# Patient Record
Sex: Female | Born: 2000
Health system: Southern US, Community
[De-identification: ages and names within clinical notes are randomized; demographics above are authoritative.]

## PROBLEM LIST (undated history)

## (undated) DIAGNOSIS — T7840XA Allergy, unspecified, initial encounter: Secondary | ICD-10-CM

## (undated) DIAGNOSIS — F329 Major depressive disorder, single episode, unspecified: Secondary | ICD-10-CM

## (undated) DIAGNOSIS — E282 Polycystic ovarian syndrome: Secondary | ICD-10-CM

## (undated) DIAGNOSIS — F419 Anxiety disorder, unspecified: Secondary | ICD-10-CM

## (undated) DIAGNOSIS — F32A Depression, unspecified: Secondary | ICD-10-CM

## (undated) HISTORY — DX: Allergy, unspecified, initial encounter: T78.40XA

## (undated) HISTORY — DX: Anxiety disorder, unspecified: F41.9

## (undated) HISTORY — DX: Polycystic ovarian syndrome: E28.2

## (undated) HISTORY — PX: WISDOM TOOTH EXTRACTION: SHX21

## (undated) HISTORY — DX: Depression, unspecified: F32.A

## (undated) HISTORY — DX: Major depressive disorder, single episode, unspecified: F32.9

---
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
## 2018-01-02 ENCOUNTER — Ambulatory Visit (INDEPENDENT_AMBULATORY_CARE_PROVIDER_SITE_OTHER): Payer: 59 | Admitting: Psychiatry

## 2018-01-02 ENCOUNTER — Encounter (HOSPITAL_COMMUNITY): Payer: Self-pay | Admitting: Psychiatry

## 2018-01-02 VITALS — BP 118/70 | HR 80 | Ht 66.0 in | Wt 181.0 lb

## 2018-01-02 DIAGNOSIS — F411 Generalized anxiety disorder: Secondary | ICD-10-CM

## 2018-01-02 DIAGNOSIS — Z818 Family history of other mental and behavioral disorders: Secondary | ICD-10-CM | POA: Diagnosis not present

## 2018-01-02 DIAGNOSIS — F900 Attention-deficit hyperactivity disorder, predominantly inattentive type: Secondary | ICD-10-CM

## 2018-01-02 DIAGNOSIS — F332 Major depressive disorder, recurrent severe without psychotic features: Secondary | ICD-10-CM | POA: Diagnosis not present

## 2018-01-02 DIAGNOSIS — Z87891 Personal history of nicotine dependence: Secondary | ICD-10-CM

## 2018-01-02 DIAGNOSIS — Z813 Family history of other psychoactive substance abuse and dependence: Secondary | ICD-10-CM

## 2018-01-02 MED ORDER — LISDEXAMFETAMINE DIMESYLATE 30 MG PO CAPS: ORAL_CAPSULE | ORAL | 0 refills | Status: DC

## 2018-01-02 MED FILL — VYVANSE 30 MG CAPSULE: 30 | 30 days supply | Qty: 30 | Fill #0

## 2018-01-02 NOTE — Progress Notes (Signed)
[facility] MD/PA/NP OP Progress Note  01/02/2018 11:23 AM Cheryl [name]  MRN:  [id number]  Chief Complaint: doing pretty good  HPI: Cheryl [name] reports that she has been more consistent with her Prozac and Vyvanse and her mood does seem to be better.  She is focusing the summer on working on enjoying her relationships, self-care, exercise, journaling.  She has noticed that the withdrawal anxiety at the end of the day is much less when she takes the Vyvanse more consistently on a day-to-day basis.  No acute safety issues, no cutting, no purging, no self injury.  We agreed to follow-up in 8 weeks and continue the current medication regimen as prescribed.  Visit Diagnosis:    ICD-10-CM   1. Severe episode of recurrent major depressive disorder, without psychotic features ([place]) F33.2   2. GAD (generalized anxiety disorder) F41.1   3. Attention deficit hyperactivity disorder (ADHD), predominantly inattentive type F90.0     Past Psychiatric History: See intake H&P for full details. Reviewed, with no updates at this time.   Past Medical History:  Past Medical History:  Diagnosis Date  . Allergy   . Anxiety   . Depression   . PCOS (polycystic ovarian syndrome) 11/2016    Past Surgical History:  Procedure Laterality Date  . WISDOM TOOTH EXTRACTION    . WISDOM TOOTH EXTRACTION      Family Psychiatric History: See intake H&P for full details. Reviewed, with no updates at this time.   Family History:  Family History  Problem Relation Age of Onset  . Depression Mother   . Anxiety disorder Mother   . Hyperlipidemia Mother   . Hypertension Mother   . Hypertension Father   . Depression Maternal Uncle   . Hypertension Maternal Grandmother   . Hypertension Maternal Grandfather   . Drug abuse Maternal Grandfather     Social History:  Social History   Socioeconomic History  . Marital status: Single    Spouse name: n/a  . Number of children: 0  . Years of education: Not on file  .  Highest education level: High school graduate  Occupational History  . 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 Financial resource strain: Not on file  . Food insecurity:    Worry: Never true    Inability: Never true  . Transportation needs:    Medical: No    Non-medical: No  Tobacco Use  . Smoking status: Former Smoker    Last attempt to quit: 10/21/2017    Years since quitting: 0.2  . Smokeless tobacco: Never Used  Substance and Sexual Activity  . Alcohol use: No    Alcohol/week: 0.0 oz  . Drug use: No  . Sexual activity: Never  Lifestyle  . Physical activity:    Days per week: 1 day    Minutes per session: 30 min  . Stress: Rather much  Relationships  . Social connections:    Talks on phone: More than three times a week    Gets together: More than three times a week    Attends religious service: More than 4 times per year    Active member of club or organization: Yes    Attends meetings of clubs or organizations: More than 4 times per year    Relationship status: Never married  Other Topics Concern  . Not on file  Social History Narrative  Lives her mom mostly. Parents are divorced.    Allergies: No Known Allergies  Metabolic Disorder Labs: No results found for: HGBA1C, MPG Lab Results  Component Value Date   PROLACTIN 7.6 11/15/2016   No results found for: CHOL, TRIG, HDL, CHOLHDL, VLDL, LDLCALC Lab Results  Component Value Date   TSH 2.58 11/15/2016    Therapeutic Level Labs: No results found for: LITHIUM No results found for: VALPROATE No components found for:  CBMZ  Current Medications: Current Outpatient Medications  Medication Sig Dispense Refill  . EPINEPHrine 0.3 mg/0.3 mL IJ SOAJ injection   1  . FLUoxetine (PROZAC) 40 MG capsule Take 1 capsule (40 mg total) by mouth daily. 90 capsule 0  . lisdexamfetamine (VYVANSE) 30 MG capsule Take one each morning 90 capsule 0  .  norgestimate-ethinyl estradiol (ORTHO-CYCLEN,SPRINTEC,PREVIFEM) 0.25-35 MG-MCG tablet Take 25 tablets by mouth daily.  12  . traZODone (DESYREL) 50 MG tablet Take 1 or 2 each evening 90 tablet 1  . metFORMIN (GLUCOPHAGE-XR) 500 MG 24 hr tablet Take 1 tablet (500 mg total) by mouth 2 (two) times daily. (Patient not taking: Reported on 10/21/2017) 60 tablet 5  . mupirocin cream (BACTROBAN) 2 % Apply 1 application topically 2 (two) times daily. (Patient not taking: Reported on 10/21/2017) 15 g 0   No current facility-administered medications for this visit.     Musculoskeletal: Strength & Muscle Tone: within normal limits Gait & Station: normal Patient leans: N/A  Psychiatric Specialty Exam: ROS  Blood pressure 118/70, pulse 80, height 5\' 6"  (1.676 m), weight 181 lb (82.1 kg).Body mass index is 29.21 kg/m.  General Appearance: Casual and Well Groomed  Eye Contact:  Good  Speech:  Clear and Coherent and Normal Rate  Volume:  Normal  Mood:  Euthymic  Affect:  Appropriate and Congruent  Thought Process:  Goal Directed and Descriptions of Associations: Intact  Orientation:  Full (Time, Place, and Person)  Thought Content: Logical   Suicidal Thoughts:  No  Homicidal Thoughts:  No  Memory:  Immediate;   Fair  Judgement:  Fair  Insight:  Shallow  Psychomotor Activity:  Normal  Concentration:  Concentration: Good  Recall:  Good  Fund of Knowledge: Good  Language: Good  Akathisia:  Negative  Handed:  Right  AIMS (if indicated): not done  Assets:  Communication Skills Desire for Improvement Financial Resources/Insurance Housing Physical Health Social Support Transportation Vocational/Educational  ADL's:  Intact  Cognition: WNL  Sleep:  Good   Screenings: PHQ2-9     Office Visit from 11/15/2016 in [facility] Office Visit from 09/08/2015 in [facility] Visit from 05/19/2015 in [facility] Counselor from 03/22/2015 in [place] Office Visit from 03/17/2015 in [facility]  PHQ-2 Total Score  0  0  0  4  5  PHQ-9 Total Score  0  -  1  16  14        Assessment and Plan:  Cheryl [name] presents with improved mood and ADHD symptoms, anxiety is well controlled when she is taking the medications more consistently.  No acute safety issues and we will follow-up in 8 weeks or sooner if needed.  1. Severe episode of recurrent major depressive disorder, without psychotic features ([place])   2. GAD (generalized anxiety disorder)   3. Attention deficit hyperactivity disorder (ADHD), predominantly inattentive type     Status of current problems: unchanged  Labs Ordered: No orders of the defined types were placed in this encounter.   Labs Reviewed: n/a  Collateral Obtained/Records Reviewed: Viewed prior psychiatric notes  Plan:  Continue Vyvanse 30 mg daily Continue Prozac 40 mg daily Trazodone as needed for sleep  Aundra Dubin, MD 01/02/2018, 11:23 AM

## 2018-01-10 MED FILL — FEMYNOR 0.25-35 MG-MCG TABS: 0.25-35 | 28 days supply | Qty: 28 | Fill #1

## 2018-01-22 ENCOUNTER — Ambulatory Visit (HOSPITAL_COMMUNITY): Payer: Self-pay | Admitting: Licensed Clinical Social Worker

## 2018-01-22 ENCOUNTER — Encounter

## 2018-02-10 ENCOUNTER — Encounter (HOSPITAL_COMMUNITY): Payer: Self-pay | Admitting: Licensed Clinical Social Worker

## 2018-02-10 ENCOUNTER — Ambulatory Visit (INDEPENDENT_AMBULATORY_CARE_PROVIDER_SITE_OTHER): Payer: 59 | Admitting: Licensed Clinical Social Worker

## 2018-02-10 ENCOUNTER — Encounter

## 2018-02-10 DIAGNOSIS — F411 Generalized anxiety disorder: Secondary | ICD-10-CM | POA: Diagnosis not present

## 2018-02-10 DIAGNOSIS — F332 Major depressive disorder, recurrent severe without psychotic features: Secondary | ICD-10-CM | POA: Diagnosis not present

## 2018-02-10 DIAGNOSIS — F900 Attention-deficit hyperactivity disorder, predominantly inattentive type: Secondary | ICD-10-CM | POA: Diagnosis not present

## 2018-02-10 NOTE — Progress Notes (Signed)
   THERAPIST PROGRESS NOTE  Session Time: 9-10  Participation Level: Active  Behavioral Response: Casual and Well GroomedAlertAnxious and Euthymic  Type of Therapy: Individual Therapy  Treatment Goals addressed: Anxiety  Interventions: CBT  Summary: AIRIKA ALKHATIB is a 17 y.o. female who presents with GAD and hx of depression. Subjective: "I want to work on my consistency... I can be extremely up and then down w/ my energy. I'm a type 6 on Enneagram."  Pt made strong eye contact, w/ blunted affect, and euthymic mood throughout session. She was sarcastic, joking, and self deprecating in session. She was unaware of what she wanted to work on but she stated she "did not want to cry", which she admitted was easy for her to do. Counselor spent time asking about pt's school choices, friend choices, and Enneagram personality type 6, which she stated was very important to her. Pt discussed a recent exchange w/ a friend who did not check on her when pt did not attend a social gathering on Saturday to go go-karting. Pt admitted this was somewhat hurtful to her and she felt "inadequate". Counselor asked pt to decide on 1 goal for therapy. Pt and counselor made goal of increasing boundaries for taking care of self.  Suicidal/Homicidal: Nowithout intent/plan  Therapist Response: Counselor used CBT to help pt identify her thoughts that trigger anxiety. Counselor validated pt's long standing hx of depressive and anxious thinking. Pt appears to have strong insight into her dysfunctional thinking though she lacks the skills to implement serene thinking and tolerate distress of social anxiety.  Plan: Return again in 2 weeks.  Diagnosis:    ICD-10-CM   1. Severe episode of recurrent major depressive disorder, without psychotic features ([place]) F33.2   2. GAD (generalized anxiety disorder) F41.1   3. Attention deficit hyperactivity disorder (ADHD), predominantly inattentive type F90.0       Archie Balboa, LCAS-A 02/10/2018

## 2018-02-24 MED FILL — FLUoxetine HCL 40 MG CAPS: 40 | 30 days supply | Qty: 30 | Fill #0

## 2018-02-28 ENCOUNTER — Ambulatory Visit: Payer: 59 | Admitting: Physician Assistant

## 2018-02-28 ENCOUNTER — Other Ambulatory Visit: Payer: Self-pay

## 2018-02-28 ENCOUNTER — Encounter: Payer: Self-pay | Admitting: Physician Assistant

## 2018-02-28 VITALS — BP 118/80 | HR 74 | Temp 97.6°F | Resp 15 | Ht 65.75 in | Wt 181.8 lb

## 2018-02-28 DIAGNOSIS — H6983 Other specified disorders of Eustachian tube, bilateral: Secondary | ICD-10-CM

## 2018-02-28 MED ORDER — FEXOFENADINE-PSEUDOEPHED ER 180-240 MG PO TB24: 1.0000 | ORAL_TABLET | Freq: Every day | ORAL | 0 refills | Status: DC

## 2018-02-28 MED ORDER — FLUTICASONE PROPIONATE 50 MCG/ACT NA SUSP: 2.0000 | Freq: Every day | NASAL | 6 refills | Status: DC

## 2018-02-28 MED FILL — VYVANSE 30 MG CAPSULE: 30 | 30 days supply | Qty: 30 | Fill #0

## 2018-02-28 MED FILL — FLUTICASONE PROP 50 MCG SPR: 50 | 30 days supply | Qty: 16 | Fill #0

## 2018-02-28 NOTE — Progress Notes (Signed)
Patient presents to clinic today c/o 4 days of progressively worsening muffled hearing, ear pressure and ear pain. Denies sinus pain but notes some mild nasal congestion. Denies chest congestion or cough. Denies fever, chills. Has significant history of ear infections. Denies recent travel or sick contact. R is worse than L. Has not taken anything for symptoms.    Past Medical History:  Diagnosis Date  . Allergy   . Anxiety   . Depression   . PCOS (polycystic ovarian syndrome) 11/2016    Current Outpatient Medications on File Prior to Visit  Medication Sig Dispense Refill  . EPINEPHrine 0.3 mg/0.3 mL IJ SOAJ injection   1  . FLUoxetine (PROZAC) 40 MG capsule Take 1 capsule (40 mg total) by mouth daily. 90 capsule 0  . lisdexamfetamine (VYVANSE) 30 MG capsule Take one each morning 90 capsule 0  . mupirocin cream (BACTROBAN) 2 % Apply 1 application topically 2 (two) times daily. 15 g 0  . norgestimate-ethinyl estradiol (ORTHO-CYCLEN,SPRINTEC,PREVIFEM) 0.25-35 MG-MCG tablet Take 25 tablets by mouth daily.  12  . metFORMIN (GLUCOPHAGE-XR) 500 MG 24 hr tablet Take 1 tablet (500 mg total) by mouth 2 (two) times daily. (Patient not taking: Reported on 10/21/2017) 60 tablet 5  . traZODone (DESYREL) 50 MG tablet Take 1 or 2 each evening (Patient not taking: Reported on 02/28/2018) 90 tablet 1   No current facility-administered medications on file prior to visit.     No Known Allergies  Family History  Problem Relation Age of Onset  . Depression Mother   . Anxiety disorder Mother   . Hyperlipidemia Mother   . Hypertension Mother   . Hypertension Father   . Depression Maternal Uncle   . Hypertension Maternal Grandmother   . Hypertension Maternal Grandfather   . Drug abuse Maternal Grandfather     Social History   Socioeconomic History  . Marital status: Single    Spouse name: n/a  . Number of children: 0  . Years of education: Not on file  . Highest education level: High school  graduate  Occupational History  . 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 Financial resource strain: Not on file  . Food insecurity:    Worry: Never true    Inability: Never true  . Transportation needs:    Medical: No    Non-medical: No  Tobacco Use  . Smoking status: Former Smoker    Last attempt to quit: 10/21/2017    Years since quitting: 0.3  . Smokeless tobacco: Never Used  Substance and Sexual Activity  . Alcohol use: No    Alcohol/week: 0.0 standard drinks  . Drug use: No  . Sexual activity: Never  Lifestyle  . Physical activity:    Days per week: 1 day    Minutes per session: 30 min  . Stress: Rather much  Relationships  . Social connections:    Talks on phone: More than three times a week    Gets together: More than three times a week    Attends religious service: More than 4 times per year    Active member of club or organization: Yes    Attends meetings of clubs or organizations: More than 4 times per year    Relationship status: Never married  Other Topics Concern  . Not on file  Social History Narrative   Lives her mom mostly. Parents are divorced.  Review of Systems - See HPI.  All other ROS are negative.  BP 118/80   Pulse 74   Temp 97.6 F (36.4 C) (Oral)   Resp 15   Ht 5' 5.75" (1.67 m)   Wt 181 lb 12.8 oz (82.5 kg)   SpO2 98%   BMI 29.57 kg/m   Physical Exam  Constitutional: She appears well-developed and well-nourished.  HENT:  Head: Normocephalic and atraumatic.  Right Ear: External ear normal. Tympanic membrane is retracted.  Left Ear: External ear normal. Tympanic membrane is retracted.  Nose: Nose normal.  Mouth/Throat: Oropharynx is clear and moist.  Eyes: Conjunctivae are normal.  Neck: Neck supple.  Cardiovascular: Normal rate, regular rhythm, normal heart sounds and intact distal pulses.  Pulmonary/Chest: Effort normal and breath sounds normal. No  respiratory distress.  Psychiatric: She has a normal mood and affect.  Vitals reviewed.  Assessment/Plan: 1. Eustachian tube dysfunction, bilateral Moderate retraction bilaterally. No noted effusion. Patient afebrile. Will start antihistamine with decongestant, as well as nasal steroid. Supportive measures reviewed. Follow-up if not resolving.   - fluticasone (FLONASE) 50 MCG/ACT nasal spray; Place 2 sprays into both nostrils daily.  Dispense: 16 g; Refill: 6 - fexofenadine-pseudoephedrine (ALLEGRA-D ALLERGY & CONGESTION) 180-240 MG 24 hr tablet; Take 1 tablet by mouth daily.  Dispense: 20 tablet; Refill: 0   Leeanne Rio, PA-C

## 2018-02-28 NOTE — Patient Instructions (Signed)
No sign of infection today but you ear drums are really retracted. Start the Allegra-D and Flonase as directed. Symptoms should slowly resolve. If not improving by Monday or anything worsens, please call or come see [place].

## 2018-03-20 ENCOUNTER — Ambulatory Visit (HOSPITAL_COMMUNITY): Payer: Self-pay | Admitting: Licensed Clinical Social Worker

## 2018-04-02 DIAGNOSIS — Z6829 Body mass index (BMI) 29.0-29.9, adult: Secondary | ICD-10-CM | POA: Diagnosis not present

## 2018-04-02 DIAGNOSIS — Z01419 Encounter for gynecological examination (general) (routine) without abnormal findings: Secondary | ICD-10-CM | POA: Diagnosis not present

## 2018-04-03 ENCOUNTER — Ambulatory Visit (INDEPENDENT_AMBULATORY_CARE_PROVIDER_SITE_OTHER): Payer: 59 | Admitting: Licensed Clinical Social Worker

## 2018-04-03 ENCOUNTER — Encounter (HOSPITAL_COMMUNITY): Payer: Self-pay | Admitting: Licensed Clinical Social Worker

## 2018-04-03 DIAGNOSIS — F332 Major depressive disorder, recurrent severe without psychotic features: Secondary | ICD-10-CM

## 2018-04-03 DIAGNOSIS — F411 Generalized anxiety disorder: Secondary | ICD-10-CM

## 2018-04-03 NOTE — Progress Notes (Signed)
   THERAPIST PROGRESS NOTE  Session Time: 10-11  Participation Level: Active  Behavioral Response: Casual and Well GroomedAlertEuthymic  Type of Therapy: Individual Therapy  Treatment Goals addressed: Anxiety and Coping  Interventions: CBT  Summary: Cheryl [name] is a 17 y.o. female who presents with MDD and GAD.  "I don't understand my emotions, like, how to process them. I'm good w/ the extremes of sad or happy but not the in between ones."  Pt was active, engaged in session. She laid down on couch horizontally and stated she wanted to "do therapy like the classic way". She reported on her distress that she has yet to establish a major, though she is leaning towards Risk analyst. She has decided she will no longer pursue social work or Physicist, medical. Pt and counselor discuss pt's emotional states and how she can learn to allow herself to process more varied emotions than just depressed or happy. Counselor presents pt w/ handout on emotional states and cognitive therapy. Counselor challenges pt to consider that "she cannot make someone else feel a certain way." Pt has recently found a new friend group and she feels "happier than she ever has" w/ these friends, though the guilt of leaving others out keeps her from fully enjoying it.   Suicidal/Homicidal: Nowithout intent/plan  Therapist Response: Counselor used open questions, active listening, and supportive reframing. Counselor used brief psychoeducation to inform pt of her inability to "cause" others to feel certain things. Counselor utilized handouts and cognitive model of therapy to educate and build insight into pt's behavioral and emotional link to her underlying beliefs about a situation.   Plan: Return again in 4 weeks.  Diagnosis:    ICD-10-CM   1. Severe episode of recurrent major depressive disorder, without psychotic features ([place]) F33.2   2. GAD (generalized anxiety disorder) F41.1        Archie Balboa,  LCAS-A 04/03/2018

## 2018-04-17 ENCOUNTER — Encounter (HOSPITAL_COMMUNITY): Payer: Self-pay | Admitting: Licensed Clinical Social Worker

## 2018-04-17 ENCOUNTER — Ambulatory Visit (INDEPENDENT_AMBULATORY_CARE_PROVIDER_SITE_OTHER): Payer: 59 | Admitting: Licensed Clinical Social Worker

## 2018-04-17 DIAGNOSIS — F411 Generalized anxiety disorder: Secondary | ICD-10-CM | POA: Diagnosis not present

## 2018-04-17 DIAGNOSIS — F332 Major depressive disorder, recurrent severe without psychotic features: Secondary | ICD-10-CM

## 2018-04-17 NOTE — Progress Notes (Signed)
   THERAPIST PROGRESS NOTE  Session Time: 10-11  Participation Level: Active  Behavioral Response: CasualAlertAnxious  Type of Therapy: Individual Therapy  Treatment Goals addressed: Anxiety  Interventions: CBT and Supportive  Summary: Cheryl [name] is a 17 y.o. female who presents with GAD and MDD.   "I'm going to [place] this weekend w/ some friends and I am so worried about my emotions getting in the way of me having fun."  Pt is active, engaged, and talkative in session. She allows for significant moments of silence and appears to gain deep insight into her problematic self judgment, inner critic, and insecurity. She reports on her problems w/ feeling that her friends will accept her. Counselor provides pt with a "feelings wheel" to help pt grow her emotional vocabulary.   Suicidal/Homicidal: Nowithout intent/plan  Therapist Response: Counselor used open questions, active listening, emotional support, and emotional validation. Counselor helped pt increase her emotional vocabulary and stay w/ her feelings of insecurity w/o distracting herself w/ intellectualizing.   Plan: Return again in 2 weeks.  Diagnosis:    ICD-10-CM   1. Severe episode of recurrent major depressive disorder, without psychotic features ([place]) F33.2   2. GAD (generalized anxiety disorder) F41.1        Archie Balboa, LCAS-A 04/17/2018

## 2018-05-14 ENCOUNTER — Encounter (HOSPITAL_COMMUNITY): Payer: Self-pay | Admitting: Licensed Clinical Social Worker

## 2018-05-14 ENCOUNTER — Ambulatory Visit (INDEPENDENT_AMBULATORY_CARE_PROVIDER_SITE_OTHER): Payer: 59 | Admitting: Licensed Clinical Social Worker

## 2018-05-14 DIAGNOSIS — F332 Major depressive disorder, recurrent severe without psychotic features: Secondary | ICD-10-CM

## 2018-05-14 DIAGNOSIS — F411 Generalized anxiety disorder: Secondary | ICD-10-CM | POA: Diagnosis not present

## 2018-05-14 DIAGNOSIS — F900 Attention-deficit hyperactivity disorder, predominantly inattentive type: Secondary | ICD-10-CM | POA: Diagnosis not present

## 2018-05-14 NOTE — Progress Notes (Signed)
   THERAPIST PROGRESS NOTE  Session Time: 3-4pm  Participation Level: Active  Behavioral Response: Casual and NeatAlertAnxious  Type of Therapy: Individual Therapy  Treatment Goals addressed: Anxiety  Interventions: CBT  Summary: Cheryl [name] is a 17 y.o. female who presents with GAD and hx of MDD and ADHD.  Pt is engaged and alert in session. She is lucid, joking, and somewhat nervous as she lays down on the couch. Pt reports she "feels like she is going to cry in today's session." Counselor and pt discuss pt's somatic experience of anxiety about possibly crying. Pt discusses her ongoing conflict w/ her roommates. Pt feels she is "the outsider" roommate who does not spend enough time w/ the other roommates. Counselor and pt discuss ways to challenge automatic negative thoughts.    Suicidal/Homicidal: Nowithout intent/plan  Therapist Response: Counselor helped pt identify and challenge automatic negative core beliefs. Counselor helped pt articulate ways to challenge irrational "shoulds" in her life. Counselor encouraged pt for her positive trip to [facility] and skills she used to manage anxiety of [place].  Plan: Return again in 2 weeks.  Diagnosis:    ICD-10-CM   1. Severe episode of recurrent major depressive disorder, without psychotic features ([place]) F33.2   2. GAD (generalized anxiety disorder) F41.1   3. Attention deficit hyperactivity disorder (ADHD), predominantly inattentive type F90.0       Archie Balboa, LCAS-A 05/14/2018

## 2018-05-26 ENCOUNTER — Ambulatory Visit (INDEPENDENT_AMBULATORY_CARE_PROVIDER_SITE_OTHER): Payer: 59 | Admitting: Licensed Clinical Social Worker

## 2018-05-26 DIAGNOSIS — F332 Major depressive disorder, recurrent severe without psychotic features: Secondary | ICD-10-CM | POA: Diagnosis not present

## 2018-05-26 DIAGNOSIS — F411 Generalized anxiety disorder: Secondary | ICD-10-CM | POA: Diagnosis not present

## 2018-05-30 ENCOUNTER — Encounter (HOSPITAL_COMMUNITY): Payer: Self-pay | Admitting: Licensed Clinical Social Worker

## 2018-05-30 NOTE — Progress Notes (Signed)
   THERAPIST PROGRESS NOTE  Session Time: 10-11  Participation Level: Active  Behavioral Response: Well GroomedAlertEuthymic  Type of Therapy: Individual Therapy  Treatment Goals addressed: Anxiety  Interventions: CBT and Meditation: Brainspotting  Summary: Cheryl [name] is a 17 y.o. female who presents with MDD and GAD.  She is engaged, calm, alert in session. Mood is labile w/ some tearfulness. She reports her relationships w/ roommates and friends are improving. She states she is practicing mindful breathing and noticing her thoughts and feelings w/o acting on them. Pt and counselor scan pt's field of vision using Brainspotting-style processing technique. Pt states she feels agitated and insecure when looking down. Pt discussed feeling different since kindergarden and comparing herself to her older sisters who had better relationships w/ their teachers.   Suicidal/Homicidal: Nowithout intent/plan  Therapist Response: Counselor used open questions, active listening, and supportive encouragement. Counselor congratulated pt on accomplishments of utilizing mindfulness skills that she practiced in previous sessions. Counselor used an adapted form of brainspotting to deepen pt's somatic awareness. Pt responded positively to this but initially appeared agitated.   Plan: Return again in 2 weeks.  Diagnosis:    ICD-10-CM   1. Severe episode of recurrent major depressive disorder, without psychotic features ([place]) F33.2   2. GAD (generalized anxiety disorder) F41.1       Archie Balboa, LCAS-A 05/30/2018

## 2018-06-16 ENCOUNTER — Ambulatory Visit (INDEPENDENT_AMBULATORY_CARE_PROVIDER_SITE_OTHER): Payer: 59 | Admitting: Licensed Clinical Social Worker

## 2018-06-16 DIAGNOSIS — F411 Generalized anxiety disorder: Secondary | ICD-10-CM | POA: Diagnosis not present

## 2018-06-16 DIAGNOSIS — F332 Major depressive disorder, recurrent severe without psychotic features: Secondary | ICD-10-CM

## 2018-06-17 ENCOUNTER — Encounter (HOSPITAL_COMMUNITY): Payer: Self-pay | Admitting: Licensed Clinical Social Worker

## 2018-06-17 NOTE — Progress Notes (Signed)
   THERAPIST PROGRESS NOTE  Session Time: 10-11  Participation Level: Active  Behavioral Response: Well GroomedAlertEuthymic    Type of Therapy: Individual Therapy  Treatment Goals addressed: Anxiety  Interventions: CBT  Summary: Cheryl [name] is a 17 y.o. female who presents with MDD and GAD.  She presents as calm, alert, she makes good eye contact, and appears comfortable. She spends majority of sessions discussing her interpersonal problems w/ a new boyfriend and guilt she feels since he has been with someone else the last few months. Pt feels that she may have had an "emotional affair". Counselor reframes pt's meaning-making of events. Pt leaves feeling relieved. Pt continues to do well interpersonally and academically.   Suicidal/Homicidal: Nowithout intent/plan  Therapist Response: Counselor used open questions, active listening, and CBT reframing. Counselor helped pt challenge automatic negative thoughts. Pt continues to struggle w/ excessive guilt from "others judging her and making people feel bad". She is working on individualizing her spiritual journey and admits she is learning to listen to God, more than her friends opinions.   Plan: Return again in 2 weeks.  Diagnosis:    ICD-10-CM   1. Severe episode of recurrent major depressive disorder, without psychotic features ([place]) F33.2   2. GAD (generalized anxiety disorder) F41.1       Archie Balboa, LCAS-A 06/17/2018

## 2018-07-02 MED FILL — VYVANSE 30 MG CAPSULE: 30 | 30 days supply | Qty: 30 | Fill #0

## 2018-07-02 MED FILL — FLUoxetine HCL 40 MG CAPS: 40 | 30 days supply | Qty: 30 | Fill #0

## 2018-07-02 MED FILL — NUVARING VAGINAL RING: 0.12-0.015 | 28 days supply | Qty: 1 | Fill #0

## 2018-07-16 DIAGNOSIS — Z23 Encounter for immunization: Secondary | ICD-10-CM | POA: Diagnosis not present

## 2018-08-04 MED FILL — FLUoxetine HCL 40 MG CAPS: 40 | 30 days supply | Qty: 30 | Fill #1

## 2018-08-04 MED FILL — NUVARING VAGINAL RING: 0.12-0.015 | 28 days supply | Qty: 1 | Fill #1

## 2018-08-05 MED FILL — VYVANSE 30 MG CAPSULE: 30 | 30 days supply | Qty: 30 | Fill #0

## 2018-08-22 MED FILL — CONCERTA 18 MG TABLET ER: 18 | 30 days supply | Qty: 30 | Fill #0

## 2018-09-02 MED FILL — FLUoxetine HCL 40 MG CAPS: 40 | 30 days supply | Qty: 30 | Fill #2

## 2018-09-02 MED FILL — NUVARING VAGINAL RING: 0.12-0.015 | 28 days supply | Qty: 1 | Fill #2

## 2018-09-11 DIAGNOSIS — M25562 Pain in left knee: Secondary | ICD-10-CM | POA: Diagnosis not present

## 2018-09-11 DIAGNOSIS — M25362 Other instability, left knee: Secondary | ICD-10-CM | POA: Diagnosis not present

## 2018-09-22 MED FILL — FLUoxetine HCL 40 MG CAPS: 40 | 30 days supply | Qty: 30 | Fill #1

## 2018-09-22 MED FILL — CONCERTA 18 MG TABLET ER: 18 | 30 days supply | Qty: 30 | Fill #0

## 2018-10-23 DIAGNOSIS — M25562 Pain in left knee: Secondary | ICD-10-CM | POA: Diagnosis not present

## 2018-10-27 DIAGNOSIS — M25562 Pain in left knee: Secondary | ICD-10-CM | POA: Diagnosis not present

## 2018-11-06 DIAGNOSIS — M25562 Pain in left knee: Secondary | ICD-10-CM | POA: Diagnosis not present

## 2018-11-07 MED FILL — CONCERTA ER 27 MG TABLET: 27 | 30 days supply | Qty: 30 | Fill #0

## 2018-11-07 MED FILL — NUVARING VAGINAL RING: 0.12-0.015 | 28 days supply | Qty: 1 | Fill #3

## 2018-11-11 DIAGNOSIS — M25562 Pain in left knee: Secondary | ICD-10-CM | POA: Diagnosis not present

## 2018-12-05 MED FILL — CONCERTA ER 27 MG TABLET: 27 | 30 days supply | Qty: 30 | Fill #0

## 2018-12-05 MED FILL — NUVARING VAGINAL RING: 0.12-0.015 | 28 days supply | Qty: 1 | Fill #4

## 2019-01-07 ENCOUNTER — Telehealth: Payer: Self-pay | Admitting: Family Medicine

## 2019-01-07 NOTE — Telephone Encounter (Signed)
Called in to follow up about a EKG order from her Psychiatrist? Pt says that she was told by Dr that she would be getting contacted by PCP office to schedule?   Would like to know if it is time for her to have a visit with PCP?   CB: [phone]

## 2019-01-08 NOTE — Telephone Encounter (Signed)
Called pt to verify what EKG is needed to be completed for? Also to advise pt would need an in office appt with PCP as she has not been seen in 2 years. VM was full.

## 2019-01-08 NOTE — Telephone Encounter (Signed)
I have not seen any orders and pt would need to schedule a visit w/ me as I have not seen her since May 2018

## 2019-01-08 NOTE — Telephone Encounter (Signed)
Have you received any paperwork? Pt has not seen you since 2018

## 2019-01-09 NOTE — Telephone Encounter (Signed)
Called pt again this morning and VM was still full.

## 2019-01-09 NOTE — Telephone Encounter (Signed)
I spoke with patient regarding her request for an appt for an EKG. Patient stated that Dr. Sharyon Medicus with [facility] is wanting an EKG performed due to her resting heart rate being too high. I informed patient that there were no orders in her chart for an EKG. I went ahead and scheduled patient for a routine f/u with Dr. Birdie Riddle since patient has not been seen in over two years. Informed patient to reach out to Dr. Sharyon Medicus office to have orders placed for EKG.

## 2019-01-16 ENCOUNTER — Ambulatory Visit: Payer: 59 | Admitting: Family Medicine

## 2019-01-16 ENCOUNTER — Other Ambulatory Visit: Payer: Self-pay

## 2019-01-16 ENCOUNTER — Encounter: Payer: Self-pay | Admitting: Family Medicine

## 2019-01-16 VITALS — BP 112/78 | HR 104 | Temp 98.1°F | Resp 16 | Ht 66.0 in | Wt 168.4 lb

## 2019-01-16 DIAGNOSIS — F331 Major depressive disorder, recurrent, moderate: Secondary | ICD-10-CM

## 2019-01-16 DIAGNOSIS — R000 Tachycardia, unspecified: Secondary | ICD-10-CM

## 2019-01-16 NOTE — Patient Instructions (Signed)
We'll call you with your Cardiology appt We'll notify you of your lab results and make any changes if needed Please ask Psych to decrease your Concerta to your previous dose to see if that makes a difference Try and limit your sugar and caffeine If you again develop chest tightness, dizziness, or shortness of breath- please go to the ER Call with any questions or concerns Hang in there!!

## 2019-01-16 NOTE — Progress Notes (Signed)
   Subjective:    Patient ID: Cheryl [name], female    DOB: 19-Jan-2001, 18 y.o.   MRN: [id number]  HPI Tachycardia- pt reports she will 'get random high heart rate notifications'.  Over the last month has 'noticed it more'.  Concerta was recently increased.  HR in PT was up to 186- she got dizzy, some shortness of breath.  At times will develop chest tightness.  Pt is on Concerta but has never had tachycardia w/ stimulants in the past.  Pt reports HR will elevate even on days she doesn't take Concerta.  Not currently on decongestant.  Denies recent stressors.  In Spring of 2019 she was not able to finish an exercise class due to chest pain but has not had trouble until again recently.  Pt denies redness, flushing, sweating.   Review of Systems For ROS see HPI     Objective:   Physical Exam Vitals signs reviewed.  Constitutional:      General: She is not in acute distress.    Appearance: She is well-developed.  HENT:     Head: Normocephalic and atraumatic.  Eyes:     Conjunctiva/sclera: Conjunctivae normal.     Pupils: Pupils are equal, round, and reactive to light.  Neck:     Musculoskeletal: Normal range of motion and neck supple.     Thyroid: No thyromegaly.  Cardiovascular:     Rate and Rhythm: Regular rhythm. Tachycardia present.     Heart sounds: Normal heart sounds. No murmur.  Pulmonary:     Effort: Pulmonary effort is normal. No respiratory distress.     Breath sounds: Normal breath sounds.  Lymphadenopathy:     Cervical: No cervical adenopathy.  Skin:    General: Skin is warm and dry.  Neurological:     Mental Status: She is alert and oriented to person, place, and time.  Psychiatric:        Behavior: Behavior normal.           Assessment & Plan:  Tachycardia- pt reports she had 1 episode last spring (that was not evaluated) but was relatively asymptomatic until ~1 month ago.  She was started on Concerta this spring and dose was recently increased.  EKG WNL  w/ exception of HR 102.  It is unclear if her sxs are stimulant related but the fact that she is having HR of 140 while at rest, watching TV is unusual.  Will check labs to r/o metabolic cause and refer to Cards for complete evaluation.  Pt expressed understanding and is in agreement w/ plan.

## 2019-01-17 LAB — CBC WITH DIFFERENTIAL/PLATELET
Absolute Monocytes: 537 cells/uL (ref 200–950)
Basophils Absolute: 47 cells/uL (ref 0–200)
Basophils Relative: 0.6 %
Eosinophils Absolute: 63 cells/uL (ref 15–500)
Eosinophils Relative: 0.8 %
HCT: 41 % (ref 35.0–45.0)
Hemoglobin: 13.3 g/dL (ref 11.7–15.5)
Lymphs Abs: 1991 cells/uL (ref 850–3900)
MCH: 27 pg (ref 27.0–33.0)
MCHC: 32.4 g/dL (ref 32.0–36.0)
MCV: 83.2 fL (ref 80.0–100.0)
MPV: 12 fL (ref 7.5–12.5)
Monocytes Relative: 6.8 %
Neutro Abs: 5261 cells/uL (ref 1500–7800)
Neutrophils Relative %: 66.6 %
Platelets: 257 10*3/uL (ref 140–400)
RBC: 4.93 10*6/uL (ref 3.80–5.10)
RDW: 13.2 % (ref 11.0–15.0)
Total Lymphocyte: 25.2 %
WBC: 7.9 10*3/uL (ref 3.8–10.8)

## 2019-01-17 LAB — BASIC METABOLIC PANEL
BUN: 12 mg/dL (ref 7–20)
CO2: 19 mmol/L — ABNORMAL LOW (ref 20–32)
Calcium: 9.3 mg/dL (ref 8.9–10.4)
Chloride: 105 mmol/L (ref 98–110)
Creat: 0.85 mg/dL (ref 0.50–1.00)
Glucose, Bld: 96 mg/dL (ref 65–99)
Potassium: 3.9 mmol/L (ref 3.8–5.1)
Sodium: 139 mmol/L (ref 135–146)

## 2019-01-17 LAB — TSH: TSH: 2.74 mIU/L

## 2019-01-30 ENCOUNTER — Encounter: Payer: Self-pay | Admitting: *Deleted

## 2019-04-07 MED FILL — FLUoxetine HCL 40 MG CAPS: 40 | 30 days supply | Qty: 30 | Fill #0

## 2019-04-07 MED FILL — NUVARING VAGINAL RING: 0.12-0.015 | 28 days supply | Qty: 1 | Fill #0

## 2019-04-13 ENCOUNTER — Encounter: Payer: Self-pay | Admitting: Cardiology

## 2019-04-13 ENCOUNTER — Other Ambulatory Visit: Payer: Self-pay

## 2019-04-13 ENCOUNTER — Ambulatory Visit (INDEPENDENT_AMBULATORY_CARE_PROVIDER_SITE_OTHER): Payer: 59 | Admitting: Cardiology

## 2019-04-13 ENCOUNTER — Other Ambulatory Visit (INDEPENDENT_AMBULATORY_CARE_PROVIDER_SITE_OTHER): Payer: 59

## 2019-04-13 VITALS — BP 124/88 | HR 87 | Ht 66.0 in | Wt 177.0 lb

## 2019-04-13 DIAGNOSIS — R002 Palpitations: Secondary | ICD-10-CM

## 2019-04-13 NOTE — Progress Notes (Signed)
Cardiology Office Note:    Date:  04/13/2019   ID:  Cheryl [name], DOB July 06, 2000, MRN [id number]  PCP:  Midge Minium, MD  Cardiologist:  Jenean Lindau, MD   Referring MD: Midge Minium, MD    ASSESSMENT:    1. Palpitations    PLAN:    In order of problems listed above:  1. Palpitations: I reassured the patient about my findings.  It is possible that the medication contributed to her palpitations but she is already off the medication and her heart rate is much better.  Her recent TSH was unremarkable and to evaluate this we will do a 3-day ZIO monitor. 2. Echocardiogram will be done to assess murmur heard on auscultation.  I reassured the patient about my findings.  She knows to go to the nearest emergency room for any concerning symptoms. 3. Patient will be seen in follow-up appointment in 6 months or earlier if the patient has any concerns    Medication Adjustments/Labs and Tests Ordered: Current medicines are reviewed at length with the patient today.  Concerns regarding medicines are outlined above.  Orders Placed This Encounter  Procedures  . LONG TERM MONITOR (3-14 DAYS)  . ECHOCARDIOGRAM COMPLETE   No orders of the defined types were placed in this encounter.    History of Present Illness:    Cheryl [name] is a 18 y.o. female who is being seen today for the evaluation of palpitations and elevated heart rate at the request of Birdie Riddle, Aundra Millet, MD.  Patient is a pleasant 18 year old female.  She has no significant past medical history except polycystic ovarian disease and depression.  She was initiated on a medicine called Concerta and she mentions to me that her heart rate was elevated subsequently.  Then this medication was discontinued and she feels much better at this time.  No chest pain orthopnea or PND.  At the time of my evaluation, the patient is alert awake oriented and in no distress.  She is a very active lady.  Past Medical History:   Diagnosis Date  . Allergy   . Anxiety   . Depression   . PCOS (polycystic ovarian syndrome) 11/2016    Past Surgical History:  Procedure Laterality Date  . WISDOM TOOTH EXTRACTION    . WISDOM TOOTH EXTRACTION      Current Medications: Current Meds  Medication Sig  . EPINEPHrine 0.3 mg/0.3 mL IJ SOAJ injection   . fexofenadine-pseudoephedrine (ALLEGRA-D ALLERGY & CONGESTION) 180-240 MG 24 hr tablet Take 1 tablet by mouth daily.  . fluticasone (FLONASE) 50 MCG/ACT nasal spray Place 2 sprays into both nostrils daily.  Marland Kitchen NUVARING 0.12-0.015 MG/24HR vaginal ring      Allergies:   Patient has no known allergies.   Social History   Socioeconomic History  . Marital status: Single    Spouse name: n/a  . Number of children: 0  . Years of education: Not on file  . Highest education level: High school graduate  Occupational History  . Occupation: Lexicographer:  minor    Comment: [place] [employer], mining at [employer]  . Financial resource strain: Not on file  . Food insecurity    Worry: Never true    Inability: Never true  . Transportation needs    Medical: No    Non-medical: No  Tobacco Use  . Smoking status: Former Smoker    Quit date: 10/21/2017    Years  since quitting: 1.4  . Smokeless tobacco: Never Used  Substance and Sexual Activity  . Alcohol use: No    Alcohol/week: 0.0 standard drinks  . Drug use: No  . Sexual activity: Never  Lifestyle  . Physical activity    Days per week: 1 day    Minutes per session: 30 min  . Stress: Rather much  Relationships  . Social connections    Talks on phone: More than three times a week    Gets together: More than three times a week    Attends religious service: More than 4 times per year    Active member of club or organization: Yes    Attends meetings of clubs or organizations: More than 4 times per year    Relationship status: Never married  Other Topics Concern  . Not on file  Social History  Narrative   Lives her mom mostly. Parents are divorced.     Family History: The patient's family history includes Anxiety disorder in her mother; Depression in her maternal uncle and mother; Drug abuse in her maternal grandfather; Hyperlipidemia in her mother; Hypertension in her father, maternal grandfather, maternal grandmother, and mother.  ROS:   Please see the history of present illness.    All other systems reviewed and are negative.  EKGs/Labs/Other Studies Reviewed:    The following studies were reviewed today: EKG reveals sinus rhythm and nonspecific ST-T changes.   Recent Labs: 01/16/2019: BUN 12; Creat 0.85; Hemoglobin 13.3; Platelets 257; Potassium 3.9; Sodium 139; TSH 2.74  Recent Lipid Panel No results found for: CHOL, TRIG, HDL, CHOLHDL, VLDL, LDLCALC, LDLDIRECT  Physical Exam:    VS:  BP 124/88 (BP Location: Left Arm, Patient Position: Sitting, Cuff Size: Normal)   Pulse 87   Ht 5\' 6"  (1.676 m)   Wt 177 lb (80.3 kg)   SpO2 98%   BMI 28.57 kg/m     Wt Readings from Last 3 Encounters:  04/13/19 177 lb (80.3 kg)  01/16/19 168 lb 6 oz (76.4 kg) (91 %, Z= 1.34)*  02/28/18 181 lb 12.8 oz (82.5 kg) (95 %, Z= 1.66)*   * Growth percentiles are based on CDC (Girls, 2-20 Years) data.     GEN: Patient is in no acute distress HEENT: Normal NECK: No JVD; No carotid bruits LYMPHATICS: No lymphadenopathy CARDIAC: S1 S2 regular, 2/6 systolic murmur at the apex. RESPIRATORY:  Clear to auscultation without rales, wheezing or rhonchi  ABDOMEN: Soft, non-tender, non-distended MUSCULOSKELETAL:  No edema; No deformity  SKIN: Warm and dry NEUROLOGIC:  Alert and oriented x 3 PSYCHIATRIC:  Normal affect    Signed, Jenean Lindau, MD  04/13/2019 2:21 PM    [facility]

## 2019-04-13 NOTE — Patient Instructions (Signed)
Medication Instructions:  Your physician recommends that you continue on your current medications as directed. Please refer to the Current Medication list given to you today.  *If you need a refill on your cardiac medications before your next appointment, please call your pharmacy*  Lab Work: NONE If you have labs (blood work) drawn today and your tests are completely normal, you will receive your results only by: Marland Kitchen MyChart Message (if you have MyChart) OR . A paper copy in the mail If you have any lab test that is abnormal or we need to change your treatment, we will call you to review the results.  Testing/Procedures: Your physician has recommended that you wear a ZIO monitor.ZIO monitors are medical devices that record the heart's electrical activity. Doctors most often use these monitors to diagnose arrhythmias. Arrhythmias are problems with the speed or rhythm of the heartbeat. The monitor is a small, portable device. You can wear one while you do your normal daily activities. This is usually used to diagnose what is causing palpitations/syncope (passing out).You will wear this device for 3 days  Follow-Up: At [facility], you and your health needs are our priority.  As part of our continuing mission to provide you with exceptional heart care, we have created designated Provider Care Teams.  These Care Teams include your primary Cardiologist (physician) and Advanced Practice Providers (APPs -  Physician Assistants and Nurse Practitioners) who all work together to provide you with the care you need, when you need it.  Your next appointment:   6 months  The format for your next appointment:   In Person  Provider:   Jyl Heinz, MD  Other Instructions  Echocardiogram An echocardiogram is a procedure that uses painless sound waves (ultrasound) to produce an image of the heart. Images from an echocardiogram can provide important information about:  Signs of coronary artery  disease (CAD).  Aneurysm detection. An aneurysm is a weak or damaged part of an artery wall that bulges out from the normal force of blood pumping through the body.  Heart size and shape. Changes in the size or shape of the heart can be associated with certain conditions, including heart failure, aneurysm, and CAD.  Heart muscle function.  Heart valve function.  Signs of a past heart attack.  Fluid buildup around the heart.  Thickening of the heart muscle.  A tumor or infectious growth around the heart valves. Tell a health care provider about:  Any allergies you have.  All medicines you are taking, including vitamins, herbs, eye drops, creams, and over-the-counter medicines.  Any blood disorders you have.  Any surgeries you have had.  Any medical conditions you have.  Whether you are pregnant or may be pregnant. What are the risks? Generally, this is a safe procedure. However, problems may occur, including:  Allergic reaction to dye (contrast) that may be used during the procedure. What happens before the procedure? No specific preparation is needed. You may eat and drink normally. What happens during the procedure?   An IV tube may be inserted into one of your veins.  You may receive contrast through this tube. A contrast is an injection that improves the quality of the pictures from your heart.  A gel will be applied to your chest.  A wand-like tool (transducer) will be moved over your chest. The gel will help to transmit the sound waves from the transducer.  The sound waves will harmlessly bounce off of your heart to allow the heart  images to be captured in real-time motion. The images will be recorded on a computer. The procedure may vary among health care providers and hospitals. What happens after the procedure?  You may return to your normal, everyday life, including diet, activities, and medicines, unless your health care provider tells you not to do that.  Summary  An echocardiogram is a procedure that uses painless sound waves (ultrasound) to produce an image of the heart.  Images from an echocardiogram can provide important information about the size and shape of your heart, heart muscle function, heart valve function, and fluid buildup around your heart.  You do not need to do anything to prepare before this procedure. You may eat and drink normally.  After the echocardiogram is completed, you may return to your normal, everyday life, unless your health care provider tells you not to do that. This information is not intended to replace advice given to you by your health care provider. Make sure you discuss any questions you have with your health care provider. Document Released: 06/01/2000 Document Revised: 09/25/2018 Document Reviewed: 07/07/2016 Elsevier Patient Education  2020 [employer].

## 2019-04-17 ENCOUNTER — Ambulatory Visit (HOSPITAL_BASED_OUTPATIENT_CLINIC_OR_DEPARTMENT_OTHER)
Admission: RE | Admit: 2019-04-17 | Discharge: 2019-04-17 | Disposition: A | Discharge status: Home / Self Care | Payer: 59 | Source: Ambulatory Visit | Attending: Cardiology | Admitting: Cardiology

## 2019-04-17 ENCOUNTER — Other Ambulatory Visit: Payer: Self-pay

## 2019-04-17 DIAGNOSIS — R002 Palpitations: Secondary | ICD-10-CM | POA: Diagnosis not present

## 2019-04-17 NOTE — Progress Notes (Signed)
  Echocardiogram 2D Echocardiogram has been performed.  Cheryl [name] 04/17/2019, 3:21 PM

## 2019-04-20 ENCOUNTER — Telehealth: Payer: Self-pay

## 2019-04-20 NOTE — Telephone Encounter (Signed)
Left message for patient to call back for results, copy sent to Dr. Birdie Riddle.

## 2019-04-20 NOTE — Telephone Encounter (Signed)
-----   Message from Jenean Lindau, MD sent at 04/20/2019 11:54 AM EST ----- The results of the study is unremarkable. Please inform patient. I will discuss in detail at next appointment. Cc  primary care/referring physician Jenean Lindau, MD 04/20/2019 11:54 AM

## 2019-05-20 ENCOUNTER — Telehealth: Payer: Self-pay

## 2019-05-20 NOTE — Telephone Encounter (Signed)
-----   Message from Jenean Lindau, MD sent at 05/20/2019 10:38 AM EST ----- The results of the study is unremarkable. Please inform patient. I will discuss in detail at next appointment. Cc  primary care/referring physician Jenean Lindau, MD 05/20/2019 10:37 AM

## 2019-05-20 NOTE — Telephone Encounter (Signed)
Results relayed, copy sent to Dr. Tabori 

## 2019-05-22 MED FILL — NUVARING VAGINAL RING: 0.12-0.015 | 28 days supply | Qty: 1 | Fill #0

## 2019-05-22 MED FILL — FLUoxetine HCL 40 MG CAPS: 40 | 30 days supply | Qty: 30 | Fill #0

## 2019-05-30 ENCOUNTER — Ambulatory Visit (HOSPITAL_COMMUNITY)
Admission: EM | Admit: 2019-05-30 | Discharge: 2019-05-30 | Disposition: A | Discharge status: Home / Self Care | Payer: 59 | Attending: Emergency Medicine | Admitting: Emergency Medicine

## 2019-05-30 ENCOUNTER — Other Ambulatory Visit: Payer: Self-pay

## 2019-05-30 ENCOUNTER — Encounter (HOSPITAL_COMMUNITY): Payer: Self-pay

## 2019-05-30 DIAGNOSIS — Z20828 Contact with and (suspected) exposure to other viral communicable diseases: Secondary | ICD-10-CM

## 2019-05-30 DIAGNOSIS — R0602 Shortness of breath: Secondary | ICD-10-CM

## 2019-05-30 DIAGNOSIS — J069 Acute upper respiratory infection, unspecified: Secondary | ICD-10-CM | POA: Insufficient documentation

## 2019-05-30 DIAGNOSIS — R05 Cough: Secondary | ICD-10-CM | POA: Diagnosis not present

## 2019-05-30 DIAGNOSIS — Z20822 Contact with and (suspected) exposure to covid-19: Secondary | ICD-10-CM

## 2019-05-30 MED ORDER — PSEUDOEPH-BROMPHEN-DM 30-2-10 MG/5ML PO SYRP: 5.0000 mL | ORAL_SOLUTION | Freq: Four times a day (QID) | ORAL | 0 refills | Status: DC | PRN

## 2019-05-30 MED ORDER — FLUTICASONE PROPIONATE 50 MCG/ACT NA SUSP: 1.0000 | Freq: Every day | NASAL | 0 refills | Status: DC

## 2019-05-30 MED FILL — BROMPHENIR-PSEUDOEPHED-DM S: 30-2-10 | 6 days supply | Qty: 120 | Fill #0

## 2019-05-30 MED FILL — FLUTICASONE PROP 50 MCG SPR: 50 | 30 days supply | Qty: 16 | Fill #0

## 2019-05-30 NOTE — ED Triage Notes (Signed)
Pt presents for covid testing after an exposure 6 days ago; pt states she has a loss of appetite, fatigue, and shortness of breath.

## 2019-05-30 NOTE — Discharge Instructions (Signed)
COVID swab pending, monitor my chart for results Please rest and drink plenty of fluids May use cough syrup as needed every 6 hours for cough and congestion Flonase nasal spray 1 to 2 spray in each nostril daily If still feeling congested may add in daily cetirizine/Zyrtec Please quarantine and stay at home and limit exposure to others for the next 10 days Please follow-up if symptoms not resolving or worsening

## 2019-05-30 NOTE — ED Provider Notes (Signed)
[place]    CSN: [id number] Arrival date & time: 05/30/19  1134      History   Chief Complaint Chief Complaint  Patient presents with  . Appointment  . (11:30 Covid Exposure)    HPI Cheryl [name] is a 18 y.o. female history of PCOS presenting today for evaluation of possible Covid.  Patient states that she recently had a Covid exposure approximately 6 days ago.  She was around this person for approximately 1 hour without masks.  She states that 3 to 4 days ago she has developed fatigue, fogginess, decreased appetite as well as headaches and mild URI symptoms of congestion cough and mild throat irritation.  She denies any known or measured fevers, but has felt slightly warm.  Denies chest pain.  Has had some mild shortness of breath.  HPI  Past Medical History:  Diagnosis Date  . Allergy   . Anxiety   . Depression   . PCOS (polycystic ovarian syndrome) 11/2016    Patient Active Problem List   Diagnosis Date Noted  . Palpitations 04/13/2019  . PCOS (polycystic ovarian syndrome) 11/16/2016  . Physical exam 11/15/2016  . Amenorrhea 11/15/2016  . Weight gain 11/15/2016  . GAD (generalized anxiety disorder) 03/22/2015  . MDD (major depressive disorder), recurrent episode, moderate ([place]) 03/17/2015    Past Surgical History:  Procedure Laterality Date  . WISDOM TOOTH EXTRACTION    . WISDOM TOOTH EXTRACTION      OB History   No obstetric history on file.      Home Medications    Prior to Admission medications   Medication Sig Start Date End Date Taking? Authorizing Provider  brompheniramine-pseudoephedrine-DM 30-2-10 MG/5ML syrup Take 5 mLs by mouth 4 (four) times daily as needed. 05/30/19   Jessika Rothery C, PA-C  CONCERTA 27 MG CR tablet  10/21/18   [provider]  EPINEPHrine 0.3 mg/0.3 mL IJ SOAJ injection  11/30/15   [provider]  fexofenadine-pseudoephedrine (ALLEGRA-D ALLERGY & CONGESTION) 180-240 MG 24 hr tablet Take 1  tablet by mouth daily. 02/28/18   Brunetta Jeans, PA-C  FLUoxetine (PROZAC) 40 MG capsule Take 1 capsule (40 mg total) by mouth daily. 11/19/17 11/19/18  Aundra Dubin, MD  fluticasone (FLONASE) 50 MCG/ACT nasal spray Place 1-2 sprays into both nostrils daily for 7 days. 05/30/19 06/06/19  Reyah Streeter, Elesa Hacker, PA-C  NUVARING 0.12-0.015 MG/24HR vaginal ring  09/02/18   [provider]    Family History Family History  Problem Relation Age of Onset  . Depression Mother   . Anxiety disorder Mother   . Hyperlipidemia Mother   . Hypertension Mother   . Hypertension Father   . Depression Maternal Uncle   . Hypertension Maternal Grandmother   . Hypertension Maternal Grandfather   . Drug abuse Maternal Grandfather     Social History Social History   Tobacco Use  . Smoking status: Former Smoker    Quit date: 10/21/2017    Years since quitting: 1.6  . Smokeless tobacco: Never Used  Substance Use Topics  . Alcohol use: No    Alcohol/week: 0.0 standard drinks  . Drug use: No     Allergies   Patient has no known allergies.   Review of Systems Review of Systems  Constitutional: Positive for appetite change and fatigue. Negative for activity change, chills and fever.  HENT: Positive for congestion, rhinorrhea and sore throat. Negative for ear pain, sinus pressure and trouble swallowing.   Eyes: Negative  for discharge and redness.  Respiratory: Positive for cough and shortness of breath. Negative for chest tightness.   Cardiovascular: Negative for chest pain.  Gastrointestinal: Negative for abdominal pain, diarrhea, nausea and vomiting.  Musculoskeletal: Negative for myalgias.  Skin: Negative for rash.  Neurological: Positive for headaches. Negative for dizziness and light-headedness.     Physical Exam Triage Vital Signs ED Triage Vitals [05/30/19 1219]  Enc Vitals Group     BP 117/83     Pulse Rate 75     Resp 17     Temp 98.5 F (36.9 C)     Temp Source Oral       SpO2 99 %     Weight      Height      Head Circumference      Peak Flow      Pain Score 0     Pain Loc      Pain Edu?      Excl. in [place]?    No data found.  Updated Vital Signs BP 117/83 (BP Location: Left Arm)   Pulse 75   Temp 98.5 F (36.9 C) (Oral)   Resp 17   LMP 05/04/2019   SpO2 99%   Visual Acuity Right Eye Distance:   Left Eye Distance:   Bilateral Distance:    Right Eye Near:   Left Eye Near:    Bilateral Near:     Physical Exam Vitals and nursing note reviewed.  Constitutional:      General: She is not in acute distress.    Appearance: She is well-developed.  HENT:     Head: Normocephalic and atraumatic.     Ears:     Comments: Bilateral ears without tenderness to palpation of external auricle, tragus and mastoid, EAC's without erythema or swelling, TM's with good bony landmarks and cone of light. Non erythematous.    Mouth/Throat:     Comments: Oral mucosa pink and moist, no tonsillar enlargement or exudate. Posterior pharynx patent and nonerythematous, no uvula deviation or swelling. Normal phonation. Eyes:     Conjunctiva/sclera: Conjunctivae normal.  Cardiovascular:     Rate and Rhythm: Normal rate and regular rhythm.     Heart sounds: No murmur.  Pulmonary:     Effort: Pulmonary effort is normal. No respiratory distress.     Breath sounds: Normal breath sounds.     Comments: Breathing comfortably at rest, CTABL, no wheezing, rales or other adventitious sounds auscultated Abdominal:     Palpations: Abdomen is soft.  Musculoskeletal:     Cervical back: Neck supple.  Skin:    General: Skin is warm and dry.  Neurological:     Mental Status: She is alert.      UC Treatments / Results  Labs (all labs ordered are listed, but only abnormal results are displayed) Labs Reviewed  NOVEL CORONAVIRUS, NAA (HOSP ORDER, SEND-OUT TO REF LAB; TAT 18-24 HRS)    EKG   Radiology No results found.  Procedures Procedures (including critical care  time)  Medications Ordered in UC Medications - No data to display  Initial Impression / Assessment and Plan / UC Course  I have reviewed the triage vital signs and the nursing notes.  Pertinent labs & imaging results that were available during my care of the patient were reviewed by me and considered in my medical decision making (see chart for details).     Vital signs stable, lungs clear to auscultation.  Recent Covid exposure.  Covid  swab pending.  Will treat as such in the meantime and recommend symptomatic and supportive care as likely viral etiology.  Provided cough syrup and Flonase to help with cough and congestion, rest, fluids.  Continue to monitor breathing,Discussed strict return precautions. Patient verbalized understanding and is agreeable with plan.  Final Clinical Impressions(s) / UC Diagnoses   Final diagnoses:  Viral URI with cough  Exposure to COVID-19 virus     Discharge Instructions     COVID swab pending, monitor my chart for results Please rest and drink plenty of fluids May use cough syrup as needed every 6 hours for cough and congestion Flonase nasal spray 1 to 2 spray in each nostril daily If still feeling congested may add in daily cetirizine/Zyrtec Please quarantine and stay at home and limit exposure to others for the next 10 days Please follow-up if symptoms not resolving or worsening   ED Prescriptions    Medication Sig Dispense Auth. Provider   brompheniramine-pseudoephedrine-DM 30-2-10 MG/5ML syrup Take 5 mLs by mouth 4 (four) times daily as needed. 120 mL Jovan Schickling C, PA-C   fluticasone (FLONASE) 50 MCG/ACT nasal spray Place 1-2 sprays into both nostrils daily for 7 days. 1 g Billye Nydam, [place] C, PA-C     PDMP not reviewed this encounter.   Jaliel Deavers, [place] C, PA-C 05/30/19 1301

## 2019-05-31 LAB — NOVEL CORONAVIRUS, NAA (HOSP ORDER, SEND-OUT TO REF LAB; TAT 18-24 HRS): SARS-CoV-2, NAA: NOT DETECTED

## 2019-06-01 MED FILL — CONCERTA 18 MG TABLET ER: 18 | 30 days supply | Qty: 30 | Fill #0

## 2019-07-14 ENCOUNTER — Ambulatory Visit (INDEPENDENT_AMBULATORY_CARE_PROVIDER_SITE_OTHER): Payer: 59 | Admitting: Family Medicine

## 2019-07-14 ENCOUNTER — Other Ambulatory Visit: Payer: Self-pay

## 2019-07-14 ENCOUNTER — Encounter: Payer: Self-pay | Admitting: Family Medicine

## 2019-07-14 DIAGNOSIS — M533 Sacrococcygeal disorders, not elsewhere classified: Secondary | ICD-10-CM | POA: Diagnosis not present

## 2019-07-14 DIAGNOSIS — M999 Biomechanical lesion, unspecified: Secondary | ICD-10-CM

## 2019-07-14 DIAGNOSIS — S86891A Other injury of other muscle(s) and tendon(s) at lower leg level, right leg, initial encounter: Secondary | ICD-10-CM

## 2019-07-14 MED ORDER — VITAMIN D (ERGOCALCIFEROL) 1.25 MG (50000 UNIT) PO CAPS: 50000.0000 [IU] | ORAL_CAPSULE | ORAL | 0 refills | Status: DC

## 2019-07-14 MED FILL — VIT D2 1.25 MG (50,000 UNIT: 1.25 MG | 84 days supply | Qty: 12 | Fill #0

## 2019-07-14 NOTE — Assessment & Plan Note (Signed)
Decision today to treat with OMT was based on Physical Exam  After verbal consent patient was treated with HVLA, ME, FPR techniques in , thoracic, lumbar and sacral areas  Patient tolerated the procedure well with improvement in symptoms  Patient given exercises, stretches and lifestyle modifications  See medications in patient instructions if given  Patient will follow up in 4-8 weeks 

## 2019-07-14 NOTE — Assessment & Plan Note (Signed)
Discuss orthotics, vitamin D, compression, avoiding high impact, follow-up again in 4 to 6 weeks

## 2019-07-14 NOTE — Assessment & Plan Note (Signed)
Sacroiliac dysfunction.  Discussed with patient, core strengthening.  Patient different exercises could be more beneficial.  Patient has been doing some high impact exercises that I think is more difficult.  Patient is to work with range of motion.  Follow-up again in 4 to8 weeks.  Responded well to manipulation.

## 2019-07-14 NOTE — Progress Notes (Signed)
[facility] [address] [place] Phone: [phone] Subjective:   I [name] am serving as a Education administrator for Dr. Hulan Saas.  This visit occurred during the SARS-CoV-2 public health emergency.  Safety protocols were in place, including screening questions prior to the visit, additional usage of staff PPE, and extensive cleaning of exam room while observing appropriate contact time as indicated for disinfecting solutions.   I'm seeing this patient by the request  of:  Midge Minium, MD  CC: Low back pain  [id number]  TYNEISHA FICO is a 19 y.o. female coming in with complaint of back pain. States the pain started when she dislocated her knee in 2013. Lower leg pain as well. Medial above the ankle. History of stress fractures in her tibia but does not remember which leg.   Onset- Chronic  Location - upper and lower back  Duration-  Character- tight and stiff  Aggravating factors- lower leg movement, running and jumping causes pain in the lower leg  Reliving factors-  Therapies tried- heat, ice on the lower leg as well as rest and elevation  Severity-  8/10 at its worse     Past Medical History:  Diagnosis Date  . Allergy   . Anxiety   . Depression   . PCOS (polycystic ovarian syndrome) 11/2016   Past Surgical History:  Procedure Laterality Date  . WISDOM TOOTH EXTRACTION    . WISDOM TOOTH EXTRACTION     Social History   Socioeconomic History  . Marital status: Single    Spouse name: n/a  . Number of children: 0  . Years of education: Not on file  . Highest education level: High school graduate  Occupational History  . 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 Smoking status: Former Smoker    Quit date: 10/21/2017    Years since quitting: 1.7  . Smokeless tobacco: Never Used  Substance and Sexual Activity  . Alcohol use: No   Alcohol/week: 0.0 standard drinks  . Drug use: No  . Sexual activity: Never  Other Topics Concern  . Not on file  Social History Narrative   Lives her mom mostly. Parents are divorced.   Social Determinants of Health   Financial Resource Strain:   . Difficulty of Paying Living Expenses: Not on file  Food Insecurity:   . Worried About Charity fundraiser in the Last Year: Not on file  . Ran Out of Food in the Last Year: Not on file  Transportation Needs:   . Lack of Transportation (Medical): Not on file  . Lack of Transportation (Non-Medical): Not on file  Physical Activity:   . Days of Exercise per Week: Not on file  . Minutes of Exercise per Session: Not on file  Stress:   . Feeling of Stress : Not on file  Social Connections:   . Frequency of Communication with Friends and Family: Not on file  . Frequency of Social Gatherings with Friends and Family: Not on file  . Attends Religious Services: Not on file  . Active Member of Clubs or Organizations: Not on file  . Attends Archivist Meetings: Not on file  . Marital Status: Not on file   No Known Allergies Family History  Problem Relation Age of Onset  . Depression Mother   . Anxiety disorder Mother   .  Hyperlipidemia Mother   . Hypertension Mother   . Hypertension Father   . Depression Maternal Uncle   . Hypertension Maternal Grandmother   . Hypertension Maternal Grandfather   . Drug abuse Maternal Grandfather     Current Outpatient Medications (Endocrine & Metabolic):  Marland Kitchen  NUVARING 0.12-0.015 MG/24HR vaginal ring,   Current Outpatient Medications (Cardiovascular):  Marland Kitchen  EPINEPHrine 0.3 mg/0.3 mL IJ SOAJ injection,   Current Outpatient Medications (Respiratory):  .  brompheniramine-pseudoephedrine-DM 30-2-10 MG/5ML syrup, Take 5 mLs by mouth 4 (four) times daily as needed. .  fexofenadine-pseudoephedrine (ALLEGRA-D ALLERGY & CONGESTION) 180-240 MG 24 hr tablet, Take 1 tablet by mouth daily. .  fluticasone  (FLONASE) 50 MCG/ACT nasal spray, Place 1-2 sprays into both nostrils daily for 7 days.    Current Outpatient Medications (Other):  Marland Kitchen  CONCERTA 27 MG CR tablet,  .  FLUoxetine (PROZAC) 40 MG capsule, Take 1 capsule (40 mg total) by mouth daily.   Reviewed prior external information including notes and imaging from  primary care provider As well as notes that were available from care everywhere and other healthcare systems.  Past medical history, social, surgical and family history all reviewed in electronic medical record.  No pertanent information unless stated regarding to the chief complaint.   Review of Systems:  No headache, visual changes, nausea, vomiting, diarrhea, constipation, dizziness, abdominal pain, skin rash, fevers, chills, night sweats, weight loss, swollen lymph nodes, body aches, joint swelling, chest pain, shortness of breath, mood changes. POSITIVE muscle aches  Objective  There were no vitals taken for this visit.   General: No apparent distress alert and oriented x3 mood and affect normal, dressed appropriately.  HEENT: Pupils equal, extraocular movements intact  Respiratory: Patient's speak in full sentences and does not appear short of breath  Cardiovascular: No lower extremity edema, non tender, no erythema  Skin: Warm dry intact with no signs of infection or rash on extremities or on axial skeleton.  Abdomen: Soft nontender  Neuro: Cranial nerves II through XII are intact, neurovascularly intact in all extremities with 2+ DTRs and 2+ pulses.  Lymph: No lymphadenopathy of posterior or anterior cervical chain or axillae bilaterally.  Gait normal with good balance and coordination.  MSK:  Non tender with full range of motion and good stability and symmetric strength and tone of shoulders, elbows, wrist, hip, knee and ankles bilaterally.  Low back exam does have some mild loss of lordosis.  Patient does have some mild tenderness to palpation of paraspinal  musculature right greater than left.  Patient has a negative straight leg test.  Patient does have mild limitation in extension of 5 degrees.  Neurovascularly intact distally.  Right shin does have a an area of severe tenderness noted on the medial spine of the tibia distally in the proximal distal one third of the right ankle.  Osteopathic findings T4 extended rotated and side bent left L3 flexed rotated and side bent right Sacrum left on left    Impression and Recommendations:     This case required medical decision making of moderate complexity. The above documentation has been reviewed and is accurate and complete [name], DO       Note: This dictation was prepared with Dragon dictation along with smaller phrase technology. Any transcriptional errors that result from this process are unintentional.

## 2019-07-14 NOTE — Patient Instructions (Signed)
Calf compression with working out [employer] orthotics "total support" Monitor at eye level Once weekly vitamin D See me again in 4-6 weeks

## 2019-08-21 ENCOUNTER — Ambulatory Visit: Payer: 59 | Admitting: Family Medicine

## 2019-08-21 MED FILL — FLUoxetine HCL 40 MG CAPS: 40 | 90 days supply | Qty: 90 | Fill #0

## 2019-08-21 MED FILL — ETONOGESTREL-ETHINYL ESTRAD: 0.12-0.015 | 28 days supply | Qty: 1 | Fill #1

## 2019-08-21 NOTE — Progress Notes (Deleted)
[facility] [address] [place] Phone: [phone] Subjective:    I'm seeing this patient by the request  of:  Birdie Riddle Aundra Millet, MD  CC:   [id number]  Cheryl [name] is a 19 y.o. female coming in with complaint of ***  Onset-  Location Duration-  Character- Aggravating factors- Reliving factors-  Therapies tried-  Severity-     Past Medical History:  Diagnosis Date  . Allergy   . Anxiety   . Depression   . PCOS (polycystic ovarian syndrome) 11/2016   Past Surgical History:  Procedure Laterality Date  . WISDOM TOOTH EXTRACTION    . WISDOM TOOTH EXTRACTION     Social History   Socioeconomic History  . Marital status: Single    Spouse name: n/a  . Number of children: 0  . Years of education: Not on file  . Highest education level: High school graduate  Occupational History  . 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 Smoking status: Former Smoker    Quit date: 10/21/2017    Years since quitting: 1.8  . Smokeless tobacco: Never Used  Substance and Sexual Activity  . Alcohol use: No    Alcohol/week: 0.0 standard drinks  . Drug use: No  . Sexual activity: Never  Other Topics Concern  . Not on file  Social History Narrative   Lives her mom mostly. Parents are divorced.   Social Determinants of Health   Financial Resource Strain:   . Difficulty of Paying Living Expenses: Not on file  Food Insecurity:   . Worried About Charity fundraiser in the Last Year: Not on file  . Ran Out of Food in the Last Year: Not on file  Transportation Needs:   . Lack of Transportation (Medical): Not on file  . Lack of Transportation (Non-Medical): Not on file  Physical Activity:   . Days of Exercise per Week: Not on file  . Minutes of Exercise per Session: Not on file  Stress:   . Feeling of Stress : Not on file  Social Connections:   . Frequency  of Communication with Friends and Family: Not on file  . Frequency of Social Gatherings with Friends and Family: Not on file  . Attends Religious Services: Not on file  . Active Member of Clubs or Organizations: Not on file  . Attends Archivist Meetings: Not on file  . Marital Status: Not on file   No Known Allergies Family History  Problem Relation Age of Onset  . Depression Mother   . Anxiety disorder Mother   . Hyperlipidemia Mother   . Hypertension Mother   . Hypertension Father   . Depression Maternal Uncle   . Hypertension Maternal Grandmother   . Hypertension Maternal Grandfather   . Drug abuse Maternal Grandfather     Current Outpatient Medications (Endocrine & Metabolic):  Marland Kitchen  NUVARING 0.12-0.015 MG/24HR vaginal ring,   Current Outpatient Medications (Cardiovascular):  Marland Kitchen  EPINEPHrine 0.3 mg/0.3 mL IJ SOAJ injection,   Current Outpatient Medications (Respiratory):  .  brompheniramine-pseudoephedrine-DM 30-2-10 MG/5ML syrup, Take 5 mLs by mouth 4 (four) times daily as needed. .  fexofenadine-pseudoephedrine (ALLEGRA-D ALLERGY & CONGESTION) 180-240 MG 24 hr tablet, Take 1 tablet by mouth daily. .  fluticasone (FLONASE) 50 MCG/ACT nasal spray, Place 1-2 sprays into both nostrils daily for 7 days.  Current Outpatient Medications (Other):  Marland Kitchen  CONCERTA 27 MG CR tablet,  .  FLUoxetine (PROZAC) 40 MG capsule, Take 1 capsule (40 mg total) by mouth daily. .  Vitamin D, Ergocalciferol, (DRISDOL) 1.25 MG (50000 UNIT) CAPS capsule, Take 1 capsule (50,000 Units total) by mouth every 7 (seven) days.   Reviewed prior external information including notes and imaging from  primary care provider As well as notes that were available from care everywhere and other healthcare systems.  Past medical history, social, surgical and family history all reviewed in electronic medical record.  No pertanent information unless stated regarding to the chief complaint.   Review of  Systems:  No headache, visual changes, nausea, vomiting, diarrhea, constipation, dizziness, abdominal pain, skin rash, fevers, chills, night sweats, weight loss, swollen lymph nodes, body aches, joint swelling, chest pain, shortness of breath, mood changes. POSITIVE muscle aches  Objective  There were no vitals taken for this visit.   General: No apparent distress alert and oriented x3 mood and affect normal, dressed appropriately.  HEENT: Pupils equal, extraocular movements intact  Respiratory: Patient's speak in full sentences and does not appear short of breath  Cardiovascular: No lower extremity edema, non tender, no erythema  Skin: Warm dry intact with no signs of infection or rash on extremities or on axial skeleton.  Abdomen: Soft nontender  Neuro: Cranial nerves II through XII are intact, neurovascularly intact in all extremities with 2+ DTRs and 2+ pulses.  Lymph: No lymphadenopathy of posterior or anterior cervical chain or axillae bilaterally.  Gait normal with good balance and coordination.  MSK:  Non tender with full range of motion and good stability and symmetric strength and tone of shoulders, elbows, wrist, hip, knee and ankles bilaterally.     Impression and Recommendations:     This case required medical decision making of moderate complexity. The above documentation has been reviewed and is accurate and complete [name], DO       Note: This dictation was prepared with Dragon dictation along with smaller phrase technology. Any transcriptional errors that result from this process are unintentional.

## 2019-08-22 MED FILL — CONCERTA 18 MG TABLET ER: 18 | 30 days supply | Qty: 30 | Fill #0

## 2019-08-24 DIAGNOSIS — F902 Attention-deficit hyperactivity disorder, combined type: Secondary | ICD-10-CM | POA: Diagnosis not present

## 2019-08-24 DIAGNOSIS — F332 Major depressive disorder, recurrent severe without psychotic features: Secondary | ICD-10-CM | POA: Diagnosis not present

## 2019-08-24 DIAGNOSIS — F411 Generalized anxiety disorder: Secondary | ICD-10-CM | POA: Diagnosis not present

## 2019-09-07 ENCOUNTER — Encounter: Payer: Self-pay | Admitting: Family Medicine

## 2019-09-07 ENCOUNTER — Ambulatory Visit (INDEPENDENT_AMBULATORY_CARE_PROVIDER_SITE_OTHER): Payer: 59 | Admitting: Family Medicine

## 2019-09-07 ENCOUNTER — Other Ambulatory Visit: Payer: Self-pay

## 2019-09-07 VITALS — BP 130/80 | HR 84 | Ht 66.0 in | Wt 177.0 lb

## 2019-09-07 DIAGNOSIS — M533 Sacrococcygeal disorders, not elsewhere classified: Secondary | ICD-10-CM | POA: Diagnosis not present

## 2019-09-07 DIAGNOSIS — M999 Biomechanical lesion, unspecified: Secondary | ICD-10-CM

## 2019-09-07 NOTE — Assessment & Plan Note (Signed)
Chronic problem :   interventions previously, including medication management: Vitamin D once weekly and will continue.   Interventions this visit: Osteopenia patient We discussed with patient the importance ergonomics, home exercises, icing regimen, and over-the-counter natural products.   Future considerations but will be based on evaluation and next visit: Patient is doing well so likely not mood changes.  If worsening symptoms will consider the possibility of formal physical therapy    Return to clinic: 7 to 8 weeks

## 2019-09-07 NOTE — Progress Notes (Signed)
[facility] [address] [place] Phone: [phone] Subjective:   I [name] am serving as a Education administrator for Dr. Hulan Saas.  This visit occurred during the SARS-CoV-2 public health emergency.  Safety protocols were in place, including screening questions prior to the visit, additional usage of staff PPE, and extensive cleaning of exam room while observing appropriate contact time as indicated for disinfecting solutions.   I'm seeing this patient by the request  of:  Midge Minium, MD  CC: Low back pain follow-up  [id number]   07/14/2019 Sacroiliac dysfunction.  Discussed with patient, core strengthening.  Patient different exercises could be more beneficial.  Patient has been doing some high impact exercises that I think is more difficult.  Patient is to work with range of motion.  Follow-up again in 4 to8 weeks.  Responded well to manipulation.  Update 09/07/2019 Cheryl [name] is a 19 y.o. female coming in with complaint of SI joint pain. Patient states she is doing well.  Patient has been doing the exercises occasionally.  Feels like when she does the exercises does seem to do better.  Patient denies any radiation down either legs.  Has noticed them since the manipulation has been doing better but now slowly returning again.    Past Medical History:  Diagnosis Date  . Allergy   . Anxiety   . Depression   . PCOS (polycystic ovarian syndrome) 11/2016   Past Surgical History:  Procedure Laterality Date  . WISDOM TOOTH EXTRACTION    . WISDOM TOOTH EXTRACTION     Social History   Socioeconomic History  . Marital status: Single    Spouse name: n/a  . Number of children: 0  . Years of education: Not on file  . Highest education level: High school graduate  Occupational History  . 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 Smoking status: Former  Smoker    Quit date: 10/21/2017    Years since quitting: 1.8  . Smokeless tobacco: Never Used  Substance and Sexual Activity  . Alcohol use: No    Alcohol/week: 0.0 standard drinks  . Drug use: No  . Sexual activity: Never  Other Topics Concern  . Not on file  Social History Narrative   Lives her mom mostly. Parents are divorced.   Social Determinants of Health   Financial Resource Strain:   . Difficulty of Paying Living Expenses:   Food Insecurity:   . Worried About Charity fundraiser in the Last Year:   . Arboriculturist in the Last Year:   Transportation Needs:   . Film/video editor (Medical):   Marland Kitchen Lack of Transportation (Non-Medical):   Physical Activity:   . Days of Exercise per Week:   . Minutes of Exercise per Session:   Stress:   . Feeling of Stress :   Social Connections:   . Frequency of Communication with Friends and Family:   . Frequency of Social Gatherings with Friends and Family:   . Attends Religious Services:   . Active Member of Clubs or Organizations:   . Attends Archivist Meetings:   Marland Kitchen Marital Status:    No Known Allergies Family History  Problem Relation Age of Onset  . Depression Mother   . Anxiety disorder Mother   . Hyperlipidemia Mother   . Hypertension Mother   .  Hypertension Father   . Depression Maternal Uncle   . Hypertension Maternal Grandmother   . Hypertension Maternal Grandfather   . Drug abuse Maternal Grandfather     Current Outpatient Medications (Endocrine & Metabolic):  Marland Kitchen  NUVARING 0.12-0.015 MG/24HR vaginal ring,   Current Outpatient Medications (Cardiovascular):  Marland Kitchen  EPINEPHrine 0.3 mg/0.3 mL IJ SOAJ injection,   Current Outpatient Medications (Respiratory):  .  brompheniramine-pseudoephedrine-DM 30-2-10 MG/5ML syrup, Take 5 mLs by mouth 4 (four) times daily as needed. .  fexofenadine-pseudoephedrine (ALLEGRA-D ALLERGY & CONGESTION) 180-240 MG 24 hr tablet, Take 1 tablet by mouth daily. .  fluticasone  (FLONASE) 50 MCG/ACT nasal spray, Place 1-2 sprays into both nostrils daily for 7 days.    Current Outpatient Medications (Other):  Marland Kitchen  CONCERTA 27 MG CR tablet,  .  Vitamin D, Ergocalciferol, (DRISDOL) 1.25 MG (50000 UNIT) CAPS capsule, Take 1 capsule (50,000 Units total) by mouth every 7 (seven) days. Marland Kitchen  FLUoxetine (PROZAC) 40 MG capsule, Take 1 capsule (40 mg total) by mouth daily.   Reviewed prior external information including notes and imaging from  primary care provider As well as notes that were available from care everywhere and other healthcare systems.  Past medical history, social, surgical and family history all reviewed in electronic medical record.  No pertanent information unless stated regarding to the chief complaint.   Review of Systems:  No headache, visual changes, nausea, vomiting, diarrhea, constipation, dizziness, abdominal pain, skin rash, fevers, chills, night sweats, weight loss, swollen lymph nodes, body aches, joint swelling, chest pain, shortness of breath, mood changes. POSITIVE muscle aches  Objective  Blood pressure 130/80, pulse 84, height 5\' 6"  (1.676 m), weight 177 lb (80.3 kg), SpO2 98 %.   General: No apparent distress alert and oriented x3 mood and affect normal, dressed appropriately.  HEENT: Pupils equal, extraocular movements intact  Respiratory: Patient's speak in full sentences and does not appear short of breath  Cardiovascular: No lower extremity edema, non tender, no erythema  Neuro: Cranial nerves II through XII are intact, neurovascularly intact in all extremities with 2+ DTRs and 2+ pulses.  Gait normal with good balance and coordination.  MSK:  Non tender with full range of motion and good stability and symmetric strength and tone of shoulders, elbows, wrist, hip, knee and ankles bilaterally.  Low back exam shows that patient does have some mild tenderness over the sacroiliac joint bilaterally.  Negative straight leg test.  Tightness with  Corky Sox but no radicular symptoms at all.  Minimal discomfort in the piriformis area.  Near full range of motion of the lumbar spine.  Osteopathic findings T7 extended rotated and side bent left L2 flexed rotated and side bent right L4 flexed rotated and side bent left Sacrum right on right    Impression and Recommendations:     This case required medical decision making of moderate complexity. The above documentation has been reviewed and is accurate and complete [name], DO       Note: This dictation was prepared with Dragon dictation along with smaller phrase technology. Any transcriptional errors that result from this process are unintentional.

## 2019-09-07 NOTE — Assessment & Plan Note (Signed)
   Decision today to treat with OMT was based on Physical Exam  After verbal consent patient was treated with HVLA, ME, FPR techniques in , thoracic,  lumbar and sacral areas, all areas are chronic   Patient tolerated the procedure well with improvement in symptoms  Patient given exercises, stretches and lifestyle modifications  See medications in patient instructions if given  Patient will follow up in 4-8 weeks

## 2019-09-07 NOTE — Patient Instructions (Signed)
Keep doing exercises and vitamins See me in 7-8 weeks

## 2019-09-25 ENCOUNTER — Other Ambulatory Visit: Payer: Self-pay

## 2019-09-25 ENCOUNTER — Encounter: Payer: Self-pay | Admitting: Family Medicine

## 2019-09-25 ENCOUNTER — Telehealth (INDEPENDENT_AMBULATORY_CARE_PROVIDER_SITE_OTHER): Payer: 59 | Admitting: Family Medicine

## 2019-09-25 DIAGNOSIS — B373 Candidiasis of vulva and vagina: Secondary | ICD-10-CM

## 2019-09-25 DIAGNOSIS — J329 Chronic sinusitis, unspecified: Secondary | ICD-10-CM | POA: Diagnosis not present

## 2019-09-25 DIAGNOSIS — B3731 Acute candidiasis of vulva and vagina: Secondary | ICD-10-CM

## 2019-09-25 DIAGNOSIS — B9689 Other specified bacterial agents as the cause of diseases classified elsewhere: Secondary | ICD-10-CM | POA: Diagnosis not present

## 2019-09-25 MED ORDER — ONDANSETRON HCL 4 MG PO TABS: 4.0000 mg | ORAL_TABLET | Freq: Three times a day (TID) | ORAL | 1 refills | Status: DC | PRN

## 2019-09-25 MED ORDER — AMOXICILLIN 875 MG PO TABS: 875.0000 mg | ORAL_TABLET | Freq: Two times a day (BID) | ORAL | 0 refills | Status: DC

## 2019-09-25 MED ORDER — FLUCONAZOLE 150 MG PO TABS: 150.0000 mg | ORAL_TABLET | Freq: Once | ORAL | 0 refills | Status: AC

## 2019-09-25 MED FILL — ONDANSETRON HCL 4 MG TABS: 4 | 6 days supply | Qty: 20 | Fill #0

## 2019-09-25 MED FILL — FLUCONAZOLE 150 MG TABS: 150 | 1 days supply | Qty: 1 | Fill #0

## 2019-09-25 MED FILL — AMOXICILLIN 875 MG TABS: 875 | 10 days supply | Qty: 20 | Fill #0

## 2019-09-25 NOTE — Progress Notes (Signed)
I have discussed the procedure for the virtual visit with the patient who has given consent to proceed with assessment and treatment.   Pt unable to obtain vitals.   Jessica L Brodmerkel, CMA     

## 2019-09-25 NOTE — Progress Notes (Signed)
Virtual Visit via Video   I connected with patient on 09/25/19 at  9:45 AM EDT by a video enabled telemedicine application and verified that I am speaking with the correct person using two identifiers.  Location patient: Home Location provider: Acupuncturist, Office Persons participating in the virtual visit: Patient, Provider, [place] (Jess B)  I discussed the limitations of evaluation and management by telemedicine and the availability of in person appointments. The patient expressed understanding and agreed to proceed.  Subjective:   HPI:   URI- 'i'm having really bad allergies' despite taking medication regularly.  Pt reports allergies have been bad x2 weeks but acutely worsened x4 days.  Currently on Allegra D and Flonase.  + facial pain- frontal and maxillary sinus pain, L>R.  Having some bloody nasal drainage and drainage is consistently green or cloudy.  Feels feverish but temp is normal.  Bilateral ear fullness/pain.  No tooth pain, + sore throat.  Cough started 1-2 days ago- worse at night.  No known sick contacts.  No body aches.  + fatigue.  Fully vaccinated as of 2 weeks ago.  Pt has hx of yeast vaginitis after abx  ROS:   See pertinent positives and negatives per HPI.  Patient Active Problem List   Diagnosis Date Noted  . SI (sacroiliac) joint dysfunction 07/14/2019  . Nonallopathic lesion of sacral region 07/14/2019  . Nonallopathic lesion of lumbosacral region 07/14/2019  . Nonallopathic lesion of thoracic region 07/14/2019  . Medial tibial stress syndrome, right, initial encounter 07/14/2019  . Palpitations 04/13/2019  . PCOS (polycystic ovarian syndrome) 11/16/2016  . Physical exam 11/15/2016  . Amenorrhea 11/15/2016  . Weight gain 11/15/2016  . GAD (generalized anxiety disorder) 03/22/2015  . MDD (major depressive disorder), recurrent episode, moderate ([place]) 03/17/2015    Social History   Tobacco Use  . Smoking status: Former Smoker    Quit date:  10/21/2017    Years since quitting: 1.9  . Smokeless tobacco: Never Used  Substance Use Topics  . Alcohol use: No    Alcohol/week: 0.0 standard drinks    Current Outpatient Medications:  .  brompheniramine-pseudoephedrine-DM 30-2-10 MG/5ML syrup, Take 5 mLs by mouth 4 (four) times daily as needed., Disp: 120 mL, Rfl: 0 .  CONCERTA 18 MG CR tablet, , Disp: , Rfl:  .  EPINEPHrine 0.3 mg/0.3 mL IJ SOAJ injection, , Disp: , Rfl: 1 .  fexofenadine-pseudoephedrine (ALLEGRA-D ALLERGY & CONGESTION) 180-240 MG 24 hr tablet, Take 1 tablet by mouth daily., Disp: 20 tablet, Rfl: 0 .  NUVARING 0.12-0.015 MG/24HR vaginal ring, , Disp: , Rfl:  .  Vitamin D, Ergocalciferol, (DRISDOL) 1.25 MG (50000 UNIT) CAPS capsule, Take 1 capsule (50,000 Units total) by mouth every 7 (seven) days., Disp: 12 capsule, Rfl: 0 .  FLUoxetine (PROZAC) 40 MG capsule, Take 1 capsule (40 mg total) by mouth daily., Disp: 90 capsule, Rfl: 0 .  fluticasone (FLONASE) 50 MCG/ACT nasal spray, Place 1-2 sprays into both nostrils daily for 7 days., Disp: 1 g, Rfl: 0  No Known Allergies  Objective:   There were no vitals taken for this visit.  AAOx3, NAD NCAT, EOMI No obvious CN deficits + frontal and maxillary facial pain Coloring WNL + nasally voice Pt is able to speak clearly, coherently without shortness of breath or increased work of breathing.  Thought process is linear.  Mood is appropriate.   Assessment and Plan:   Bacterial sinusitis- new.  Likely started out as seasonal allergies but become secondarily  infected.  Start Amox.  Continue daily allergy medications.  Reviewed supportive care and red flags that should prompt return.  Pt expressed understanding and is in agreement w/ plan.   Yeast vaginitis- tends to occur after abx use.  Diflucan sent for pt to use prn.   Annye Asa, MD 09/25/2019

## 2019-09-28 ENCOUNTER — Encounter: Payer: Self-pay | Admitting: General Practice

## 2019-09-28 ENCOUNTER — Ambulatory Visit: Payer: 59 | Admitting: Physician Assistant

## 2019-09-28 ENCOUNTER — Telehealth: Payer: Self-pay

## 2019-09-28 ENCOUNTER — Telehealth: Payer: Self-pay | Admitting: Family Medicine

## 2019-09-28 ENCOUNTER — Other Ambulatory Visit: Payer: Self-pay

## 2019-09-28 VITALS — BP 135/86 | HR 97 | Temp 98.1°F | Resp 18 | Ht 66.0 in | Wt 180.0 lb

## 2019-09-28 DIAGNOSIS — J301 Allergic rhinitis due to pollen: Secondary | ICD-10-CM | POA: Diagnosis not present

## 2019-09-28 DIAGNOSIS — J302 Other seasonal allergic rhinitis: Secondary | ICD-10-CM

## 2019-09-28 DIAGNOSIS — J069 Acute upper respiratory infection, unspecified: Secondary | ICD-10-CM

## 2019-09-28 MED ORDER — ALBUTEROL SULFATE (2.5 MG/3ML) 0.083% IN NEBU: 2.5000 mg | INHALATION_SOLUTION | Freq: Four times a day (QID) | RESPIRATORY_TRACT | 1 refills | Status: DC | PRN

## 2019-09-28 MED FILL — ALBUTEROL 0.083 MG/ML SOLN: (2.5 MG/3ML | 14 days supply | Qty: 180 | Fill #0

## 2019-09-28 NOTE — Telephone Encounter (Signed)
Is she taking OTC Mucinex DM to help w/ cough and chest congestion?  Is she having shortness of breath?  If having shortness of breath, we can send Albuterol inhaler- 2 puffs q4-6 prn cough/wheezing.  If no improvement at that time, will need COVID test

## 2019-09-28 NOTE — Telephone Encounter (Signed)
Staff contacted pt to inform them where to retrieve new medication.

## 2019-09-28 NOTE — Patient Instructions (Addendum)
Continue to use your Allegra, Nasacort, and amoxicillin as prescribed, continue using Advil as needed.  I sent a nebulizer to your pharmacy, you can use this every 6 hours to help with your shortness of breath and chest tightness.  Vicks VapoRub can also be helpful to help relieve your symptoms.  Make sure to stay well-hydrated, and get plenty of rest.  Thank you for coming to see Cheryl [name] and trusting Cheryl [name] with your care, I hope that you feel better soon!  [name], PA-C Physician Assistant [facility] [email or web address]    Upper Respiratory Infection, Adult An upper respiratory infection (URI) affects the nose, throat, and upper air passages. URIs are caused by germs (viruses). The most common type of URI is often called "the common cold." Medicines cannot cure URIs, but you can do things at home to relieve your symptoms. URIs usually get better within 7-10 days. Follow these instructions at home: Activity  Rest as needed.  If you have a fever, stay home from work or school until your fever is gone, or until your doctor says you may return to work or school. ? You should stay home until you cannot spread the infection anymore (you are not contagious). ? Your doctor may have you wear a face mask so you have less risk of spreading the infection. Relieving symptoms  Gargle with a salt-water mixture 3-4 times a day or as needed. To make a salt-water mixture, completely dissolve -1 tsp of salt in 1 cup of warm water.  Use a cool-mist humidifier to add moisture to the air. This can help you breathe more easily. Eating and drinking   Drink enough fluid to keep your pee (urine) pale yellow.  Eat soups and other clear broths. General instructions   Take over-the-counter and prescription medicines only as told by your doctor. These include cold medicines, fever reducers, and cough suppressants.  Do not use any products that contain  nicotine or tobacco. These include cigarettes and e-cigarettes. If you need help quitting, ask your doctor.  Avoid being where people are smoking (avoid secondhand smoke).  Make sure you get regular shots and get the flu shot every year.  Keep all follow-up visits as told by your doctor. This is important. How to avoid spreading infection to others   Wash your hands often with soap and water. If you do not have soap and water, use hand sanitizer.  Avoid touching your mouth, face, eyes, or nose.  Cough or sneeze into a tissue or your sleeve or elbow. Do not cough or sneeze into your hand or into the air. Contact a doctor if:  You are getting worse, not better.  You have any of these: ? A fever. ? Chills. ? Brown or red mucus in your nose. ? Yellow or brown fluid (discharge)coming from your nose. ? Pain in your face, especially when you bend forward. ? Swollen neck glands. ? Pain with swallowing. ? White areas in the back of your throat. Get help right away if:  You have shortness of breath that gets worse.  You have very bad or constant: ? Headache. ? Ear pain. ? Pain in your forehead, behind your eyes, and over your cheekbones (sinus pain). ? Chest pain.  You have long-lasting (chronic) lung disease along with any of these: ? Wheezing. ? Long-lasting cough. ? Coughing up blood. ? A change in your usual mucus.  You have a stiff neck.  You have changes in your: ? Vision. ?  Hearing. ? Thinking. ? Mood. Summary  An upper respiratory infection (URI) is caused by a germ called a virus. The most common type of URI is often called "the common cold."  URIs usually get better within 7-10 days.  Take over-the-counter and prescription medicines only as told by your doctor. This information is not intended to replace advice given to you by your health care provider. Make sure you discuss any questions you have with your health care provider. Document Revised: 06/12/2018  Document Reviewed: 01/25/2017 Elsevier Patient Education  [place].

## 2019-09-28 NOTE — Telephone Encounter (Signed)
Pt called in stating that she is having a cough, burning in the chest, and chest tightness. It started Friday but has got worse over the weekend. Pt states that she has been taking the medication that was prescribed to her. She wanted to know what Dr. Birdie Riddle recommended she do about this. Please advise. She had a VV on Friday.

## 2019-09-28 NOTE — Progress Notes (Signed)
New Patient Office Visit  Subjective:  Patient ID: Cheryl [name], female    DOB: 2001/04/11  Age: 19 y.o. MRN: [id number]  CC:  Chief Complaint  Patient presents with  . URI    HPI Cheryl [name] reports that she has been feeling poorly for the past few weeks, states that she became worse approximately 8 days ago.  Reports that she thought she was having really bad allergies despite taking her allergy medication.  Reports that she was seen virtually by her primary care provider who diagnosed her with bacterial sinusitis and started her on amoxicillin.  States that she is in the middle of her fourth day of this regimen.  Reports she continues to take Allegra D, Nasacort, and started Mucinex yesterday.  She is also using Advil, last dose was this morning.  Reports that she has been having a dry cough, states that she has been starting to chest tightness, pain when she coughs, shortness of breath, the feeling of burning in her chest.  Denies history of asthma.  Denies measured fever, but does state that she feels hot.  Reports that she has lost her appetite, but is staying hydrated.  Is fully vaccinated from COVID-19 as of 2-1/2 weeks ago.   Past Medical History:  Diagnosis Date  . Allergy   . Anxiety   . Depression   . PCOS (polycystic ovarian syndrome) 11/2016    Past Surgical History:  Procedure Laterality Date  . WISDOM TOOTH EXTRACTION    . WISDOM TOOTH EXTRACTION      Family History  Problem Relation Age of Onset  . Depression Mother   . Anxiety disorder Mother   . Hyperlipidemia Mother   . Hypertension Mother   . Hypertension Father   . Depression Maternal Uncle   . Hypertension Maternal Grandmother   . Hypertension Maternal Grandfather   . Drug abuse Maternal Grandfather     Social History   Socioeconomic History  . Marital status: Single    Spouse name: n/a  . Number of children: 0  . Years of education: Not on file  . Highest education level: High  school graduate  Occupational History  . Occupation: Lexicographer:  minor    Comment: [employer] Surveyor, mining at [employer]  . Smoking status: Former Smoker    Quit date: 10/21/2017    Years since quitting: 1.9  . Smokeless tobacco: Never Used  Substance and Sexual Activity  . Alcohol use: No    Alcohol/week: 0.0 standard drinks  . Drug use: No  . Sexual activity: Never  Other Topics Concern  . Not on file  Social History Narrative   Lives her mom mostly. Parents are divorced.   Social Determinants of Health   Financial Resource Strain:   . Difficulty of Paying Living Expenses:   Food Insecurity:   . Worried About Charity fundraiser in the Last Year:   . Arboriculturist in the Last Year:   Transportation Needs:   . Film/video editor (Medical):   Marland Kitchen Lack of Transportation (Non-Medical):   Physical Activity:   . Days of Exercise per Week:   . Minutes of Exercise per Session:   Stress:   . Feeling of Stress :   Social Connections:   . Frequency of Communication with Friends and Family:   . Frequency of Social Gatherings with Friends and Family:   . Attends Religious Services:   .  Active Member of Clubs or Organizations:   . Attends Archivist Meetings:   Marland Kitchen Marital Status:   Intimate Partner Violence:   . Fear of Current or Ex-Partner:   . Emotionally Abused:   Marland Kitchen Physically Abused:   . Sexually Abused:     ROS Review of Systems  Constitutional: Positive for fatigue. Negative for chills and fever.  HENT: Positive for congestion, postnasal drip and sinus pressure. Negative for ear pain and sore throat.   Eyes: Negative.   Respiratory: Positive for cough, chest tightness and shortness of breath. Negative for wheezing.   Cardiovascular: Negative.   Gastrointestinal: Negative.   Endocrine: Negative.   Genitourinary: Negative.   Musculoskeletal: Negative.   Skin: Negative.   Allergic/Immunologic: Positive for environmental  allergies.  Neurological: Negative.   Hematological: Negative.   Psychiatric/Behavioral: Negative.     Objective:   Today's Vitals: BP 135/86 (BP Location: Left Arm, Patient Position: Sitting, Cuff Size: Normal)   Pulse 97   Temp 98.1 F (36.7 C) (Oral)   Resp 18   Ht 5\' 6"  (1.676 m)   Wt 180 lb (81.6 kg)   LMP 08/31/2019   SpO2 97%   BMI 29.05 kg/m   Physical Exam Vitals and nursing note reviewed.  Constitutional:      Appearance: Normal appearance.  HENT:     Head: Normocephalic and atraumatic.     Right Ear: Tympanic membrane, ear canal and external ear normal.     Left Ear: Tympanic membrane, ear canal and external ear normal.     Nose: Nose normal.     Mouth/Throat:     Mouth: Mucous membranes are moist.     Pharynx: No oropharyngeal exudate or posterior oropharyngeal erythema.  Eyes:     Extraocular Movements: Extraocular movements intact.     Conjunctiva/sclera: Conjunctivae normal.     Pupils: Pupils are equal, round, and reactive to light.  Cardiovascular:     Rate and Rhythm: Normal rate and regular rhythm.     Pulses: Normal pulses.     Heart sounds: Normal heart sounds.  Pulmonary:     Effort: Pulmonary effort is normal. No respiratory distress.     Breath sounds: Normal breath sounds. Stridor present. No wheezing, rhonchi or rales.  Abdominal:     General: Abdomen is flat. Bowel sounds are normal.     Palpations: Abdomen is soft.  Musculoskeletal:        General: Normal range of motion.     Cervical back: Normal range of motion and neck supple.  Lymphadenopathy:     Cervical: No cervical adenopathy.  Skin:    General: Skin is warm and dry.  Neurological:     General: No focal deficit present.     Mental Status: She is alert and oriented to person, place, and time.  Psychiatric:        Mood and Affect: Mood normal.        Behavior: Behavior normal.        Thought Content: Thought content normal.        Judgment: Judgment normal.      Assessment & Plan:   Problem List Items Addressed This Visit    None    Visit Diagnoses    Upper respiratory tract infection, unspecified type    -  Primary   Relevant Medications   albuterol (PROVENTIL) (2.5 MG/3ML) 0.083% nebulizer solution   Other Relevant Orders   For home use only DME Nebulizer machine  Seasonal allergies          Outpatient Encounter Medications as of 09/28/2019  Medication Sig  . albuterol (PROVENTIL) (2.5 MG/3ML) 0.083% nebulizer solution Take 3 mLs (2.5 mg total) by nebulization every 6 (six) hours as needed for wheezing or shortness of breath.  Marland Kitchen amoxicillin (AMOXIL) 875 MG tablet Take 1 tablet (875 mg total) by mouth 2 (two) times daily.  . brompheniramine-pseudoephedrine-DM 30-2-10 MG/5ML syrup Take 5 mLs by mouth 4 (four) times daily as needed.  . CONCERTA 18 MG CR tablet   . EPINEPHrine 0.3 mg/0.3 mL IJ SOAJ injection   . fexofenadine-pseudoephedrine (ALLEGRA-D ALLERGY & CONGESTION) 180-240 MG 24 hr tablet Take 1 tablet by mouth daily.  Marland Kitchen FLUoxetine (PROZAC) 40 MG capsule Take 1 capsule (40 mg total) by mouth daily.  . fluticasone (FLONASE) 50 MCG/ACT nasal spray Place 1-2 sprays into both nostrils daily for 7 days.  Marland Kitchen NUVARING 0.12-0.015 MG/24HR vaginal ring   . ondansetron (ZOFRAN) 4 MG tablet Take 1 tablet (4 mg total) by mouth every 8 (eight) hours as needed for nausea or vomiting.  . Vitamin D, Ergocalciferol, (DRISDOL) 1.25 MG (50000 UNIT) CAPS capsule Take 1 capsule (50,000 Units total) by mouth every 7 (seven) days.   No facility-administered encounter medications on file as of 09/28/2019.  1. Upper respiratory tract infection, unspecified type Gave patient education on continuing current regimen as prescribed by her primary care provider, continue amoxicillin, red flags given for prompt evaluation at emergency department. - albuterol (PROVENTIL) (2.5 MG/3ML) 0.083% nebulizer solution; Take 3 mLs (2.5 mg total) by nebulization every 6  (six) hours as needed for wheezing or shortness of breath.  Dispense: 150 mL; Refill: 1 - For home use only DME Nebulizer machine  2. Seasonal allergies    I have reviewed the patient's medical history (PMH, PSH, Social History, Family History, Medications, and allergies) , and have been updated if relevant. I spent 20 minutes reviewing chart and  face to face time with patient.       Follow-up: Return if symptoms worsen or fail to improve.   Loraine Grip Mayers, PA-C

## 2019-09-28 NOTE — Telephone Encounter (Signed)
Please advise 

## 2019-09-28 NOTE — Telephone Encounter (Signed)
Called pt cannot LMOVM as it is full. Mychart message also sent.

## 2019-09-29 ENCOUNTER — Telehealth: Payer: Self-pay

## 2019-09-29 NOTE — Telephone Encounter (Signed)
Documents for nebulizer faxed to [facility].

## 2019-09-29 NOTE — Telephone Encounter (Signed)
Pt responded to PCP in mychart.

## 2019-09-30 MED ORDER — AZITHROMYCIN 250 MG PO TABS: ORAL_TABLET | ORAL | 0 refills | Status: DC

## 2019-09-30 MED FILL — AZITHROMYCIN 250 MG TABLET: 250 | 5 days supply | Qty: 6 | Fill #0

## 2019-10-01 ENCOUNTER — Telehealth: Payer: Self-pay

## 2019-10-01 NOTE — Telephone Encounter (Signed)
FYI Patient called in about her appt for tomorrow. She states she needed to cancel her appt for Friday as she just picked up the antibiotic. Patient wanted to be scheduled for Monday in office. I informed her we don't see sick patients in the office. She informed me her PCP wanted to see her in office. I informed her that I needed to check with Dr. Birdie Riddle first before we schedule in office.  I also stated  her that her PCP was booked. Mom got on the phone and stated she did not understand why her daughter could not be see in the office because she has been sick for a week. With PCP approval, patient was scheduled in office for Tuesday morning. Patient was notified and informed that if she was feeling better, she didn't need to be seen. Patient voiced understanding.

## 2019-10-02 ENCOUNTER — Ambulatory Visit: Payer: 59 | Admitting: Family Medicine

## 2019-10-05 ENCOUNTER — Ambulatory Visit (INDEPENDENT_AMBULATORY_CARE_PROVIDER_SITE_OTHER): Payer: 59 | Admitting: Family Medicine

## 2019-10-05 ENCOUNTER — Other Ambulatory Visit: Payer: Self-pay

## 2019-10-05 ENCOUNTER — Encounter: Payer: Self-pay | Admitting: Family Medicine

## 2019-10-05 VITALS — BP 120/80 | HR 78 | Temp 98.0°F | Resp 16 | Wt 178.1 lb

## 2019-10-05 DIAGNOSIS — J069 Acute upper respiratory infection, unspecified: Secondary | ICD-10-CM

## 2019-10-05 MED ORDER — ALBUTEROL SULFATE HFA 108 (90 BASE) MCG/ACT IN AERS: 2.0000 | INHALATION_SPRAY | Freq: Four times a day (QID) | RESPIRATORY_TRACT | 1 refills | Status: DC | PRN

## 2019-10-05 MED ORDER — ONDANSETRON 4 MG PO TBDP: 4.0000 mg | ORAL_TABLET | Freq: Three times a day (TID) | ORAL | 0 refills | Status: DC | PRN

## 2019-10-05 MED FILL — ONDANSETRON ODT 4 MG TABLET: 4 | 6 days supply | Qty: 20 | Fill #0

## 2019-10-05 MED FILL — ALBUTEROL SULFATE HFA 108 (: 108 (90 BAS | 25 days supply | Qty: 18 | Fill #0

## 2019-10-05 NOTE — Progress Notes (Signed)
   Subjective:    Patient ID: Cheryl [name], female    DOB: [birth year], 19 y.o.   MRN: [id number]  HPI URI- pt was seen on 4/9 and dx'd w/ sinusitis.  Started on Amoxicillin.  Saw [facility] on 4/12 and Albuterol was added.  Due to worsening chest symptoms, Azithro was added on 4/14.  Pt reports feeling better but continues to have SOB and chest tightness.  No fevers.  Albuterol is helpful for chest tightness.  Cough is improving.   Review of Systems For ROS see HPI   This visit occurred during the SARS-CoV-2 public health emergency.  Safety protocols were in place, including screening questions prior to the visit, additional usage of staff PPE, and extensive cleaning of exam room while observing appropriate contact time as indicated for disinfecting solutions.       Objective:   Physical Exam Vitals reviewed.  Constitutional:      General: She is not in acute distress.    Appearance: She is well-developed.  HENT:     Head: Normocephalic and atraumatic.  Eyes:     Conjunctiva/sclera: Conjunctivae normal.     Pupils: Pupils are equal, round, and reactive to light.  Cardiovascular:     Rate and Rhythm: Normal rate and regular rhythm.     Heart sounds: Normal heart sounds. No murmur.  Pulmonary:     Effort: Pulmonary effort is normal. No respiratory distress.     Breath sounds: Normal breath sounds. No decreased breath sounds, wheezing or rhonchi.  Musculoskeletal:     Cervical back: Normal range of motion and neck supple.  Lymphadenopathy:     Cervical: No cervical adenopathy.  Skin:    General: Skin is warm and dry.  Neurological:     General: No focal deficit present.     Mental Status: She is alert.           Assessment & Plan:  URI- pt's sxs are much improved since last week.  She continues to have some chest tightness and shortness of breath.  Discussed that this can last 4-6 weeks after an illness but should continue to improve.  Will send in albuterol inhaler  to use PRN.  Reviewed supportive care and red flags that should prompt return.  Pt expressed understanding and is in agreement w/ plan.

## 2019-10-05 NOTE — Patient Instructions (Signed)
Schedule your complete physical this summer A post-infectious cough/chest congestion/shortness of breath can last 4-6 weeks after symptoms improve Use the albuterol inhaler as needed for tightness and shortness of breath Continue your daily allergy medication Drink LOTS of water Call with any questions or concerns Hang in there! Happy Spring!

## 2019-10-06 ENCOUNTER — Ambulatory Visit: Payer: 59 | Admitting: Family Medicine

## 2019-10-30 ENCOUNTER — Ambulatory Visit: Payer: 59 | Admitting: Family Medicine

## 2019-11-03 MED FILL — FLUoxetine HCL 40 MG CAPS: 40 | 90 days supply | Qty: 90 | Fill #0

## 2019-11-10 ENCOUNTER — Telehealth: Payer: Self-pay | Admitting: Family Medicine

## 2019-11-10 NOTE — Telephone Encounter (Signed)
Error

## 2019-11-11 ENCOUNTER — Ambulatory Visit (INDEPENDENT_AMBULATORY_CARE_PROVIDER_SITE_OTHER): Payer: 59 | Admitting: Family Medicine

## 2019-11-11 ENCOUNTER — Other Ambulatory Visit: Payer: Self-pay

## 2019-11-11 ENCOUNTER — Encounter: Payer: Self-pay | Admitting: Family Medicine

## 2019-11-11 VITALS — BP 122/82 | HR 81 | Temp 97.9°F | Resp 16 | Ht 66.0 in | Wt 177.4 lb

## 2019-11-11 DIAGNOSIS — Z0000 Encounter for general adult medical examination without abnormal findings: Secondary | ICD-10-CM

## 2019-11-11 DIAGNOSIS — E663 Overweight: Secondary | ICD-10-CM | POA: Diagnosis not present

## 2019-11-11 DIAGNOSIS — F331 Major depressive disorder, recurrent, moderate: Secondary | ICD-10-CM | POA: Diagnosis not present

## 2019-11-11 DIAGNOSIS — E282 Polycystic ovarian syndrome: Secondary | ICD-10-CM

## 2019-11-11 LAB — CBC WITH DIFFERENTIAL/PLATELET
Basophils Absolute: 0 10*3/uL (ref 0.0–0.1)
Basophils Relative: 0.7 % (ref 0.0–3.0)
Eosinophils Absolute: 0.1 10*3/uL (ref 0.0–0.7)
Eosinophils Relative: 1.9 % (ref 0.0–5.0)
HCT: 38.3 % (ref 36.0–46.0)
Hemoglobin: 12.8 g/dL (ref 12.0–15.0)
Lymphocytes Relative: 21.3 % (ref 12.0–46.0)
Lymphs Abs: 1.2 10*3/uL (ref 0.7–4.0)
MCHC: 33.3 g/dL (ref 30.0–36.0)
MCV: 80.8 fl (ref 78.0–100.0)
Monocytes Absolute: 0.5 10*3/uL (ref 0.1–1.0)
Monocytes Relative: 8.2 % (ref 3.0–12.0)
Neutro Abs: 3.9 10*3/uL (ref 1.4–7.7)
Neutrophils Relative %: 67.9 % (ref 43.0–77.0)
Platelets: 248 10*3/uL (ref 150.0–400.0)
RBC: 4.75 Mil/uL (ref 3.87–5.11)
RDW: 13.9 % (ref 11.5–14.6)
WBC: 5.7 10*3/uL (ref 4.5–10.5)

## 2019-11-11 LAB — HEPATIC FUNCTION PANEL
ALT: 26 U/L (ref 0–35)
AST: 26 U/L (ref 0–37)
Albumin: 4.2 g/dL (ref 3.5–5.2)
Alkaline Phosphatase: 62 U/L (ref 39–117)
Bilirubin, Direct: 0.1 mg/dL (ref 0.0–0.3)
Total Bilirubin: 0.5 mg/dL (ref 0.2–1.2)
Total Protein: 7.1 g/dL (ref 6.0–8.3)

## 2019-11-11 LAB — BASIC METABOLIC PANEL
BUN: 8 mg/dL (ref 6–23)
CO2: 29 mEq/L (ref 19–32)
Calcium: 9.3 mg/dL (ref 8.4–10.5)
Chloride: 102 mEq/L (ref 96–112)
Creatinine, Ser: 0.75 mg/dL (ref 0.40–1.20)
GFR: 97.76 mL/min (ref >60.00)
Glucose, Bld: 88 mg/dL (ref 70–99)
Potassium: 4.4 mEq/L (ref 3.5–5.1)
Sodium: 137 mEq/L (ref 135–145)

## 2019-11-11 LAB — LIPID PANEL
Cholesterol: 150 mg/dL (ref 0–200)
HDL: 49.4 mg/dL (ref >39.00)
LDL Cholesterol: 83 mg/dL (ref 0–99)
NonHDL: 100.3
Total CHOL/HDL Ratio: 3
Triglycerides: 88 mg/dL (ref 0.0–149.0)
VLDL: 17.6 mg/dL (ref 0.0–40.0)

## 2019-11-11 LAB — TSH: TSH: 1.77 u[IU]/mL (ref 0.35–5.50)

## 2019-11-11 LAB — HEMOGLOBIN A1C: Hgb A1c MFr Bld: 5.3 % (ref 4.6–6.5)

## 2019-11-11 NOTE — Progress Notes (Signed)
   Subjective:    Patient ID: Cheryl [name], female    DOB: 10-Feb-2001, 19 y.o.   MRN: [id number]  HPI CPE- UTD on Tdap, COVID.  Pt reports she had HPV vaccines at [facility].  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  No tobacco.  No drug use.  No alcohol.  Not currently in a relationship.     Review of Systems Patient reports no vision/ hearing changes, adenopathy,fever, weight change,  persistant/recurrent hoarseness , swallowing issues, chest pain, palpitations, edema, persistant/recurrent cough, hemoptysis, dyspnea (rest/exertional/paroxysmal nocturnal), gastrointestinal bleeding (melena, rectal bleeding), abdominal pain, significant heartburn, bowel changes, GU symptoms (dysuria, hematuria, incontinence), Gyn symptoms (abnormal  bleeding, pain),  syncope, focal weakness, memory loss, numbness & tingling, skin/hair/nail changes, abnormal bruising or bleeding, anxiety, or depression.     Objective:   Physical Exam General Appearance:    Alert, cooperative, no distress, appears stated age  Head:    Normocephalic, without obvious abnormality, atraumatic  Eyes:    PERRL, conjunctiva/corneas clear, EOM's intact, fundi    benign, both eyes  Ears:    Normal TM's and external ear canals, both ears  Nose:   Deferred due to COVID  Throat:   Neck:   Supple, symmetrical, trachea midline, no adenopathy;    Thyroid: no enlargement/tenderness/nodules  Back:     Symmetric, no curvature, ROM normal, no CVA tenderness  Lungs:     Clear to auscultation bilaterally, respirations unlabored  Chest Wall:    No tenderness or deformity   Heart:    Regular rate and rhythm, S1 and S2 normal, no murmur, rub   or gallop  Breast Exam:    Deferred to GYN  Abdomen:     Soft, non-tender, bowel sounds active all four quadrants,    no masses, no organomegaly  Genitalia:    Deferred to GYN  Rectal:    Extremities:   Extremities normal, atraumatic, no cyanosis or edema  Pulses:    2+ and symmetric all extremities  Skin:   Skin color, texture, turgor normal, no rashes or lesions  Lymph nodes:   Cervical, supraclavicular, and axillary nodes normal  Neurologic:   CNII-XII intact, normal strength, sensation and reflexes    throughout          Assessment & Plan:

## 2019-11-11 NOTE — Assessment & Plan Note (Signed)
Check labs at pt's request.  Stressed need for healthy diet and regular exercise.  will follow.

## 2019-11-11 NOTE — Assessment & Plan Note (Signed)
Following w/ Dr Daron Offer

## 2019-11-11 NOTE — Assessment & Plan Note (Signed)
Pt's PE WNL w/ exception of being overweight.  UTD on Tdap, COVID, HPV (done at [facility]).  She is going to check on Meningitis vaccines.  Check labs.  Anticipatory guidance provided.

## 2019-11-11 NOTE — Patient Instructions (Addendum)
Follow up in 1 year or as needed We'll notify you of your lab results and make any changes if needed Continue to work on healthy diet and regular exercise- you can do it! Check on whether you've had the meningitis vaccines (I suspect you did for college) Keep up the good work in school! Call with any questions or concerns Have a great summer!!

## 2019-11-13 ENCOUNTER — Ambulatory Visit: Payer: 59 | Admitting: Family Medicine

## 2019-11-13 ENCOUNTER — Encounter: Payer: Self-pay | Admitting: Family Medicine

## 2019-11-13 ENCOUNTER — Other Ambulatory Visit: Payer: Self-pay

## 2019-11-13 VITALS — BP 126/82 | HR 125 | Ht 66.0 in | Wt 179.0 lb

## 2019-11-13 DIAGNOSIS — S83002A Unspecified subluxation of left patella, initial encounter: Secondary | ICD-10-CM

## 2019-11-13 DIAGNOSIS — M533 Sacrococcygeal disorders, not elsewhere classified: Secondary | ICD-10-CM

## 2019-11-13 DIAGNOSIS — M999 Biomechanical lesion, unspecified: Secondary | ICD-10-CM | POA: Diagnosis not present

## 2019-11-13 NOTE — Progress Notes (Signed)
[place] [address] [place] Phone: [phone] Subjective:   I Cheryl [name] am serving as a Education administrator for Dr. Hulan Saas.  This visit occurred during the SARS-CoV-2 public health emergency.  Safety protocols were in place, including screening questions prior to the visit, additional usage of staff PPE, and extensive cleaning of exam room while observing appropriate contact time as indicated for disinfecting solutions.   I'm seeing this patient by the request  of:  Birdie Riddle Aundra Millet, MD  CC:   [id number]  Cheryl [name] is a 19 y.o. female coming in with complaint of back pain. Last seen on 09/07/2019 for OMT. Patient states she is doing well.        Past Medical History:  Diagnosis Date  . Allergy   . Anxiety   . Depression   . PCOS (polycystic ovarian syndrome) 11/2016   Past Surgical History:  Procedure Laterality Date  . WISDOM TOOTH EXTRACTION    . WISDOM TOOTH EXTRACTION     Social History   Socioeconomic History  . Marital status: Single    Spouse name: n/a  . Number of children: 0  . Years of education: Not on file  . Highest education level: High school graduate  Occupational History  . Occupation: Lexicographer:  minor    Comment: [place] [employer]or, mining at [employer]  . Smoking status: Former Smoker    Quit date: 10/21/2017    Years since quitting: 2.0  . Smokeless tobacco: Never Used  Substance and Sexual Activity  . Alcohol use: No    Alcohol/week: 0.0 standard drinks  . Drug use: No  . Sexual activity: Never  Other Topics Concern  . Not on file  Social History Narrative   Lives her mom mostly. Parents are divorced.   Social Determinants of Health   Financial Resource Strain:   . Difficulty of Paying Living Expenses:   Food Insecurity:   . Worried About Charity fundraiser in the Last Year:   . Arboriculturist in the Last Year:   Transportation Needs:   .  Film/video editor (Medical):   Marland Kitchen Lack of Transportation (Non-Medical):   Physical Activity:   . Days of Exercise per Week:   . Minutes of Exercise per Session:   Stress:   . Feeling of Stress :   Social Connections:   . Frequency of Communication with Friends and Family:   . Frequency of Social Gatherings with Friends and Family:   . Attends Religious Services:   . Active Member of Clubs or Organizations:   . Attends Archivist Meetings:   Marland Kitchen Marital Status:    No Known Allergies Family History  Problem Relation Age of Onset  . Depression Mother   . Anxiety disorder Mother   . Hyperlipidemia Mother   . Hypertension Mother   . Hypertension Father   . Depression Maternal Uncle   . Hypertension Maternal Grandmother   . Hypertension Maternal Grandfather   . Drug abuse Maternal Grandfather     Current Outpatient Medications (Endocrine & Metabolic):  Marland Kitchen  NUVARING 0.12-0.015 MG/24HR vaginal ring,   Current Outpatient Medications (Cardiovascular):  Marland Kitchen  EPINEPHrine 0.3 mg/0.3 mL IJ SOAJ injection,   Current Outpatient Medications (Respiratory):  .  albuterol (PROVENTIL) (2.5 MG/3ML) 0.083% nebulizer solution, Take 3 mLs (2.5 mg total) by nebulization every 6 (six) hours as needed for wheezing or  shortness of breath. Marland Kitchen  albuterol (VENTOLIN HFA) 108 (90 Base) MCG/ACT inhaler, Inhale 2 puffs into the lungs every 6 (six) hours as needed for wheezing or shortness of breath. .  fexofenadine-pseudoephedrine (ALLEGRA-D ALLERGY & CONGESTION) 180-240 MG 24 hr tablet, Take 1 tablet by mouth daily. .  fluticasone (FLONASE) 50 MCG/ACT nasal spray, Place 1-2 sprays into both nostrils daily for 7 days.    Current Outpatient Medications (Other):  Marland Kitchen  CONCERTA 18 MG CR tablet,  .  ondansetron (ZOFRAN ODT) 4 MG disintegrating tablet, Take 1 tablet (4 mg total) by mouth every 8 (eight) hours as needed for nausea or vomiting. .  ondansetron (ZOFRAN) 4 MG tablet, Take 1 tablet (4 mg total)  by mouth every 8 (eight) hours as needed for nausea or vomiting. Marland Kitchen  FLUoxetine (PROZAC) 40 MG capsule, Take 1 capsule (40 mg total) by mouth daily.   Reviewed prior external information including notes and imaging from  primary care provider As well as notes that were available from care everywhere and other healthcare systems.  Past medical history, social, surgical and family history all reviewed in electronic medical record.  No pertanent information unless stated regarding to the chief complaint.   Review of Systems:  No headache, visual changes, nausea, vomiting, diarrhea, constipation, dizziness, abdominal pain, skin rash, fevers, chills, night sweats, weight loss, swollen lymph nodes, body aches, joint swelling, chest pain, shortness of breath, mood changes. POSITIVE muscle aches  Objective  Blood pressure 126/82, pulse (!) 125, height 5\' 6"  (1.676 m), weight 179 lb (81.2 kg), SpO2 98 %.   General: No apparent distress alert and oriented x3 mood and affect normal, dressed appropriately.  HEENT: Pupils equal, extraocular movements intact  Respiratory: Patient's speak in full sentences and does not appear short of breath  Cardiovascular: No lower extremity edema, non tender, no erythema  Neuro: Cranial nerves II through XII are intact, neurovascularly intact in all extremities with 2+ DTRs and 2+ pulses.  Gait normal with good balance and coordination.  MSK: Left knee left knee does have lateral tracking of the patella noted.  Mild weakness of the VMO compared to the contralateral side.  Mild positive patellar grind test still noted.  Low back exam tender to palpation in the paraspinal musculature mostly of the thoracolumbar and lumbosacral areas.  Mild tightness with Corky Sox on the right side.  Negative straight leg test.  Osteopathic findings   T7 extended rotated and side bent left L3 flexed rotated and side bent right Sacrum right on right     Impression and Recommendations:      This case required medical decision making of moderate complexity. The above documentation has been reviewed and is accurate and complete Cheryl Pulley, DO       Note: This dictation was prepared with Dragon dictation along with smaller phrase technology. Any transcriptional errors that result from this process are unintentional.

## 2019-11-13 NOTE — Patient Instructions (Addendum)
Good to see you See me again in 6-12 weeks

## 2019-11-14 ENCOUNTER — Encounter: Payer: Self-pay | Admitting: Family Medicine

## 2019-11-14 DIAGNOSIS — S83002A Unspecified subluxation of left patella, initial encounter: Secondary | ICD-10-CM | POA: Insufficient documentation

## 2019-11-14 NOTE — Assessment & Plan Note (Signed)
tru pull lite given, hx of surgery 18 monbths ago.  Discussed ice, VMO stability  RTC 8 weeks

## 2019-11-14 NOTE — Assessment & Plan Note (Signed)

## 2019-11-14 NOTE — Assessment & Plan Note (Signed)
Chronic problem with mild exacerbation.  Has responded well to osteopathic manipulation.  Discussed medication management but is doing mostly everything conservatively.  Discussed core strengthening.  Follow-up again in 4 to 8 weeks

## 2019-11-15 LAB — TESTOSTERONE, FREE, TOTAL, SHBG
Sex Hormone Binding: 31.5 nmol/L (ref 24.6–122.0)
Testosterone, Free: 3.6 pg/mL (ref 0.0–4.2)
Testosterone: 75 ng/dL — ABNORMAL HIGH (ref 13–71)

## 2019-11-23 DIAGNOSIS — F411 Generalized anxiety disorder: Secondary | ICD-10-CM | POA: Diagnosis not present

## 2019-11-23 DIAGNOSIS — F902 Attention-deficit hyperactivity disorder, combined type: Secondary | ICD-10-CM | POA: Diagnosis not present

## 2019-11-23 DIAGNOSIS — F332 Major depressive disorder, recurrent severe without psychotic features: Secondary | ICD-10-CM | POA: Diagnosis not present

## 2019-12-03 ENCOUNTER — Ambulatory Visit: Payer: 59 | Admitting: Family Medicine

## 2019-12-03 ENCOUNTER — Encounter: Payer: Self-pay | Admitting: Family Medicine

## 2019-12-03 ENCOUNTER — Other Ambulatory Visit: Payer: Self-pay

## 2019-12-03 VITALS — BP 130/80 | HR 89 | Ht 66.0 in | Wt 180.0 lb

## 2019-12-03 DIAGNOSIS — M999 Biomechanical lesion, unspecified: Secondary | ICD-10-CM

## 2019-12-03 DIAGNOSIS — M62838 Other muscle spasm: Secondary | ICD-10-CM

## 2019-12-03 MED ORDER — KETOROLAC TROMETHAMINE 60 MG/2ML IM SOLN
60.0000 mg | Freq: Once | INTRAMUSCULAR | Status: AC
  Administered 2019-12-03: 60 mg via INTRAMUSCULAR

## 2019-12-03 MED FILL — FLUoxetine HCL 40 MG CAPS: 40 | 90 days supply | Qty: 90 | Fill #0

## 2019-12-03 MED FILL — CONCERTA 18 MG TABLET ER: 18 | 90 days supply | Qty: 90 | Fill #0

## 2019-12-03 NOTE — Progress Notes (Signed)
Cheryl [name] [address] [place] Phone: [phone] Subjective:   I [name] am serving as a Education administrator for Dr. Hulan Saas.  This visit occurred during the SARS-CoV-2 public health emergency.  Safety protocols were in place, including screening questions prior to the visit, additional usage of staff PPE, and extensive cleaning of exam room while observing appropriate contact time as indicated for disinfecting solutions.   I'm seeing this patient by the request  of:  Midge Minium, MD  CC: Low back pain follow-up with new onset of neck pain  [id number]  Mikah Rottinghaus [place] is a 19 y.o. female coming in with complaint of neck pain. 11/13/2019 OMT. Patient states she has had intense neck pain since yesterday afternoon. Pain with all ROM. Upper trap. Pain that is radiating to the occiput and her head. Pain is effecting work, sleep and driving. States something feels off.   Medications patient has been prescribed:   Taking:         Reviewed prior external information including notes and imaging from previsou exam, outside providers and external EMR if available.   As well as notes that were available from care everywhere and other healthcare systems.  Past medical history, social, surgical and family history all reviewed in electronic medical record.  No pertanent information unless stated regarding to the chief complaint.   Past Medical History:  Diagnosis Date  . Allergy   . Anxiety   . Depression   . PCOS (polycystic ovarian syndrome) 11/2016    No Known Allergies   Review of Systems:  No headache, visual changes, nausea, vomiting, diarrhea, constipation, dizziness, abdominal pain, skin rash, fevers, chills, night sweats, weight loss, swollen lymph nodes, body aches, joint swelling, chest pain, shortness of breath, mood changes. POSITIVE muscle aches  Objective  Blood pressure 130/80, pulse 89, height 5\' 6"  (1.676  m), weight 180 lb (81.6 kg), SpO2 98 %.   General: No apparent distress alert and oriented x3 mood and affect normal, dressed appropriately.  HEENT: Pupils equal, extraocular movements intact  Respiratory: Patient's speak in full sentences and does not appear short of breath  Cardiovascular: No lower extremity edema, non tender, no erythema  Neuro: Cranial nerves II through XII are intact, neurovascularly intact in all extremities with 2+ DTRs and 2+ pulses.  Gait normal with good balance and coordination.  Neck exam does have significant spasm noted.  Limited range of motion with side bending and rotation.  Negative Spurling's though.  5-5 strength of the upper extremity with full range of motion of the shoulders.  Tightness noted in the parascapular region.  Osteopathic findings  C2 flexed rotated and side bent right C7 flexed rotated and side bent left T3 extended rotated and side bent right inhaled rib T9 extended rotated and side bent left L2 flexed rotated and side bent right Sacrum right on right       Assessment and Plan:  Neck muscle spasm Patient was having more of a neck spasm.  I do believe the patient was doing more lifting overhead when she was in the gym.  Likely causing exacerbation.  Short course of anti-inflammatories and trial given of 800 mg of ibuprofen.  Discussed icing regimen, discussed the potential need for muscle relaxers.  Patient wanted to avoid this though with her driving to the beach.  I believe the patient should do relatively well.  Responded well to manipulation.  Follow-up again in 2 weeks when she  is back in town.  Toradol given today patient did have 1 episode of emesis secondary to vasovagal after the injection before patient was allowed to leave patient was feeling completely better at the time.  No significant headache anymore.  Had an improvement in range of motion in the neck.    Nonallopathic problems  Decision today to treat with OMT was  based on Physical Exam  After verbal consent patient was treated with HVLA, ME, FPR techniques in cervical, rib, thoracic, lumbar, and sacral  areas  Patient tolerated the procedure well with improvement in symptoms  Patient given exercises, stretches and lifestyle modifications  See medications in patient instructions if given  Patient will follow up in 4-8 weeks      The above documentation has been reviewed and is accurate and complete [name], DO       Note: This dictation was prepared with Dragon dictation along with smaller phrase technology. Any transcriptional errors that result from this process are unintentional.

## 2019-12-03 NOTE — Patient Instructions (Signed)
Good to see you Duexis 3 times a day for the next 6 days starting tomorrow Keep hands within peripheral vision for the next month at the gym See me as scheduled

## 2019-12-03 NOTE — Assessment & Plan Note (Addendum)
Patient was having more of a neck spasm.  I do believe the patient was doing more lifting overhead when she was in the gym.  Likely causing exacerbation.  Short course of anti-inflammatories and trial given of 800 mg of ibuprofen.  Discussed icing regimen, discussed the potential need for muscle relaxers.  Patient wanted to avoid this though with her driving to the beach.  I believe the patient should do relatively well.  Responded well to manipulation.  Follow-up again in 2 weeks when she is back in town.  Toradol given today patient did have 1 episode of emesis secondary to vasovagal after the injection before patient was allowed to leave patient was feeling completely better at the time.  No significant headache anymore.  Had an improvement in range of motion in the neck.

## 2019-12-16 DIAGNOSIS — Z6829 Body mass index (BMI) 29.0-29.9, adult: Secondary | ICD-10-CM | POA: Diagnosis not present

## 2019-12-16 DIAGNOSIS — Z01419 Encounter for gynecological examination (general) (routine) without abnormal findings: Secondary | ICD-10-CM | POA: Diagnosis not present

## 2019-12-16 DIAGNOSIS — E282 Polycystic ovarian syndrome: Secondary | ICD-10-CM | POA: Diagnosis not present

## 2019-12-16 MED FILL — ETONOGESTREL-ETHINYL ESTRAD: 0.12-0.015 | 84 days supply | Qty: 3 | Fill #0

## 2019-12-29 ENCOUNTER — Ambulatory Visit: Payer: 59 | Admitting: Family Medicine

## 2019-12-29 NOTE — Progress Notes (Deleted)
  [place] Phone: [phone] Subjective:    I'm seeing this patient by the request  of:  Midge Minium, MD  CC:   [id number]  ONESHA KREBBS is a 19 y.o. female coming in with complaint of back and neck pain. OMT 12/03/2019. Patient states   Medications patient has been prescribed:   Taking:         Reviewed prior external information including notes and imaging from previsou exam, outside providers and external EMR if available.   As well as notes that were available from care everywhere and other healthcare systems.  Past medical history, social, surgical and family history all reviewed in electronic medical record.  No pertanent information unless stated regarding to the chief complaint.   Past Medical History:  Diagnosis Date  . Allergy   . Anxiety   . Depression   . PCOS (polycystic ovarian syndrome) 11/2016    No Known Allergies   Review of Systems:  No headache, visual changes, nausea, vomiting, diarrhea, constipation, dizziness, abdominal pain, skin rash, fevers, chills, night sweats, weight loss, swollen lymph nodes, body aches, joint swelling, chest pain, shortness of breath, mood changes. POSITIVE muscle aches  Objective  There were no vitals taken for this visit.   General: No apparent distress alert and oriented x3 mood and affect normal, dressed appropriately.  HEENT: Pupils equal, extraocular movements intact  Respiratory: Patient's speak in full sentences and does not appear short of breath  Cardiovascular: No lower extremity edema, non tender, no erythema  Neuro: Cranial nerves II through XII are intact, neurovascularly intact in all extremities with 2+ DTRs and 2+ pulses.  Gait normal with good balance and coordination.  MSK:  Non tender with full range of motion and good stability and symmetric strength and tone of shoulders, elbows, wrist, hip, knee and ankles  bilaterally.  Back - Normal skin, Spine with normal alignment and no deformity.  No tenderness to vertebral process palpation.  Paraspinous muscles are not tender and without spasm.   Range of motion is full at neck and lumbar sacral regions  Osteopathic findings  C2 flexed rotated and side bent right C6 flexed rotated and side bent left T3 extended rotated and side bent right inhaled rib T9 extended rotated and side bent left L2 flexed rotated and side bent right Sacrum right on right       Assessment and Plan:    Nonallopathic problems  Decision today to treat with OMT was based on Physical Exam  After verbal consent patient was treated with HVLA, ME, FPR techniques in cervical, rib, thoracic, lumbar, and sacral  areas  Patient tolerated the procedure well with improvement in symptoms  Patient given exercises, stretches and lifestyle modifications  See medications in patient instructions if given  Patient will follow up in 4-8 weeks      The above documentation has been reviewed and is accurate and complete [name]       Note: This dictation was prepared with Dragon dictation along with smaller phrase technology. Any transcriptional errors that result from this process are unintentional.

## 2020-01-13 ENCOUNTER — Ambulatory Visit: Payer: 59 | Admitting: Family Medicine

## 2020-01-14 ENCOUNTER — Other Ambulatory Visit: Payer: Self-pay

## 2020-01-14 ENCOUNTER — Ambulatory Visit: Payer: 59 | Admitting: Family Medicine

## 2020-01-14 ENCOUNTER — Encounter: Payer: Self-pay | Admitting: Family Medicine

## 2020-01-14 VITALS — BP 121/80 | HR 119 | Temp 99.0°F | Resp 17 | Ht 66.0 in | Wt 186.1 lb

## 2020-01-14 DIAGNOSIS — H6983 Other specified disorders of Eustachian tube, bilateral: Secondary | ICD-10-CM | POA: Diagnosis not present

## 2020-01-14 MED ORDER — CLARITIN-D 24 HOUR 10-240 MG PO TB24: 1.0000 | ORAL_TABLET | Freq: Every day | ORAL | 0 refills | Status: AC

## 2020-01-14 MED ORDER — FLUTICASONE PROPIONATE 50 MCG/ACT NA SUSP: 1.0000 | Freq: Every day | NASAL | 0 refills | Status: DC

## 2020-01-14 MED FILL — FLUTICASONE PROP 50 MCG SPR: 50 | 30 days supply | Qty: 16 | Fill #0

## 2020-01-14 NOTE — Patient Instructions (Signed)
Follow up as needed or as scheduled START the Claritin D daily- I sent this to [place] the Flonase- 2 sprays each nostril daily- at least until you fly Drink plenty of fluids Call with any questions or concerns Have a safe (and fun!) trip!

## 2020-01-14 NOTE — Progress Notes (Signed)
° °  Subjective:    Patient ID: Cheryl [name], female    DOB: 11-29-2000, 19 y.o.   MRN: [id number]  HPI URI- 'I got a cold and it was a pretty bad one.  And I can't get the last little bit out of my system'.  Continues to have cough, nasal congestion, sinus pressure, ear fullness.  Ears continue to pop, crackle, ring, have decreased hearing.  Has flight next week to [place].  Not currently feeling sick.  Denies fevers.  Not currently using Flonase or Allergy medication.  Denies tooth pain or facial pain.   Review of Systems For ROS see HPI   This visit occurred during the SARS-CoV-2 public health emergency.  Safety protocols were in place, including screening questions prior to the visit, additional usage of staff PPE, and extensive cleaning of exam room while observing appropriate contact time as indicated for disinfecting solutions.       Objective:   Physical Exam Vitals reviewed.  Constitutional:      General: She is not in acute distress.    Appearance: Normal appearance. She is not ill-appearing.  HENT:     Head: Normocephalic and atraumatic.     Ears:     Comments: TMs markedly retracted bilaterally but otherwise WNL EACs WNL Cardiovascular:     Rate and Rhythm: Normal rate and regular rhythm.  Pulmonary:     Effort: Pulmonary effort is normal. No respiratory distress.     Breath sounds: Normal breath sounds. No wheezing or rhonchi.  Musculoskeletal:     Cervical back: Normal range of motion and neck supple.  Lymphadenopathy:     Cervical: No cervical adenopathy.  Neurological:     Mental Status: She is alert.           Assessment & Plan:  Eustachian Tube Dysfxn- new.  Pt's TMs markedly retracted which is likely accounting ear pain, pressure, decreased hearing.  Restart decongestant and antihistamine.  Restart nasal steroid.  Reviewed supportive care and red flags that should prompt return.  Pt expressed understanding and is in agreement w/ plan.

## 2020-01-15 MED FILL — SM LORATA-DINE D 24HR TABLE: 10-240 | 30 days supply | Qty: 30 | Fill #0

## 2020-02-04 ENCOUNTER — Ambulatory Visit: Payer: 59 | Admitting: Family Medicine

## 2020-02-04 NOTE — Progress Notes (Deleted)
  [place] Phone: [phone] Subjective:    I'm seeing this patient by the request  of:  Midge Minium, MD  CC:   [id number]  Cheryl [name] is a 19 y.o. female coming in with complaint of back and neck pain. OMT 12/03/2019. Patient states   Medications patient has been prescribed:   Taking:         Reviewed prior external information including notes and imaging from previsou exam, outside providers and external EMR if available.   As well as notes that were available from care everywhere and other healthcare systems.  Past medical history, social, surgical and family history all reviewed in electronic medical record.  No pertanent information unless stated regarding to the chief complaint.   Past Medical History:  Diagnosis Date  . Allergy   . Anxiety   . Depression   . PCOS (polycystic ovarian syndrome) 11/2016    No Known Allergies   Review of Systems:  No headache, visual changes, nausea, vomiting, diarrhea, constipation, dizziness, abdominal pain, skin rash, fevers, chills, night sweats, weight loss, swollen lymph nodes, body aches, joint swelling, chest pain, shortness of breath, mood changes. POSITIVE muscle aches  Objective  There were no vitals taken for this visit.   General: No apparent distress alert and oriented x3 mood and affect normal, dressed appropriately.  HEENT: Pupils equal, extraocular movements intact  Respiratory: Patient's speak in full sentences and does not appear short of breath  Cardiovascular: No lower extremity edema, non tender, no erythema  Neuro: Cranial nerves II through XII are intact, neurovascularly intact in all extremities with 2+ DTRs and 2+ pulses.  Gait normal with good balance and coordination.  MSK:  Non tender with full range of motion and good stability and symmetric strength and tone of shoulders, elbows, wrist, hip, knee and ankles  bilaterally.  Back - Normal skin, Spine with normal alignment and no deformity.  No tenderness to vertebral process palpation.  Paraspinous muscles are not tender and without spasm.   Range of motion is full at neck and lumbar sacral regions  Osteopathic findings  C2 flexed rotated and side bent right C6 flexed rotated and side bent left T3 extended rotated and side bent right inhaled rib T9 extended rotated and side bent left L2 flexed rotated and side bent right Sacrum right on right       Assessment and Plan:    Nonallopathic problems  Decision today to treat with OMT was based on Physical Exam  After verbal consent patient was treated with HVLA, ME, FPR techniques in cervical, rib, thoracic, lumbar, and sacral  areas  Patient tolerated the procedure well with improvement in symptoms  Patient given exercises, stretches and lifestyle modifications  See medications in patient instructions if given  Patient will follow up in 4-8 weeks      The above documentation has been reviewed and is accurate and complete [name]       Note: This dictation was prepared with Dragon dictation along with smaller phrase technology. Any transcriptional errors that result from this process are unintentional.

## 2020-02-06 DIAGNOSIS — H60501 Unspecified acute noninfective otitis externa, right ear: Secondary | ICD-10-CM | POA: Diagnosis not present

## 2020-02-06 DIAGNOSIS — H9191 Unspecified hearing loss, right ear: Secondary | ICD-10-CM | POA: Diagnosis not present

## 2020-02-19 ENCOUNTER — Other Ambulatory Visit (HOSPITAL_COMMUNITY): Payer: Self-pay | Admitting: Otolaryngology

## 2020-02-19 DIAGNOSIS — H6980 Other specified disorders of Eustachian tube, unspecified ear: Secondary | ICD-10-CM | POA: Diagnosis not present

## 2020-02-19 DIAGNOSIS — H93293 Other abnormal auditory perceptions, bilateral: Secondary | ICD-10-CM | POA: Diagnosis not present

## 2020-02-19 DIAGNOSIS — H6120 Impacted cerumen, unspecified ear: Secondary | ICD-10-CM | POA: Diagnosis not present

## 2020-02-19 DIAGNOSIS — H6061 Unspecified chronic otitis externa, right ear: Secondary | ICD-10-CM | POA: Diagnosis not present

## 2020-02-19 MED FILL — NEO/POLYMYXIN/HC EAR SOLN: 3.5-10000-1 | 14 days supply | Qty: 10 | Fill #0

## 2020-02-22 ENCOUNTER — Other Ambulatory Visit (HOSPITAL_COMMUNITY): Payer: Self-pay | Admitting: Psychiatry

## 2020-02-22 DIAGNOSIS — F902 Attention-deficit hyperactivity disorder, combined type: Secondary | ICD-10-CM | POA: Diagnosis not present

## 2020-02-22 DIAGNOSIS — F332 Major depressive disorder, recurrent severe without psychotic features: Secondary | ICD-10-CM | POA: Diagnosis not present

## 2020-02-22 DIAGNOSIS — F411 Generalized anxiety disorder: Secondary | ICD-10-CM | POA: Diagnosis not present

## 2020-02-23 DIAGNOSIS — D2261 Melanocytic nevi of right upper limb, including shoulder: Secondary | ICD-10-CM | POA: Diagnosis not present

## 2020-02-23 DIAGNOSIS — D225 Melanocytic nevi of trunk: Secondary | ICD-10-CM | POA: Diagnosis not present

## 2020-02-23 DIAGNOSIS — L700 Acne vulgaris: Secondary | ICD-10-CM | POA: Diagnosis not present

## 2020-02-23 DIAGNOSIS — L738 Other specified follicular disorders: Secondary | ICD-10-CM | POA: Diagnosis not present

## 2020-02-23 DIAGNOSIS — D224 Melanocytic nevi of scalp and neck: Secondary | ICD-10-CM | POA: Diagnosis not present

## 2020-02-23 MED FILL — FLUoxetine HCL 40 MG CAPS: 40 | 90 days supply | Qty: 90 | Fill #0

## 2020-02-24 ENCOUNTER — Ambulatory Visit: Payer: 59 | Admitting: Family Medicine

## 2020-02-29 MED FILL — CONCERTA 18 MG TABLET ER: 18 | 90 days supply | Qty: 90 | Fill #0

## 2020-03-18 ENCOUNTER — Telehealth: Payer: 59 | Admitting: Emergency Medicine

## 2020-03-18 DIAGNOSIS — Z20822 Contact with and (suspected) exposure to covid-19: Secondary | ICD-10-CM | POA: Diagnosis not present

## 2020-03-18 DIAGNOSIS — J069 Acute upper respiratory infection, unspecified: Secondary | ICD-10-CM | POA: Diagnosis not present

## 2020-03-18 MED ORDER — ALBUTEROL SULFATE HFA 108 (90 BASE) MCG/ACT IN AERS
2.0000 | INHALATION_SPRAY | RESPIRATORY_TRACT | 0 refills | Status: DC | PRN
Start: 2020-03-18 — End: 2020-06-29

## 2020-03-18 MED ORDER — BENZONATATE 100 MG PO CAPS
100.0000 mg | ORAL_CAPSULE | Freq: Two times a day (BID) | ORAL | 0 refills | Status: DC | PRN
Start: 2020-03-18 — End: 2021-03-21

## 2020-03-18 MED FILL — BENZONATATE 100 MG CAPS: 100 | 10 days supply | Qty: 20 | Fill #0

## 2020-03-18 MED FILL — CONCERTA 18 MG TABLET ER: 18 | 90 days supply | Qty: 90 | Fill #0

## 2020-03-18 MED FILL — FLUoxetine HCL 40 MG CAPS: 40 | 90 days supply | Qty: 90 | Fill #0

## 2020-03-18 MED FILL — ALBUTEROL SULFATE HFA 108 (: 108 (90 BAS | 16 days supply | Qty: 18 | Fill #0

## 2020-03-18 NOTE — Progress Notes (Signed)
E-Visit for Corona Virus Screening  Your current symptoms could be consistent with the coronavirus.  Many health care providers can now test patients at their office but not all are.  [facility] has multiple testing sites. For information on our COVID testing locations and hours go to [email or web address]  We are enrolling you in our MyChart Home Monitoring for COVID19 . Daily you will receive a questionnaire within the MyChart website. Our COVID 19 response team will be monitoring your responses daily.  Testing Information: The COVID-19 Community Testing sites are testing BY APPOINTMENT ONLY.  You can schedule online at [email or web address]  If you do not have access to a smart phone or computer you may call [phone] for an appointment.   Additional testing sites in the Community:  . For [facility] Testing sites in [place]  https://www.cvs.com/minuteclinic/covid-19-testing  . For Pop-up testing sites in [place]  https://covid19.ncdhhs.gov/about-covid-19/testing/find-my-testing-place/pop-testing-sites  . For [facility] https://[place].gov[place]/human-services/health-department/coronavirus-covid-19-info/covid-19-testing  . For [place] testing in [place] and [place] https://www.[place].gov/[place]/human-services/health-department/coronavirus-covid-19-info/covid-19-testing  . For [facility] testing in [place]   https://[facility].care/covidtesting  For  more information about community testing call [phone]   Please quarantine yourself while awaiting your test results. Please stay home for a minimum of 10 days from the first day of illness with improving symptoms and you have had 24 hours of no fever (without the use of Tylenol (Acetaminophen) Motrin (Ibuprofen) or any fever reducing medication).  Also - Do not get tested prior to returning to work because once you have had a positive test the test can stay  positive for more than a month in some cases.   You should wear a mask or cloth face covering over your nose and mouth if you must be around other people or animals, including pets (even at home). Try to stay at least 6 feet away from other people. This will protect the people around you.  Please continue good preventive care measures, including:  frequent hand-washing, avoid touching your face, cover coughs/sneezes, stay out of crowds and keep a 6 foot distance from others.  COVID-19 is a respiratory illness with symptoms that are similar to the flu. Symptoms are typically mild to moderate, but there have been cases of severe illness and death due to the virus.   The following symptoms may appear 2-14 days after exposure: . Fever . Cough . Shortness of breath or difficulty breathing . Chills . Repeated shaking with chills . Muscle pain . Headache . Sore throat . New loss of taste or smell . Fatigue . Congestion or runny nose . Nausea or vomiting . Diarrhea  Go to the nearest hospital ED for assessment if fever/cough/breathlessness are severe or illness seems like a threat to life.  It is vitally important that if you feel that you have an infection such as this virus or any other virus that you stay home and away from places where you may spread it to others.  You should avoid contact with people age 65 and older.   You can use medication such as A prescription cough medication called Tessalon Perles 100 mg. You may take 1-2 capsules every 8 hours as needed for cough and A prescription inhaler called Albuterol MDI 90 mcg /actuation 2 puffs every 4 hours as needed for shortness of breath, wheezing, cough  You may also take acetaminophen (Tylenol) as needed for fever.  Reduce your risk of any infection by using the same precautions used for avoiding the common cold or flu:  .   Wash your hands often with soap and warm water for at least 20 seconds.  If soap and water are not readily available,  use an alcohol-based hand sanitizer with at least 60% alcohol.  . If coughing or sneezing, cover your mouth and nose by coughing or sneezing into the elbow areas of your shirt or coat, into a tissue or into your sleeve (not your hands). . Avoid shaking hands with others and consider head nods or verbal greetings only. . Avoid touching your eyes, nose, or mouth with unwashed hands.  . Avoid close contact with people who are sick. . Avoid places or events with large numbers of people in one location, like concerts or sporting events. . Carefully consider travel plans you have or are making. . If you are planning any travel outside or inside the [place], visit the CDC's Travelers' Health webpage for the latest health notices. . If you have some symptoms but not all symptoms, continue to monitor at home and seek medical attention if your symptoms worsen. . If you are having a medical emergency, call 911.  HOME CARE . Only take medications as instructed by your medical team. . Drink plenty of fluids and get plenty of rest. . A steam or ultrasonic humidifier can help if you have congestion.   GET HELP RIGHT AWAY IF YOU HAVE EMERGENCY WARNING SIGNS** FOR COVID-19. If you or someone is showing any of these signs seek emergency medical care immediately. Call 911 or proceed to your closest emergency facility if: . You develop worsening high fever. . Trouble breathing . Bluish lips or face . Persistent pain or pressure in the chest . New confusion . Inability to wake or stay awake . You cough up blood. . Your symptoms become more severe  **This list is not all possible symptoms. Contact your medical provider for any symptoms that are sever or concerning to you.  MAKE SURE YOU   Understand these instructions.  Will watch your condition.  Will get help right away if you are not doing well or get worse.  Your e-visit answers were reviewed by a board certified advanced clinical practitioner to  complete your personal care plan.  Depending on the condition, your plan could have included both over the counter or prescription medications.  If there is a problem please reply once you have received a response from your provider.  Your safety is important to [place].  If you have drug allergies check your prescription carefully.    You can use MyChart to ask questions about today's visit, request a non-urgent call back, or ask for a work or school excuse for 24 hours related to this e-Visit. If it has been greater than 24 hours you will need to follow up with your provider, or enter a new e-Visit to address those concerns. You will get an e-mail in the next two days asking about your experience.  I hope that your e-visit has been valuable and will speed your recovery. Thank you for using e-visits.   Approximately 5 minutes was used in reviewing the patient's chart, questionnaire, prescribing medications, and documentation.

## 2020-03-30 ENCOUNTER — Other Ambulatory Visit: Payer: Self-pay | Admitting: Family Medicine

## 2020-03-30 ENCOUNTER — Encounter: Payer: Self-pay | Admitting: Family Medicine

## 2020-03-30 ENCOUNTER — Other Ambulatory Visit: Payer: Self-pay

## 2020-03-30 ENCOUNTER — Ambulatory Visit (INDEPENDENT_AMBULATORY_CARE_PROVIDER_SITE_OTHER): Payer: 59 | Admitting: Family Medicine

## 2020-03-30 VITALS — BP 106/84 | HR 81 | Ht 66.0 in | Wt 198.0 lb

## 2020-03-30 DIAGNOSIS — M999 Biomechanical lesion, unspecified: Secondary | ICD-10-CM

## 2020-03-30 DIAGNOSIS — M533 Sacrococcygeal disorders, not elsewhere classified: Secondary | ICD-10-CM

## 2020-03-30 DIAGNOSIS — M62838 Other muscle spasm: Secondary | ICD-10-CM

## 2020-03-30 MED ORDER — TIZANIDINE HCL 2 MG PO TABS: 2.0000 mg | ORAL_TABLET | Freq: Every day | ORAL | 0 refills | Status: DC

## 2020-03-30 MED FILL — tiZANidine HCL 2 MG TABS: 2 | 30 days supply | Qty: 30 | Fill #0

## 2020-03-30 NOTE — Assessment & Plan Note (Signed)
Tightness noted.  Likely some cervicogenic headaches.  Patient given new exercises for the upper back.  Responded well to manipulation.  Follow-up again in 4 to 8 weeks

## 2020-03-30 NOTE — Assessment & Plan Note (Signed)
Chronic problem with mild exacerbation.  Discussed with patient about icing regimen, home exercise, which activities to doing which wants to avoid.  Patient given Zanaflex for breakthrough pain.  Warned of potential side effects.  Follow-up again in 4 to 8 weeks

## 2020-03-30 NOTE — Progress Notes (Signed)
[facility] [place] Phone: [phone] Subjective:   [name], am serving as a scribe for Dr. Hulan Saas. This visit occurred during the SARS-CoV-2 public health emergency.  Safety protocols were in place, including screening questions prior to the visit, additional usage of staff PPE, and extensive cleaning of exam room while observing appropriate contact time as indicated for disinfecting solutions.   I'm seeing this patient by the request  of:  Midge Minium, MD  CC: Back and neck pain follow-up  [id number]  ONYINYECHI HUANTE is a 19 y.o. female coming in with complaint of back and neck pain. OMT 12/03/2019. Patient states has been having some mild increase in discomfort over the time now.  Patient missed her last appointment.  Feels like if she would have done it more regularly she would do better.  Has had a little more stress with school activity.  Medications patient has been prescribed: Nothing that we have prescribed  Taking: Ibuprofen and Tylenol for increase in headaches         Reviewed prior external information including notes and imaging from previsou exam, outside providers and external EMR if available.   As well as notes that were available from care everywhere and other healthcare systems.  Past medical history, social, surgical and family history all reviewed in electronic medical record.  No pertanent information unless stated regarding to the chief complaint.   Past Medical History:  Diagnosis Date  . Allergy   . Anxiety   . Depression   . PCOS (polycystic ovarian syndrome) 11/2016    No Known Allergies   Review of Systems:  No headache, visual changes, nausea, vomiting, diarrhea, constipation, dizziness, abdominal pain, skin rash, fevers, chills, night sweats, weight loss, swollen lymph nodes, body aches, joint swelling, chest pain, shortness of breath, mood changes.  POSITIVE muscle aches  Objective  Blood pressure 106/84, pulse 81, height 5\' 6"  (1.676 m), weight 198 lb (89.8 kg), SpO2 98 %.   General: No apparent distress alert and oriented x3 mood and affect normal, dressed appropriately.  HEENT: Pupils equal, extraocular movements intact  Respiratory: Patient's speak in full sentences and does not appear short of breath  Cardiovascular: No lower extremity edema, non tender, no erythema  Neuro: Cranial nerves II through XII are intact, neurovascularly intact in all extremities with 2+ DTRs and 2+ pulses.  Gait normal with good balance and coordination.  MSK:  Non tender with full range of motion and good stability and symmetric strength and tone of shoulders, elbows, wrist, hip, knee and ankles bilaterally.  Back -low back exam shows the patient does have some mild loss of lordosis, some tenderness to palpation in the sacroiliac joint right greater than left.  Mild tightness of the Corky Sox on the right side.  Patient does have some pain in the upper parascapular area right greater than left.  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  Discussed with patient about icing regimen, home exercise, which activities to doing which wants to avoid.  Patient given Zanaflex for breakthrough pain.  Warned of potential side effects.  Follow-up again in 4 to 8 weeks  Neck muscle  spasm Tightness noted.  Likely some cervicogenic headaches.  Patient given new exercises for the upper back.  Responded well to manipulation.  Follow-up again in 4 to 8 weeks    Nonallopathic problems  Decision today to treat with OMT was based on Physical Exam  After verbal consent patient was treated with  HVLA, ME, FPR techniques in cervical, rib, thoracic, lumbar, and sacral  areas  Patient tolerated the procedure well with improvement in symptoms  Patient given exercises, stretches and lifestyle modifications  See medications in patient instructions if given  Patient will follow up in 4-8 weeks      The above documentation has been reviewed and is accurate and complete [name], DO       Note: This dictation was prepared with Dragon dictation along with smaller phrase technology. Any transcriptional errors that result from this process are unintentional.

## 2020-03-30 NOTE — Patient Instructions (Signed)
Zanaflex 2mg  as needed at night Scapular exercises Watch posture  See me again in 5-6 weeks

## 2020-04-11 MED FILL — tiZANidine HCL 2 MG TABS: 2 | 30 days supply | Qty: 30 | Fill #0

## 2020-04-11 MED FILL — NEO/POLYMYXIN/HC EAR SOLN: 3.5-10000-1 | 14 days supply | Qty: 10 | Fill #0

## 2020-04-27 NOTE — Progress Notes (Signed)
[facility] [place] Phone: [phone] Subjective:   Cheryl [name], am serving as a scribe for Dr. Hulan Saas. This visit occurred during the SARS-CoV-2 public health emergency.  Safety protocols were in place, including screening questions prior to the visit, additional usage of staff PPE, and extensive cleaning of exam room while observing appropriate contact time as indicated for disinfecting solutions.   I'm seeing this patient by the request  of:  Midge Minium, MD  CC: low back pain follow up   [id number]  Cheryl [name] is a 19 y.o. female coming in with complaint of back and neck pain. OMT 03/30/2020. Patient states that she feels like she usually does when she is ready for OMT. Patient states the Zanaflex helps a lot between her visits.   Medications patient has been prescribed: Zanaflex  Taking: Yes intermittently         Reviewed prior external information including notes and imaging from previsou exam, outside providers and external EMR if available.   As well as notes that were available from care everywhere and other healthcare systems.  Past medical history, social, surgical and family history all reviewed in electronic medical record.  No pertanent information unless stated regarding to the chief complaint.   Past Medical History:  Diagnosis Date  . Allergy   . Anxiety   . Depression   . PCOS (polycystic ovarian syndrome) 11/2016    No Known Allergies   Review of Systems:  No headache, visual changes, nausea, vomiting, diarrhea, constipation, dizziness, abdominal pain, skin rash, fevers, chills, night sweats, weight loss, swollen lymph nodes, body aches, joint swelling, chest pain, shortness of breath, mood changes. POSITIVE muscle aches  Objective  Blood pressure 110/80, pulse 95, height 5\' 6"  (1.676 m), weight 193 lb (87.5 kg), SpO2 96 %.   General: No apparent distress  alert and oriented x3 mood and affect normal, dressed appropriately.  HEENT: Pupils equal, extraocular movements intact  Respiratory: Patient's speak in full sentences and does not appear short of breath  Cardiovascular: No lower extremity edema, non tender, no erythema  Neuro: Cranial nerves II through XII are intact, neurovascularly intact in all extremities with 2+ DTRs and 2+ pulses.  Gait normal with good balance and coordination.  MSK:  Non tender with full range of motion and good stability and symmetric strength and tone of shoulders, elbows, wrist, hip, knee and ankles bilaterally.  Back -low back exam shows the patient does have some tenderness to palpation of the paraspinal musculature of the lumbar spine.  Mild pain of the right sacroiliac joint.  Osteopathic findings  C2 flexed rotated and side bent right C6 flexed rotated and side bent left T3 extended rotated and side bent right inhaled rib T9 extended rotated and side bent left L2 flexed rotated and side bent right Sacrum right on right       Assessment and Plan:  SI (sacroiliac) joint dysfunction Chronic but stable.  Patient has been sitting a little more frequently.  Discussed posture and ergonomics, discussed core strengthening.  Discussed home exercises.  Zanaflex as needed.  Follow-up again in 4 to 8 weeks    Nonallopathic problems  Decision today to treat with OMT was based on Physical Exam  After verbal consent patient was treated with HVLA, ME, FPR techniques in cervical, thoracic, lumbar, and sacral  areas  Patient tolerated the procedure well with improvement in symptoms  Patient given exercises, stretches  and lifestyle modifications  See medications in patient instructions if given  Patient will follow up in 4-8 weeks      The above documentation has been reviewed and is accurate and complete [name], DO       Note: This dictation was prepared with Dragon dictation along with  smaller phrase technology. Any transcriptional errors that result from this process are unintentional.

## 2020-04-29 ENCOUNTER — Other Ambulatory Visit: Payer: Self-pay

## 2020-04-29 ENCOUNTER — Encounter: Payer: Self-pay | Admitting: Family Medicine

## 2020-04-29 ENCOUNTER — Ambulatory Visit (INDEPENDENT_AMBULATORY_CARE_PROVIDER_SITE_OTHER): Payer: 59 | Admitting: Family Medicine

## 2020-04-29 VITALS — BP 110/80 | HR 95 | Ht 66.0 in | Wt 193.0 lb

## 2020-04-29 DIAGNOSIS — M533 Sacrococcygeal disorders, not elsewhere classified: Secondary | ICD-10-CM | POA: Diagnosis not present

## 2020-04-29 DIAGNOSIS — M999 Biomechanical lesion, unspecified: Secondary | ICD-10-CM | POA: Diagnosis not present

## 2020-04-29 NOTE — Assessment & Plan Note (Signed)
Chronic but stable.  Patient has been sitting a little more frequently.  Discussed posture and ergonomics, discussed core strengthening.  Discussed home exercises.  Zanaflex as needed.  Follow-up again in 4 to 8 weeks

## 2020-04-29 NOTE — Patient Instructions (Addendum)
Good to see you  Ice when you need it  Enjoy the ham  See me again in 6-7 weeks

## 2020-05-24 ENCOUNTER — Encounter: Payer: Self-pay | Admitting: Family Medicine

## 2020-05-24 ENCOUNTER — Other Ambulatory Visit: Payer: Self-pay

## 2020-05-24 ENCOUNTER — Other Ambulatory Visit: Payer: Self-pay | Admitting: Family Medicine

## 2020-05-24 ENCOUNTER — Ambulatory Visit: Payer: 59 | Admitting: Family Medicine

## 2020-05-24 VITALS — BP 126/80 | HR 84 | Ht 66.0 in | Wt 196.0 lb

## 2020-05-24 DIAGNOSIS — M533 Sacrococcygeal disorders, not elsewhere classified: Secondary | ICD-10-CM

## 2020-05-24 DIAGNOSIS — M62838 Other muscle spasm: Secondary | ICD-10-CM | POA: Diagnosis not present

## 2020-05-24 DIAGNOSIS — M999 Biomechanical lesion, unspecified: Secondary | ICD-10-CM | POA: Diagnosis not present

## 2020-05-24 MED ORDER — MELOXICAM 7.5 MG PO TABS
7.5000 mg | ORAL_TABLET | Freq: Every day | ORAL | 0 refills | Status: DC
Start: 2020-05-24 — End: 2020-05-24

## 2020-05-24 MED FILL — MELOXICAM 7.5 MG TABLET: 7.5 | 30 days supply | Qty: 30 | Fill #0

## 2020-05-24 NOTE — Assessment & Plan Note (Signed)
More secondary to posture and ergonomics.  Discussed home exercise, icing regimen, which activities to avoid.  Discussed posture and ergonomics.  Follow-up again in 4 to 8 weeks

## 2020-05-24 NOTE — Assessment & Plan Note (Signed)

## 2020-05-24 NOTE — Patient Instructions (Addendum)
Good to see you Meloxicam 7.5 daily for 10 days then as needed  Continue everything else and watch the posture See me again in 4-5 weeks

## 2020-05-24 NOTE — Progress Notes (Signed)
[facility] [address] [place] Phone: [phone] Subjective:   I Cheryl [name] am serving as a Education administrator for Dr. Hulan Saas.  This visit occurred during the SARS-CoV-2 public health emergency.  Safety protocols were in place, including screening questions prior to the visit, additional usage of staff PPE, and extensive cleaning of exam room while observing appropriate contact time as indicated for disinfecting solutions.   I'm seeing this patient by the request  of:  Midge Minium, MD  CC: Neck and back pain follow-up  [id number]  Cheryl [name] is a 19 y.o. female coming in with complaint of back pain. States her neck is painful today.  Patient has had some increasing tightness of her neck.  Does not remember anything that she has been recently.  Has had some mild increase in stress recently.  No radiation down the arms.  With more just pain.  Not taking the medications regularly though.  Patient has been prescribed Zanaflex and meloxicam     Past Medical History:  Diagnosis Date  . Allergy   . Anxiety   . Depression   . PCOS (polycystic ovarian syndrome) 11/2016   Past Surgical History:  Procedure Laterality Date  . WISDOM TOOTH EXTRACTION    . WISDOM TOOTH EXTRACTION     Social History   Socioeconomic History  . Marital status: Single    Spouse name: n/a  . Number of children: 0  . Years of education: Not on file  . Highest education level: High school graduate  Occupational History  . Occupation: Lexicographer:  minor    Comment: [place] [employer], mining at [employer]  . Smoking status: Former Smoker    Quit date: 10/21/2017    Years since quitting: 2.5  . Smokeless tobacco: Never Used  Vaping Use  . Vaping Use: Never used  Substance and Sexual Activity  . Alcohol use: No    Alcohol/week: 0.0 standard drinks  . Drug use: No  . Sexual activity: Never  Other Topics Concern  .  Not on file  Social History Narrative   Lives her mom mostly. Parents are divorced.   Social Determinants of Health   Financial Resource Strain:   . Difficulty of Paying Living Expenses: Not on file  Food Insecurity:   . Worried About Charity fundraiser in the Last Year: Not on file  . Ran Out of Food in the Last Year: Not on file  Transportation Needs:   . Lack of Transportation (Medical): Not on file  . Lack of Transportation (Non-Medical): Not on file  Physical Activity:   . Days of Exercise per Week: Not on file  . Minutes of Exercise per Session: Not on file  Stress:   . Feeling of Stress : Not on file  Social Connections:   . Frequency of Communication with Friends and Family: Not on file  . Frequency of Social Gatherings with Friends and Family: Not on file  . Attends Religious Services: Not on file  . Active Member of Clubs or Organizations: Not on file  . Attends Archivist Meetings: Not on file  . Marital Status: Not on file   No Known Allergies Family History  Problem Relation Age of Onset  . Depression Mother   . Anxiety disorder Mother   . Hyperlipidemia Mother   . Hypertension Mother   . Hypertension Father   . Depression Maternal  Uncle   . Hypertension Maternal Grandmother   . Hypertension Maternal Grandfather   . Drug abuse Maternal Grandfather     Current Outpatient Medications (Endocrine & Metabolic):  Marland Kitchen  NUVARING 0.12-0.015 MG/24HR vaginal ring,   Current Outpatient Medications (Cardiovascular):  Marland Kitchen  EPINEPHrine 0.3 mg/0.3 mL IJ SOAJ injection,   Current Outpatient Medications (Respiratory):  .  albuterol (VENTOLIN HFA) 108 (90 Base) MCG/ACT inhaler, Inhale 2 puffs into the lungs every 4 (four) hours as needed for wheezing or shortness of breath. .  benzonatate (TESSALON) 100 MG capsule, Take 1 capsule (100 mg total) by mouth 2 (two) times daily as needed for cough. .  fexofenadine-pseudoephedrine (ALLEGRA-D ALLERGY & CONGESTION) 180-240  MG 24 hr tablet, Take 1 tablet by mouth daily. .  fluticasone (FLONASE) 50 MCG/ACT nasal spray, Place 1-2 sprays into both nostrils daily. Marland Kitchen  loratadine-pseudoephedrine (CLARITIN-D 24 HOUR) 10-240 MG 24 hr tablet, Take 1 tablet by mouth daily.  Current Outpatient Medications (Analgesics):  .  meloxicam (MOBIC) 7.5 MG tablet, Take 1 tablet (7.5 mg total) by mouth daily.   Current Outpatient Medications (Other):  Marland Kitchen  CONCERTA 18 MG CR tablet,  .  ondansetron (ZOFRAN ODT) 4 MG disintegrating tablet, Take 1 tablet (4 mg total) by mouth every 8 (eight) hours as needed for nausea or vomiting. .  ondansetron (ZOFRAN) 4 MG tablet, Take 1 tablet (4 mg total) by mouth every 8 (eight) hours as needed for nausea or vomiting. Marland Kitchen  tiZANidine (ZANAFLEX) 2 MG tablet, Take 1 tablet (2 mg total) by mouth at bedtime. Marland Kitchen  FLUoxetine (PROZAC) 40 MG capsule, Take 1 capsule (40 mg total) by mouth daily.   Reviewed prior external information including notes and imaging from  primary care provider As well as notes that were available from care everywhere and other healthcare systems.  Past medical history, social, surgical and family history all reviewed in electronic medical record.  No pertanent information unless stated regarding to the chief complaint.   Review of Systems:  No headache, visual changes, nausea, vomiting, diarrhea, constipation, dizziness, abdominal pain, skin rash, fevers, chills, night sweats, weight loss, swollen lymph nodes, body aches, joint swelling, chest pain, shortness of breath, mood changes. POSITIVE muscle aches  Objective  Blood pressure 126/80, pulse 84, height 5\' 6"  (1.676 m), weight 196 lb (88.9 kg), SpO2 99 %.   General: No apparent distress alert and oriented x3 mood and affect normal, dressed appropriately.  HEENT: Pupils equal, extraocular movements intact  Respiratory: Patient's speak in full sentences and does not appear short of breath  Cardiovascular: No lower extremity  edema, non tender, no erythema  Neuro: Cranial nerves II through XII are intact, neurovascularly intact in all extremities with 2+ DTRs and 2+ pulses.  Gait normal with good balance and coordination.  MSK:  Non tender with full range of motion and good stability and symmetric strength and tone of shoulders, elbows, wrist, hip, knee and ankles bilaterally.  Neck exam does have some mild loss of lordosis.  Some tightness noted in the inferior scapular region right greater than left.  Patient does have a slight bruise noted on the right.  Patient's low back also shows tightness more on the right than the left.  Positive Corky Sox.  Negative straight leg test.  5 out of 5 strength  Osteopathic findings C4 flexed rotated and side bent left C6 flexed rotated and side bent left T5 extended rotated and side bent right inhaled third rib L1 flexed rotated and side  bent right Sacrum right on right    Impression and Recommendations:     The above documentation has been reviewed and is accurate and complete [name], DO

## 2020-05-24 NOTE — Assessment & Plan Note (Signed)
Low back exam shows the patient does have some mild loss of lordosis.  Patient did have some increasing discomfort again.  We discussed posture and ergonomics.  Having a mild increase in the neck stiffness.  Encouraged her to take the muscle relaxer on a more regular basis for now.  Follow-up again 4 to 8 weeks

## 2020-06-07 ENCOUNTER — Ambulatory Visit: Payer: 59 | Admitting: Family Medicine

## 2020-06-20 ENCOUNTER — Other Ambulatory Visit: Payer: Self-pay | Admitting: Family Medicine

## 2020-06-20 MED FILL — tiZANidine HCL 2 MG TABS: 2 | 30 days supply | Qty: 30 | Fill #0

## 2020-06-20 MED FILL — FLUoxetine HCL 40 MG CAPS: 40 | 90 days supply | Qty: 90 | Fill #0

## 2020-06-20 NOTE — Telephone Encounter (Signed)
Refill done.  

## 2020-06-23 ENCOUNTER — Telehealth: Payer: Self-pay | Admitting: Family Medicine

## 2020-06-23 ENCOUNTER — Other Ambulatory Visit: Payer: Self-pay

## 2020-06-23 ENCOUNTER — Encounter: Payer: Self-pay | Admitting: Family Medicine

## 2020-06-23 ENCOUNTER — Telehealth: Payer: Self-pay

## 2020-06-23 ENCOUNTER — Ambulatory Visit: Payer: 59 | Admitting: Family Medicine

## 2020-06-23 VITALS — BP 144/80 | HR 75 | Ht 66.0 in | Wt 202.0 lb

## 2020-06-23 DIAGNOSIS — M62838 Other muscle spasm: Secondary | ICD-10-CM | POA: Diagnosis not present

## 2020-06-23 DIAGNOSIS — M999 Biomechanical lesion, unspecified: Secondary | ICD-10-CM | POA: Diagnosis not present

## 2020-06-23 NOTE — Telephone Encounter (Signed)
Called pt in reference to concerns of her phys meds. Pt did not answer and I could not leave a vm because it was full. Per Dr Beverely Low, Pt is to call and schedule an appt to discuss medications.

## 2020-06-23 NOTE — Assessment & Plan Note (Signed)
We will continue tightness of the upper neck and back.  Patient though does respond fairly well to osteopathic manipulation.  More left than right today.  Patient has not been doing as much school work and likely helping with some of the discomfort and pain at the moment.  Patient will be starting school and work again in the near future.  Follow-up with me again in 6 to 12 weeks

## 2020-06-23 NOTE — Telephone Encounter (Signed)
Patient called back and scheduled appt 

## 2020-06-23 NOTE — Progress Notes (Signed)
[facility] [address] [name] Phone: [phone] Subjective:   I Cheryl [name] am serving as a Education administrator for Dr. Hulan Saas.  This visit occurred during the SARS-CoV-2 public health emergency.  Safety protocols were in place, including screening questions prior to the visit, additional usage of staff PPE, and extensive cleaning of exam room while observing appropriate contact time as indicated for disinfecting solutions.   I'm seeing this patient by the request  of:  Midge Minium, MD  CC: Neck and back pain follow-up  [id number]  LEATHA BUCKMASTER is a 20 y.o. female coming in with complaint of back and neck pain. OMT 04/29/2020. Patient states she is doing ok today.  Mild tightness of the left side of the neck.  Patient feels like she has done relatively well though likely secondary to not being in school as much.  Medications patient has been prescribed: Zanaflex and Meloxicam  Taking:         Reviewed prior external information including notes and imaging from previsou exam, outside providers and external EMR if available.   As well as notes that were available from care everywhere and other healthcare systems.  Past medical history, social, surgical and family history all reviewed in electronic medical record.  No pertanent information unless stated regarding to the chief complaint.   Past Medical History:  Diagnosis Date  . Allergy   . Anxiety   . Depression   . PCOS (polycystic ovarian syndrome) 11/2016    No Known Allergies   Review of Systems:  No headache, visual changes, nausea, vomiting, diarrhea, constipation, dizziness, abdominal pain, skin rash, fevers, chills, night sweats, weight loss, swollen lymph nodes, body aches, joint swelling, chest pain, shortness of breath, mood changes. POSITIVE muscle aches  Objective  Blood pressure (!) 144/80, pulse 75, height 5\' 6"  (1.676 m), weight 202 lb (91.6  kg), SpO2 96 %.   General: No apparent distress alert and oriented x3 mood and affect normal, dressed appropriately.  HEENT: Pupils equal, extraocular movements intact  Respiratory: Patient's speak in full sentences and does not appear short of breath  Cardiovascular: No lower extremity edema, non tender, no erythema  Neuro: Cranial nerves II through XII are intact, neurovascularly intact in all extremities with 2+ DTRs and 2+ pulses.  Gait normal with good balance and coordination.  MSK:  Non tender with full range of motion and good stability and symmetric strength and tone of shoulders, elbows, wrist, hip, knee and ankles bilaterally.  Back -low back exam does have some mild tightness around the sacroiliac joint but not as severe as usual for her.  Patient does have a negative straight leg test.  Tightness noted in the left parascapular region no spinous process tenderness.  Full range of motion of the shoulders bilaterally.  Neck very mild tightness in the last 5 degrees of extension  Osteopathic findings  C7 flexed rotated and side bent left T4 extended rotated and side bent left inhaled rib T8 extended rotated and side bent left L2 flexed rotated and side bent right Sacrum right on right       Assessment and Plan: Neck muscle spasm We will continue tightness of the upper neck and back.  Patient though does respond fairly well to osteopathic manipulation.  More left than right today.  Patient has not been doing as much school work and likely helping with some of the discomfort and pain at the moment.  Patient will  be starting school and work again in the near future.  Follow-up with me again in 6 to 12 weeks     Nonallopathic problems  Decision today to treat with OMT was based on Physical Exam  After verbal consent patient was treated with HVLA, ME, FPR techniques in cervical, rib, thoracic, lumbar, and sacral  areas  Patient tolerated the procedure well with improvement in  symptoms  Patient given exercises, stretches and lifestyle modifications  See medications in patient instructions if given  Patient will follow up in 4-8 weeks      The above documentation has been reviewed and is accurate and complete Judi Saa, DO       Note: This dictation was prepared with Dragon dictation along with smaller phrase technology. Any transcriptional errors that result from this process are unintentional.

## 2020-06-23 NOTE — Patient Instructions (Signed)
Good to see you Good luck in school you will do great Watch posture Continue everything else See me again in 6-12 weeks

## 2020-06-23 NOTE — Telephone Encounter (Signed)
Called to inform pt that she can schedule an appt with Dr. Beverely Low at her convenience to discuss her meds. Pt did not answer and does not have a vm.

## 2020-06-23 NOTE — Telephone Encounter (Signed)
Patient would like to discuss her medication - her psychiatrist has gone out of business and she wants to know if Dr. Beverely Low would be able to manage her medications.   This would be her concerta.

## 2020-06-29 ENCOUNTER — Telehealth: Payer: 59 | Admitting: Nurse Practitioner

## 2020-06-29 ENCOUNTER — Other Ambulatory Visit: Payer: Self-pay | Admitting: Nurse Practitioner

## 2020-06-29 DIAGNOSIS — R0602 Shortness of breath: Secondary | ICD-10-CM

## 2020-06-29 DIAGNOSIS — Z20822 Contact with and (suspected) exposure to covid-19: Secondary | ICD-10-CM

## 2020-06-29 DIAGNOSIS — M791 Myalgia, unspecified site: Secondary | ICD-10-CM

## 2020-06-29 DIAGNOSIS — J029 Acute pharyngitis, unspecified: Secondary | ICD-10-CM

## 2020-06-29 MED ORDER — ALBUTEROL SULFATE HFA 108 (90 BASE) MCG/ACT IN AERS: 2.0000 | INHALATION_SPRAY | Freq: Four times a day (QID) | RESPIRATORY_TRACT | 0 refills | Status: DC | PRN

## 2020-06-29 MED FILL — ALBUTEROL SULFATE HFA 108 (: 108 (90 BAS | 30 days supply | Qty: 18 | Fill #0

## 2020-06-29 NOTE — Progress Notes (Signed)
E-Visit for Corona Virus Screening  Your current symptoms could be consistent with the coronavirus.  Many health care providers can now test patients at their office but not all are.  [place] has multiple testing sites. For information on our COVID testing locations and hours go to [email or web address]  Not sure if your rash is related but there have been reports of a rash with covid. I do not know the specifics of the rash. It seems like it is varynig form one person to another.  Testing Information: The COVID-19 Community Testing sites are testing BY APPOINTMENT ONLY.  You can schedule online at [email or web address]  If you do not have access to a smart phone or computer you may call [phone] for an appointment.   Additional testing sites in the Community:  . For [facility] Testing sites in [facility]  FaceUpdate.uy  . For Pop-up testing sites in [place]  BowlDirectory.co.uk  . For [facility] [email or web address]  . For [facility] testing in [place] and [employer] [email or web address]  . For [facility] testing in [facility]   [email or web address]  For  more information about community testing call [phone]   Please quarantine yourself while awaiting your test results. Please stay home for a minimum of 10 days from the first day of illness with improving symptoms and you have had 24 hours of no fever (without the use of Tylenol (Acetaminophen) Motrin (Ibuprofen) or any fever reducing medication).  Also - Do not get tested prior to returning to work because once you have had a positive test the test can stay positive for more than a  month in some cases.   You should wear a mask or cloth face covering over your nose and mouth if you must be around other people or animals, including pets (even at home). Try to stay at least 6 feet away from other people. This will protect the people around you.  Please continue good preventive care measures, including:  frequent hand-washing, avoid touching your face, cover coughs/sneezes, stay out of crowds and keep a 6 foot distance from others.  COVID-19 is a respiratory illness with symptoms that are similar to the flu. Symptoms are typically mild to moderate, but there have been cases of severe illness and death due to the virus.   The following symptoms may appear 2-14 days after exposure: . Fever . Cough . Shortness of breath or difficulty breathing . Chills . Repeated shaking with chills . Muscle pain . Headache . Sore throat . New loss of taste or smell . Fatigue . Congestion or runny nose . Nausea or vomiting . Diarrhea . Unusual rash  Go to the nearest hospital ED for assessment if fever/cough/breathlessness are severe or illness seems like a threat to life.  It is vitally important that if you feel that you have an infection such as this virus or any other virus that you stay home and away from places where you may spread it to others.  You should avoid contact with people age 67 and older.   You can use medication such as A prescription inhaler called Albuterol MDI 90 mcg /actuation 2 puffs every 4 hours as needed for shortness of breath, wheezing, cough  You may also take acetaminophen (Tylenol) as needed for fever.  Reduce your risk of any infection by using the same precautions used for avoiding the common cold or flu:  Marland Kitchen Wash your hands often with soap and warm water for at least 20  seconds.  If soap and water are not readily available, use an alcohol-based hand sanitizer with at least 60% alcohol.  . If coughing or sneezing, cover your mouth and nose by coughing or  sneezing into the elbow areas of your shirt or coat, into a tissue or into your sleeve (not your hands). . Avoid shaking hands with others and consider head nods or verbal greetings only. . Avoid touching your eyes, nose, or mouth with unwashed hands.  . Avoid close contact with people who are sick. . Avoid places or events with large numbers of people in one location, like concerts or sporting events. . Carefully consider travel plans you have or are making. . If you are planning any travel outside or inside the [place], visit the CDC's Travelers' Health webpage for the latest health notices. . If you have some symptoms but not all symptoms, continue to monitor at home and seek medical attention if your symptoms worsen. . If you are having a medical emergency, call 911.  HOME CARE . Only take medications as instructed by your medical team. . Drink plenty of fluids and get plenty of rest. . A steam or ultrasonic humidifier can help if you have congestion.   GET HELP RIGHT AWAY IF YOU HAVE EMERGENCY WARNING SIGNS** FOR COVID-19. If you or someone is showing any of these signs seek emergency medical care immediately. Call 911 or proceed to your closest emergency facility if: . You develop worsening high fever. . Trouble breathing . Bluish lips or face . Persistent pain or pressure in the chest . New confusion . Inability to wake or stay awake . You cough up blood. . Your symptoms become more severe  **This list is not all possible symptoms. Contact your medical provider for any symptoms that are sever or concerning to you.  MAKE SURE YOU   Understand these instructions.  Will watch your condition.  Will get help right away if you are not doing well or get worse.  Your e-visit answers were reviewed by a board certified advanced clinical practitioner to complete your personal care plan.  Depending on the condition, your plan could have included both over the counter or prescription  medications.  If there is a problem please reply once you have received a response from your provider.  Your safety is important to [place].  If you have drug allergies check your prescription carefully.    You can use MyChart to ask questions about today's visit, request a non-urgent call back, or ask for a work or school excuse for 24 hours related to this e-Visit. If it has been greater than 24 hours you will need to follow up with your provider, or enter a new e-Visit to address those concerns. You will get an e-mail in the next two days asking about your experience.  I hope that your e-visit has been valuable and will speed your recovery. Thank you for using e-visits.   5-10 minutes spent reviewing and documenting in chart.

## 2020-07-01 ENCOUNTER — Other Ambulatory Visit (HOSPITAL_COMMUNITY): Payer: Self-pay | Admitting: Dermatology

## 2020-07-01 DIAGNOSIS — L738 Other specified follicular disorders: Secondary | ICD-10-CM | POA: Diagnosis not present

## 2020-07-01 MED FILL — CEPHALEXIN 500 MG CAPSULE: 500 | 10 days supply | Qty: 20 | Fill #0

## 2020-07-01 MED FILL — MUPIROCIN 2% OINTMENT: 2 | 5 days supply | Qty: 22 | Fill #0

## 2020-07-14 ENCOUNTER — Other Ambulatory Visit: Payer: Self-pay | Admitting: Family Medicine

## 2020-07-14 ENCOUNTER — Ambulatory Visit: Payer: 59 | Admitting: Family Medicine

## 2020-07-14 ENCOUNTER — Other Ambulatory Visit: Payer: Self-pay

## 2020-07-14 ENCOUNTER — Encounter: Payer: Self-pay | Admitting: Family Medicine

## 2020-07-14 VITALS — BP 122/74 | HR 75 | Temp 98.6°F | Resp 16 | Ht 66.0 in | Wt 203.0 lb

## 2020-07-14 DIAGNOSIS — F900 Attention-deficit hyperactivity disorder, predominantly inattentive type: Secondary | ICD-10-CM | POA: Diagnosis not present

## 2020-07-14 DIAGNOSIS — F331 Major depressive disorder, recurrent, moderate: Secondary | ICD-10-CM

## 2020-07-14 DIAGNOSIS — F332 Major depressive disorder, recurrent severe without psychotic features: Secondary | ICD-10-CM

## 2020-07-14 DIAGNOSIS — F411 Generalized anxiety disorder: Secondary | ICD-10-CM

## 2020-07-14 MED ORDER — CONCERTA 18 MG PO TBCR: 18.0000 mg | EXTENDED_RELEASE_TABLET | Freq: Every day | ORAL | 0 refills | Status: DC

## 2020-07-14 MED ORDER — FLUOXETINE HCL 40 MG PO CAPS: 40.0000 mg | ORAL_CAPSULE | Freq: Every day | ORAL | 1 refills | Status: DC

## 2020-07-14 MED FILL — FLUoxetine HCL 40 MG CAPS: 40 | 90 days supply | Qty: 90 | Fill #0

## 2020-07-14 MED FILL — CONCERTA 18 MG TABLET ER: 18 | 30 days supply | Qty: 30 | Fill #0

## 2020-07-14 NOTE — Assessment & Plan Note (Signed)
Ongoing issue for pt.  She feels sxs are adequately controlled on her current dose of Prozac 40mg  daily.  She admits there is a seasonal component to her symptoms but doesn't feel that things are overwhelming right now.  No med changes at this time.  Prescription sent to pharmacy.

## 2020-07-14 NOTE — Progress Notes (Signed)
   Subjective:    Patient ID: Cheryl [name], female    DOB: 19-May-2001, 20 y.o.   MRN: [id number]  HPI Anxiety/depression- was seeing Dr Daron Offer.  Currently on Prozac 40mg  daily.  Has been on same medication since age 23.  Pt feels mood is stable.  Admits to some seasonal variation.  But no difficulty getting up, dressed, showered, doing schoolwork.  ADHD- was seeing Dr Daron Offer but he left practice.  Currently on Concerta 18mg  daily.  Pt feels this dose is appropriate and she is able to focus and get things done.  No palpitations, appetite is normal.  Sleeping well.  Review of Systems For ROS see HPI   This visit occurred during the SARS-CoV-2 public health emergency.  Safety protocols were in place, including screening questions prior to the visit, additional usage of staff PPE, and extensive cleaning of exam room while observing appropriate contact time as indicated for disinfecting solutions.       Objective:   Physical Exam Vitals reviewed.  Constitutional:      General: She is not in acute distress.    Appearance: Normal appearance. She is not ill-appearing.  HENT:     Head: Normocephalic and atraumatic.  Eyes:     Extraocular Movements: Extraocular movements intact.     Conjunctiva/sclera: Conjunctivae normal.     Pupils: Pupils are equal, round, and reactive to light.  Cardiovascular:     Rate and Rhythm: Normal rate and regular rhythm.     Pulses: Normal pulses.     Heart sounds: No murmur heard.   Pulmonary:     Effort: Pulmonary effort is normal.     Breath sounds: Normal breath sounds. No wheezing, rhonchi or rales.  Musculoskeletal:     Cervical back: Normal range of motion.  Lymphadenopathy:     Cervical: No cervical adenopathy.  Skin:    General: Skin is warm and dry.  Neurological:     General: No focal deficit present.     Mental Status: She is alert and oriented to person, place, and time.  Psychiatric:        Mood and Affect: Mood normal.         Behavior: Behavior normal.        Thought Content: Thought content normal.           Assessment & Plan:

## 2020-07-14 NOTE — Assessment & Plan Note (Signed)
New to provider, ongoing for pt.  Feels that her current dose of Concerta is appropriate and she is able to focus and complete her tasks.  No adverse effects from medication.  Prescription sent to pharmacy.

## 2020-07-14 NOTE — Patient Instructions (Signed)
Schedule your complete physical in 6 months Let me know at any time if we need to make medication changes or re-evaluate things Call with any questions or concerns Stay Safe!  Stay Healthy! Come on, Spring!!!

## 2020-07-14 NOTE — Assessment & Plan Note (Signed)
Ongoing issue for pt.  She feels sxs are currently well controlled on Prozac.  No changes at this time.  Will follow

## 2020-08-04 NOTE — Progress Notes (Deleted)
  [place] Phone: [phone] Subjective:    I'm seeing this patient by the request  of:  Midge Minium, MD  CC: back pain follow up   [id number]  Cheryl [name] is a 20 y.o. female coming in with complaint of back and neck pain. OMT 06/23/2020. Patient states   Medications patient has been prescribed: Meloxicam, Zanaflex  Taking:         Reviewed prior external information including notes and imaging from previsou exam, outside providers and external EMR if available.   As well as notes that were available from care everywhere and other healthcare systems.  Past medical history, social, surgical and family history all reviewed in electronic medical record.  No pertanent information unless stated regarding to the chief complaint.   Past Medical History:  Diagnosis Date  . Allergy   . Anxiety   . Depression   . PCOS (polycystic ovarian syndrome) 11/2016    No Known Allergies   Review of Systems:  No headache, visual changes, nausea, vomiting, diarrhea, constipation, dizziness, abdominal pain, skin rash, fevers, chills, night sweats, weight loss, swollen lymph nodes, body aches, joint swelling, chest pain, shortness of breath, mood changes. POSITIVE muscle aches  Objective  There were no vitals taken for this visit.   General: No apparent distress alert and oriented x3 mood and affect normal, dressed appropriately.  HEENT: Pupils equal, extraocular movements intact  Respiratory: Patient's speak in full sentences and does not appear short of breath  Cardiovascular: No lower extremity edema, non tender, no erythema  Gait normal with good balance and coordination.  MSK:  Non tender with full range of motion and good stability and symmetric strength and tone of shoulders, elbows, wrist, hip, knee and ankles bilaterally.  Back -low back exam does have some mild loss of doses.  He does have  some tightness around the right sacroiliac joint.  Negative straight leg test.  Mild pain also in the left thoracolumbar juncture.  Patient has some mild tenderness also in the parascapular region left better than right.  Osteopathic findings  C6 flexed rotated and side bent left T4 extended rotated and side bent left inhaled rib T9 extended rotated and side bent left L2 flexed rotated and side bent right Sacrum right on right       Assessment and Plan:  No problem-specific Assessment & Plan notes found for this encounter.    Nonallopathic problems  Decision today to treat with OMT was based on Physical Exam  After verbal consent patient was treated with HVLA, ME, FPR techniques in cervical, rib, thoracic, lumbar, and sacral  areas  Patient tolerated the procedure well with improvement in symptoms  Patient given exercises, stretches and lifestyle modifications  See medications in patient instructions if given  Patient will follow up in 4-8 weeks      The above documentation has been reviewed and is accurate and complete [name], DO       Note: This dictation was prepared with Dragon dictation along with smaller phrase technology. Any transcriptional errors that result from this process are unintentional.

## 2020-08-05 ENCOUNTER — Ambulatory Visit: Payer: 59 | Admitting: Family Medicine

## 2020-08-10 ENCOUNTER — Other Ambulatory Visit: Payer: Self-pay | Admitting: Family Medicine

## 2020-08-10 ENCOUNTER — Encounter: Payer: Self-pay | Admitting: Family Medicine

## 2020-08-10 ENCOUNTER — Other Ambulatory Visit: Payer: Self-pay

## 2020-08-10 ENCOUNTER — Ambulatory Visit: Payer: 59 | Admitting: Family Medicine

## 2020-08-10 VITALS — BP 118/78 | HR 128 | Temp 98.4°F | Resp 19 | Ht 66.0 in | Wt 199.2 lb

## 2020-08-10 DIAGNOSIS — F331 Major depressive disorder, recurrent, moderate: Secondary | ICD-10-CM | POA: Diagnosis not present

## 2020-08-10 DIAGNOSIS — F411 Generalized anxiety disorder: Secondary | ICD-10-CM | POA: Diagnosis not present

## 2020-08-10 MED ORDER — TRAZODONE HCL 50 MG PO TABS: 25.0000 mg | ORAL_TABLET | Freq: Every evening | ORAL | 3 refills | Status: DC | PRN

## 2020-08-10 MED ORDER — ALPRAZOLAM 0.5 MG PO TABS: 0.5000 mg | ORAL_TABLET | Freq: Two times a day (BID) | ORAL | 1 refills | Status: DC | PRN

## 2020-08-10 MED ORDER — FLUOXETINE HCL 20 MG PO CAPS: 20.0000 mg | ORAL_CAPSULE | Freq: Every day | ORAL | 3 refills | Status: DC

## 2020-08-10 MED FILL — traZODone HCL 50 MG TABS: 50 | 30 days supply | Qty: 30 | Fill #0

## 2020-08-10 MED FILL — FLUoxetine HCL 20 MG CAPS: 20 | 30 days supply | Qty: 30 | Fill #0

## 2020-08-10 MED FILL — ALPRAZolam 0.5 MG TABS: 0.5 | 15 days supply | Qty: 30 | Fill #0

## 2020-08-10 NOTE — Progress Notes (Signed)
   Subjective:    Patient ID: Cheryl [name], female    DOB: December 23, 2000, 20 y.o.   MRN: [id number]  HPI GAD/MDD- pt's father died unexpectedly 2/6 from ruptured aneurysm.  She had moved back in w/ dad as of November.  Pt found dad at home.  Pt is not sleeping due to nightmares.  Mom notes 'uncontrollable shakes' during panicked moments.  Pt has since moved back in w/ mom.  Is seeing counseling 2x/week.  Mom reports panic attacks are rare in the last week but do occur when she talks about it (like today) or is otherwise triggered.  Pt is off all medication x3-4 weeks.  Pt was given xanax 0.5mg  prn night or 0.25mg  if during the day by family members.   Review of Systems For ROS see HPI   This visit occurred during the SARS-CoV-2 public health emergency.  Safety protocols were in place, including screening questions prior to the visit, additional usage of staff PPE, and extensive cleaning of exam room while observing appropriate contact time as indicated for disinfecting solutions.       Objective:   Physical Exam Vitals reviewed.  Constitutional:      General: She is in acute distress.     Appearance: She is not ill-appearing.  HENT:     Head: Normocephalic and atraumatic.  Eyes:     Extraocular Movements: Extraocular movements intact.     Conjunctiva/sclera: Conjunctivae normal.     Pupils: Pupils are equal, round, and reactive to light.  Neurological:     General: No focal deficit present.     Mental Status: She is oriented to person, place, and time.  Psychiatric:     Comments: Patient rocking back and forth in chair, at times sobbing uncontrollably.  Hands are constantly moving and manipulating a fidget during our visit.  At times makes twitching or shaking movements.           Assessment & Plan:  GAD/MDD- deteriorated.  Pt has experienced a significant traumatic event in the sudden death of her father and being the one to find his body.  She was having a full blown panic  attack today in the office and we worked hard to focus on her breathing and help her calm down.  Her nightmares are likely a form of PTSD.  Stressed the need of working with her counselor and reassured her that there was nothing she could have done that would have resulted in a different outcome.  We will restart her Prozac but at a lower dose and add Trazodone for sleep.  She is to use the Alprazolam as needed for panicked moments like today.  Mom was with her during today's visit and is a good source of support (they were divorced and mom is remarried).  Pt is aware she can call me at any time and let me know how things are going but we will plan on a 3-4 week follow up.  Pt expressed understanding and is in agreement w/ plan.  Over 45 minutes spent w/ pt, >50% spent counseling and working through her panic attack.

## 2020-08-10 NOTE — Patient Instructions (Signed)
Follow up in 3-4 weeks to recheck mood Take the Prozac 1 tab daily Use the Trazodone at night for sleep- start w/ 1/2 tab Take the Alprazolam as needed for panic Continue to work with your counselor.  This is the most important part Call with any questions or concerns Hang in there!!!!

## 2020-08-11 ENCOUNTER — Other Ambulatory Visit: Payer: Self-pay | Admitting: Family Medicine

## 2020-08-11 MED FILL — FLUTICASONE PROP 50 MCG SPR: 50 | 30 days supply | Qty: 16 | Fill #0

## 2020-08-17 ENCOUNTER — Other Ambulatory Visit: Payer: Self-pay | Admitting: Family Medicine

## 2020-08-17 MED FILL — ONDANSETRON ODT 4 MG TABLET: 4 | 6 days supply | Qty: 20 | Fill #0

## 2020-09-09 ENCOUNTER — Encounter: Payer: Self-pay | Admitting: Family Medicine

## 2020-09-09 ENCOUNTER — Other Ambulatory Visit: Payer: Self-pay

## 2020-09-09 ENCOUNTER — Ambulatory Visit: Payer: 59 | Admitting: Family Medicine

## 2020-09-09 ENCOUNTER — Other Ambulatory Visit: Payer: Self-pay | Admitting: Family Medicine

## 2020-09-09 VITALS — BP 117/70 | HR 93 | Temp 99.2°F | Resp 20 | Ht 66.0 in | Wt 199.2 lb

## 2020-09-09 DIAGNOSIS — F411 Generalized anxiety disorder: Secondary | ICD-10-CM

## 2020-09-09 DIAGNOSIS — Z7184 Encounter for health counseling related to travel: Secondary | ICD-10-CM

## 2020-09-09 DIAGNOSIS — F331 Major depressive disorder, recurrent, moderate: Secondary | ICD-10-CM

## 2020-09-09 MED ORDER — FLUOXETINE HCL 40 MG PO CAPS: 40.0000 mg | ORAL_CAPSULE | Freq: Every day | ORAL | 1 refills | Status: DC

## 2020-09-09 MED FILL — traZODone HCL 50 MG TABS: 50 | 30 days supply | Qty: 30 | Fill #1

## 2020-09-09 MED FILL — FLUoxetine HCL 40 MG CAPS: 40 | 30 days supply | Qty: 30 | Fill #0

## 2020-09-09 NOTE — Assessment & Plan Note (Signed)
Improving since we restarted Fluoxetine at last visit.  She is no longer having panic attacks but continues to have 'anxiety attacks' which she reports are less severe.  There is still definite room for improvement so we will increase dose to 40mg  daily and continue to monitor.  She is still to use the Alprazolam as needed.  Pt expressed understanding and is in agreement w/ plan.

## 2020-09-09 NOTE — Progress Notes (Signed)
   Subjective:    Patient ID: Cheryl [name], female    DOB: May 16, 2001, 20 y.o.   MRN: [id number]  HPI GAD- pt was restarted on Fluoxetine last visit after the death of her father.  She was also to use the Trazodone for sleep and the Alprazolam as needed.  Today she scores a 17 on the PHQ9.  Pt reports panic attacks are 'better' and have only occurred 1-2x since last visit.  Pt reports she continues to have anxiety attacks- 'which creep up more slowly, are less intense, and it doesn't feel like I'm going to die'.  Pt is currently in counseling.  Mom feels like 'she's doing better' but coming here today is a trigger.  Pt is currently staying w/ friends from church while looking for another place to live.  She is on edge b/c her dog Occupational psychologist) got in a fight with the dog she is staying with.  Has 1 class this semester (has fallen behind), working at [employer].  Pt is very angry at this point.  Travel advice- pt has decided to go to [place] on a mission trip and wants to know what vaccines she needs   Review of Systems For ROS see HPI   This visit occurred during the SARS-CoV-2 public health emergency.  Safety protocols were in place, including screening questions prior to the visit, additional usage of staff PPE, and extensive cleaning of exam room while observing appropriate contact time as indicated for disinfecting solutions.       Objective:   Physical Exam Vitals reviewed.  Constitutional:      General: She is not in acute distress.    Appearance: Normal appearance. She is not ill-appearing.  HENT:     Head: Normocephalic and atraumatic.  Eyes:     Extraocular Movements: Extraocular movements intact.     Conjunctiva/sclera: Conjunctivae normal.     Pupils: Pupils are equal, round, and reactive to light.  Skin:    General: Skin is warm and dry.  Neurological:     General: No focal deficit present.     Mental Status: She is alert and oriented to person, place, and  time.  Psychiatric:        Thought Content: Thought content normal.     Comments: Angry, withdrawn today           Assessment & Plan:  Travel advice- new.  Pt is going to [place] this summer.  Encouraged her to look at [email or web address] and call the [facility].  Told her that while I'm not up on all the needed vaccines to go to [place], I strongly encouraged her to get her COVID booster prior to traveling and actually ASAP.  Pt agreeable to this.

## 2020-09-09 NOTE — Patient Instructions (Signed)
Follow up in 6-8 weeks to recheck anxiety and depression INCREASE the Fluoxetine (Prozac) to 40mg  daily.  New prescription was sent It's ok to have ALL the feelings- good, bad, and ugly.  And it's ok to have them on your terms, on your time table Continue to work with your counselor.  This is SO important Check out the [facility] I strongly recommend your booster! Call with any questions or concerns Hang in there!

## 2020-09-09 NOTE — Assessment & Plan Note (Signed)
Somewhat better since restarting Fluoxetine and Trazodone but pt is still grieving and now is uncertain about her housing plans- clearly a stress she did not need.  Will increase Prozac to 40mg  daily and monitor closely.  Encouraged her to continue her counseling to work through this grief process.  Discussed that this is not linear and that she is entitled to any and all emotions that she may be feeling.  Pt expressed understanding and is in agreement w/ plan.

## 2020-09-12 NOTE — Progress Notes (Unsigned)
[facility] [place] [address] [place] Phone: [phone] Subjective:   I Cheryl [name] am serving as a Education administrator for Dr. Hulan Saas.  This visit occurred during the SARS-CoV-2 public health emergency.  Safety protocols were in place, including screening questions prior to the visit, additional usage of staff PPE, and extensive cleaning of exam room while observing appropriate contact time as indicated for disinfecting solutions.   I'm seeing this patient by the request  of:  Midge Minium, MD  CC: neck pain   [id number]  Cheryl [name] is a 20 y.o. female coming in with complaint of back and neck pain. OMT 06/23/2020. Patient states she is doing well.  Has had some tightness.  Patient had to reschedule her appointment secondary to her father dying recently.  Patient feels like she has good support.  Still having difficulty but he is feeling like she is doing relatively well and working with her primary care physician as well.  Medications patient has been prescribed: Zanaflex         Reviewed prior external information including notes and imaging from previsou exam, outside providers and external EMR if available.   As well as notes that were available from care everywhere and other healthcare systems.  Past medical history, social, surgical and family history all reviewed in electronic medical record.  No pertanent information unless stated regarding to the chief complaint.   Past Medical History:  Diagnosis Date  . Allergy   . Anxiety   . Depression   . PCOS (polycystic ovarian syndrome) 11/2016    No Known Allergies   Review of Systems:  No headache, visual changes, nausea, vomiting, diarrhea, constipation, dizziness, abdominal pain, skin rash, fevers, chills, night sweats, weight loss, swollen lymph nodes, body aches, joint swelling, chest pain, shortness of breath,  POSITIVE muscle aches, mood changes but secondary to  recent loss of father   Objective  Blood pressure 110/78, pulse 78, height 5\' 6"  (1.676 m), weight 198 lb (89.8 kg), SpO2 98 %.   General: No apparent distress alert and oriented x3 mood and affect normal, dressed appropriately.  HEENT: Pupils equal, extraocular movements intact  Respiratory: Patient's speak in full sentences and does not appear short of breath  Cardiovascular: No lower extremity edema, non tender, no erythema  Gait normal with good balance and coordination.  MSK:  Non tender with full range of motion and good stability and symmetric strength and tone of shoulders, elbows, wrist, hip, knee and ankles bilaterally.  Back -mild loss of lordosis.  Patient does have more tightness noted all the way from the cervical to thoracic area.  Patient has not been quite as active she states. Mild increase in tightness with side bending of the neck bilaterally as well.   Osteopathic findings   T9 extended rotated and side bent left L2 flexed rotated and side bent right Sacrum right on right       Assessment and Plan: SI (sacroiliac) joint dysfunction Chronic, with mild exacerbation, has not been quite as active.  Patient has been dealing with the loss of her father.  Encourage patient that if she needs anything she can call us.  Encourage patient to increase activity slowly.  Discussed icing regimen and home exercises.  Patient is to follow-up with me again 4 to 5 weeks.     Nonallopathic problems  Decision today to treat with OMT was based on Physical Exam  After verbal consent patient was  treated with HVLA, ME, techniques in  thoracic, lumbar, and sacral  areas  Patient tolerated the procedure well with improvement in symptoms  Patient given exercises, stretches and lifestyle modifications  See medications in patient instructions if given  Patient will follow up in 4-8 weeks      The above documentation has been reviewed and is accurate and complete [name], DO       Note: This dictation was prepared with Dragon dictation along with smaller phrase technology. Any transcriptional errors that result from this process are unintentional.

## 2020-09-13 ENCOUNTER — Other Ambulatory Visit: Payer: Self-pay | Admitting: Family Medicine

## 2020-09-13 ENCOUNTER — Ambulatory Visit: Payer: 59 | Admitting: Family Medicine

## 2020-09-13 ENCOUNTER — Encounter: Payer: Self-pay | Admitting: Family Medicine

## 2020-09-13 ENCOUNTER — Other Ambulatory Visit: Payer: Self-pay

## 2020-09-13 DIAGNOSIS — M533 Sacrococcygeal disorders, not elsewhere classified: Secondary | ICD-10-CM | POA: Diagnosis not present

## 2020-09-13 DIAGNOSIS — M999 Biomechanical lesion, unspecified: Secondary | ICD-10-CM | POA: Diagnosis not present

## 2020-09-13 MED ORDER — TIZANIDINE HCL 2 MG PO TABS: 2.0000 mg | ORAL_TABLET | Freq: Every day | ORAL | 0 refills | Status: DC

## 2020-09-13 MED ORDER — MELOXICAM 7.5 MG PO TABS: 7.5000 mg | ORAL_TABLET | Freq: Every day | ORAL | 0 refills | Status: DC

## 2020-09-13 MED FILL — MELOXICAM 7.5 MG TABLET: 7.5 | 15 days supply | Qty: 15 | Fill #0

## 2020-09-13 MED FILL — tiZANidine HCL 2 MG TABS: 2 | 15 days supply | Qty: 15 | Fill #0

## 2020-09-13 NOTE — Patient Instructions (Addendum)
Good to see you Sorry for your loss Here if you need anything Refilled muscle relaxer See me again in 5-6 weeks

## 2020-09-13 NOTE — Assessment & Plan Note (Signed)
Chronic, with mild exacerbation, has not been quite as active.  Patient has been dealing with the loss of her father.  Encourage patient that if she needs anything she can call us.  Encourage patient to increase activity slowly.  Discussed icing regimen and home exercises.  Patient is to follow-up with me again 4 to 5 weeks.

## 2020-09-21 NOTE — Progress Notes (Signed)
[place] Phone: [phone] Subjective:   [name], am serving as a scribe for Dr. Hulan Saas. This visit occurred during the SARS-CoV-2 public health emergency.  Safety protocols were in place, including screening questions prior to the visit, additional usage of staff PPE, and extensive cleaning of exam room while observing appropriate contact time as indicated for disinfecting solutions.   I'm seeing this patient by the request  of:  Midge Minium, MD  CC: Neck and back pain,  [id number]  KRESTA TEMPLEMAN is a 20 y.o. female coming in with complaint of back and neck pain. OMT 09/13/2020. Patient states that her neck pain is worse as she moved and has been stressed recently.  Patient feels like this is contributing to some of the pain.  Patient states woke up and started having increasing in the neck pain again.  Has been taking a muscle relaxer though that has helped at least at night.  Medications patient has been prescribed:zanaflex         Reviewed prior external information including notes and imaging from previsou exam, outside providers and external EMR if available.   As well as notes that were available from care everywhere and other healthcare systems.  Past medical history, social, surgical and family history all reviewed in electronic medical record.  No pertanent information unless stated regarding to the chief complaint.   Past Medical History:  Diagnosis Date  . Allergy   . Anxiety   . Depression   . PCOS (polycystic ovarian syndrome) 11/2016    No Known Allergies   Review of Systems:  No headache, visual changes, nausea, vomiting, diarrhea, constipation, dizziness, abdominal pain, skin rash, fevers, chills, night sweats, weight loss, swollen lymph nodes, body aches, joint swelling, chest pain, shortness of breath, mood changes. POSITIVE muscle aches  Objective   Blood pressure 100/70, pulse 83, height 5\' 6"  (1.676 m), weight 200 lb (90.7 kg), SpO2 97 %.   General: No apparent distress alert and oriented x3 mood and affect normal, dressed appropriately.  HEENT: Pupils equal, extraocular movements intact  Respiratory: Patient's speak in full sentences and does not appear short of breath  Cardiovascular: No lower extremity edema, non tender, no erythema  Gait normal with good balance and coordination.  MSK:  Non tender with full range of motion and good stability and symmetric strength and tone of shoulders, elbows, wrist, hip, knee and ankles bilaterally.  Back -low back exam he does have some very mild loss of lordosis.  Significant tightness noted of the trapezius bilaterally.  Tightness with side bending of the neck bilaterally.  Negative Spurling's noted.  Osteopathic findings  C5 flexed rotated and side bent left T3 extended rotated and side bent right inhaled rib T9 extended rotated and side bent left L1 flexed rotated and side bent right Sacrum right on right       Assessment and Plan:  Neck muscle spasm Patient is having more worsening neck pain.  Seems to be more of a spasm.  Has been under a lot of stress.  Patient will hold on meloxicam.  Given Toradol and Depo-Medrol today which will hopefully be beneficial.  Patient does have Zanaflex at night.  Patient will follow up with me again in 2 to 3 weeks.    Nonallopathic problems  Decision today to treat with OMT was based on Physical Exam  After verbal consent patient was treated with HVLA, ME,  FPR techniques in cervical, rib, thoracic, lumbar, and sacral  areas  Patient tolerated the procedure well with improvement in symptoms  Patient given exercises, stretches and lifestyle modifications  See medications in patient instructions if given  Patient will follow up in 4-8 weeks      The above documentation has been reviewed and is accurate and complete [name],  DO       Note: This dictation was prepared with Dragon dictation along with smaller phrase technology. Any transcriptional errors that result from this process are unintentional.

## 2020-09-22 ENCOUNTER — Other Ambulatory Visit: Payer: Self-pay

## 2020-09-22 ENCOUNTER — Encounter: Payer: Self-pay | Admitting: Family Medicine

## 2020-09-22 ENCOUNTER — Ambulatory Visit: Payer: 59 | Admitting: Family Medicine

## 2020-09-22 VITALS — BP 100/70 | HR 83 | Ht 66.0 in | Wt 200.0 lb

## 2020-09-22 DIAGNOSIS — M62838 Other muscle spasm: Secondary | ICD-10-CM

## 2020-09-22 DIAGNOSIS — M999 Biomechanical lesion, unspecified: Secondary | ICD-10-CM | POA: Diagnosis not present

## 2020-09-22 MED ORDER — METHYLPREDNISOLONE ACETATE 40 MG/ML IJ SUSP
40.0000 mg | Freq: Once | INTRAMUSCULAR | Status: AC
  Administered 2020-09-22: 40 mg via INTRAMUSCULAR

## 2020-09-22 MED ORDER — KETOROLAC TROMETHAMINE 30 MG/ML IJ SOLN
30.0000 mg | Freq: Once | INTRAMUSCULAR | Status: AC
  Administered 2020-09-22: 30 mg via INTRAMUSCULAR

## 2020-09-22 NOTE — Assessment & Plan Note (Signed)
Patient is having more worsening neck pain.  Seems to be more of a spasm.  Has been under a lot of stress.  Patient will hold on meloxicam.  Given Toradol and Depo-Medrol today which will hopefully be beneficial.  Patient does have Zanaflex at night.  Patient will follow up with me again in 2 to 3 weeks.

## 2020-09-22 NOTE — Patient Instructions (Signed)
Good to see you 2 injections today Doctor's order to do something for yourself today Overall doing well you are just tight See me again in 2-3 weeks

## 2020-09-28 ENCOUNTER — Other Ambulatory Visit (HOSPITAL_COMMUNITY): Payer: Self-pay

## 2020-09-28 MED FILL — Alprazolam Tab 0.5 MG: ORAL | 15 days supply | Qty: 30 | Fill #0 | Status: AC

## 2020-10-05 NOTE — Progress Notes (Deleted)
  [place] Phone: [phone] Subjective:    I'm seeing this patient by the request  of:  Midge Minium, MD  CC: Back and neck pain follow-up  [id number]  Cheryl [name] is a 20 y.o. female coming in with complaint of back and neck pain. OMT 09/22/2020.  Patient was having significant increase in tightness of the neck and was having increasing stress.  Patient states   Medications patient has been prescribed: Zanaflex  Taking: Yes         Reviewed prior external information including notes and imaging from previsou exam, outside providers and external EMR if available.   As well as notes that were available from care everywhere and other healthcare systems.  Past medical history, social, surgical and family history all reviewed in electronic medical record.  No pertanent information unless stated regarding to the chief complaint.   Past Medical History:  Diagnosis Date  . Allergy   . Anxiety   . Depression   . PCOS (polycystic ovarian syndrome) 11/2016    No Known Allergies   Review of Systems:  No headache, visual changes, nausea, vomiting, diarrhea, constipation, dizziness, abdominal pain, skin rash, fevers, chills, night sweats, weight loss, swollen lymph nodes, body aches, joint swelling, chest pain, shortness of breath, mood changes. POSITIVE muscle aches  Objective  There were no vitals taken for this visit.   General: No apparent distress alert and oriented x3 mood and affect normal, dressed appropriately.  HEENT: Pupils equal, extraocular movements intact  Respiratory: Patient's speak in full sentences and does not appear short of breath  Cardiovascular: No lower extremity edema, non tender, no erythema  Gait normal with good balance and coordination.  MSK:  Non tender with full range of motion and good stability and symmetric strength and tone of shoulders, elbows, wrist, hip,  knee and ankles bilaterally.  Back -   Osteopathic findings  C6 flexed rotated and side bent left T3 extended rotated and side bent right inhaled rib T8 extended rotated and side bent left L3 flexed rotated and side bent right Sacrum right on right      Assessment and Plan:  No problem-specific Assessment & Plan notes found for this encounter.    Nonallopathic problems  Decision today to treat with OMT was based on Physical Exam  After verbal consent patient was treated with HVLA, ME, FPR techniques in cervical, rib, thoracic, lumbar, and sacral  areas  Patient tolerated the procedure well with improvement in symptoms  Patient given exercises, stretches and lifestyle modifications  See medications in patient instructions if given  Patient will follow up in 4-8 weeks      The above documentation has been reviewed and is accurate and complete [name], DO       Note: This dictation was prepared with Dragon dictation along with smaller phrase technology. Any transcriptional errors that result from this process are unintentional.

## 2020-10-06 ENCOUNTER — Other Ambulatory Visit (HOSPITAL_COMMUNITY): Payer: Self-pay

## 2020-10-06 ENCOUNTER — Ambulatory Visit: Payer: 59 | Admitting: Family Medicine

## 2020-10-06 DIAGNOSIS — H52223 Regular astigmatism, bilateral: Secondary | ICD-10-CM | POA: Diagnosis not present

## 2020-10-06 DIAGNOSIS — H5213 Myopia, bilateral: Secondary | ICD-10-CM | POA: Diagnosis not present

## 2020-10-06 MED ORDER — CYCLOSPORINE 0.05 % OP EMUL
1.0000 [drp] | Freq: Two times a day (BID) | OPHTHALMIC | 3 refills | Status: DC
  Filled 2020-10-06 – 2021-09-08 (×2): qty 180, 90d supply, fill #0

## 2020-10-07 ENCOUNTER — Other Ambulatory Visit (HOSPITAL_COMMUNITY): Payer: Self-pay

## 2020-10-14 ENCOUNTER — Other Ambulatory Visit (HOSPITAL_COMMUNITY): Payer: Self-pay

## 2020-10-15 ENCOUNTER — Other Ambulatory Visit (HOSPITAL_COMMUNITY): Payer: Self-pay

## 2020-10-17 ENCOUNTER — Other Ambulatory Visit (HOSPITAL_COMMUNITY): Payer: Self-pay

## 2020-10-17 MED FILL — Trazodone HCl Tab 50 MG: ORAL | 30 days supply | Qty: 30 | Fill #0 | Status: AC

## 2020-10-17 MED FILL — Fluoxetine HCl Cap 40 MG: ORAL | 90 days supply | Qty: 90 | Fill #0 | Status: AC

## 2020-10-19 ENCOUNTER — Other Ambulatory Visit (HOSPITAL_COMMUNITY): Payer: Self-pay

## 2020-10-20 NOTE — Progress Notes (Signed)
[place] Phone: [phone] Subjective:   Cheryl [name], am serving as a scribe for Dr. Hulan Saas. This visit occurred during the SARS-CoV-2 public health emergency.  Safety protocols were in place, including screening questions prior to the visit, additional usage of staff PPE, and extensive cleaning of exam room while observing appropriate contact time as indicated for disinfecting solutions.   I'm seeing this patient by the request  of:  Midge Minium, MD  CC: Upper back and neck pain follow-up  [id number]  Cheryl [name] is a 20 y.o. female coming in with complaint of back and neck pain. OMT 09/22/2020. Patient states that her upper back pain has increased recently. Taking zanaflex 2-3x a week recently.  Patient states that he just seems to be more tired.  Notices when she is at the computer on a more regular basis has more discomfort as well.  Medications patient has been prescribed: Zanaflex  Taking:         Reviewed prior external information including notes and imaging from previsou exam, outside providers and external EMR if available.   As well as notes that were available from care everywhere and other healthcare systems.  Past medical history, social, surgical and family history all reviewed in electronic medical record.  [name] pertanent information unless stated regarding to the chief complaint.   Past Medical History:  Diagnosis Date  . Allergy   . Anxiety   . Depression   . PCOS (polycystic ovarian syndrome) 11/2016    [name] Known Allergies   Review of Systems:  [name] headache, visual changes, nausea, vomiting, diarrhea, constipation, dizziness, abdominal pain, skin rash, fevers, chills, night sweats, weight loss, swollen lymph nodes,  joint swelling, chest pain, shortness of breath, mood changes. POSITIVE muscle aches, body aches  Objective  Blood pressure 100/70, pulse 92,  height 5\' 6"  (1.676 m), weight 202 lb (91.6 kg), SpO2 97 %.   General: [name], dressed appropriately.  HEENT: Pupils equal, extraocular movements intact  Respiratory: Patient's speak in full sentences and does not appear short of breath  Cardiovascular: [name], non tender, [name]  Gait normal with good balance and coordination.  MSK:  Non tender with full range of motion and good stability and symmetric strength and tone of shoulders, elbows, wrist, hip, knee and ankles bilaterally.  Back -low back exam does have some mild loss of lordosis.  Some tenderness to palpation in the paraspinal musculature of the lumbar spine.  Mild pain over the right sacroiliac joint.  Patient does have significant tightness in the parascapular region and actually more bilateral than usual.  Neck exam also has some tightness noted on the left side of the paraspinal musculature negative Spurling's.  Osteopathic findings  C6 flexed rotated and side bent left T3 extended rotated and side bent right inhaled rib T5 extended rotated and side bent left with inhaled rib L2 flexed rotated and side bent right Sacrum right on right       Assessment and Plan:  SI (sacroiliac) joint dysfunction Chronic problem with exacerbation.  Patient has had difficulty focusing on herself at this time.  Patient continues to not be able to work out as regularly.  Still mourning the death of her father.  Having some difficulty with school.  Patient though is optimistic that things will get better.  Given her some meloxicam to  help her with some more of the pain control.  Increased dose from 7.5 to 15 mg.  Encouraged her to use it in 3 to 5-day burst.  Patient will start doing the exercises more regularly again and follow-up with me again 6 to 8 weeks.    Nonallopathic problems  Decision today to treat with OMT was based on Physical Exam  After verbal  consent patient was treated with HVLA, ME, FPR techniques in cervical, rib, thoracic, lumbar, and sacral  areas  Patient tolerated the procedure well with improvement in symptoms  Patient given exercises, stretches and lifestyle modifications  See medications in patient instructions if given  Patient will follow up in 4-8 weeks      The above documentation has been reviewed and is accurate and complete Cheryl Pulley, DO       Note: This dictation was prepared with Dragon dictation along with smaller phrase technology. Any transcriptional errors that result from this process are unintentional.

## 2020-10-21 ENCOUNTER — Ambulatory Visit: Payer: 59 | Admitting: Family Medicine

## 2020-10-21 ENCOUNTER — Other Ambulatory Visit: Payer: Self-pay

## 2020-10-21 ENCOUNTER — Encounter: Payer: Self-pay | Admitting: Family Medicine

## 2020-10-21 ENCOUNTER — Telehealth (INDEPENDENT_AMBULATORY_CARE_PROVIDER_SITE_OTHER): Payer: 59 | Admitting: Family Medicine

## 2020-10-21 ENCOUNTER — Telehealth: Payer: Self-pay | Admitting: Family Medicine

## 2020-10-21 ENCOUNTER — Other Ambulatory Visit (HOSPITAL_COMMUNITY): Payer: Self-pay

## 2020-10-21 VITALS — BP 100/70 | HR 92 | Ht 66.0 in | Wt 202.0 lb

## 2020-10-21 DIAGNOSIS — M9902 Segmental and somatic dysfunction of thoracic region: Secondary | ICD-10-CM | POA: Diagnosis not present

## 2020-10-21 DIAGNOSIS — M533 Sacrococcygeal disorders, not elsewhere classified: Secondary | ICD-10-CM | POA: Diagnosis not present

## 2020-10-21 DIAGNOSIS — F411 Generalized anxiety disorder: Secondary | ICD-10-CM

## 2020-10-21 DIAGNOSIS — M9904 Segmental and somatic dysfunction of sacral region: Secondary | ICD-10-CM

## 2020-10-21 DIAGNOSIS — F331 Major depressive disorder, recurrent, moderate: Secondary | ICD-10-CM

## 2020-10-21 DIAGNOSIS — M9908 Segmental and somatic dysfunction of rib cage: Secondary | ICD-10-CM

## 2020-10-21 DIAGNOSIS — M9903 Segmental and somatic dysfunction of lumbar region: Secondary | ICD-10-CM | POA: Diagnosis not present

## 2020-10-21 DIAGNOSIS — M9901 Segmental and somatic dysfunction of cervical region: Secondary | ICD-10-CM

## 2020-10-21 MED ORDER — BUPROPION HCL ER (XL) 150 MG PO TB24
150.0000 mg | ORAL_TABLET | Freq: Every day | ORAL | 3 refills | Status: DC
  Filled 2020-10-21: qty 30, 30d supply, fill #0
  Filled 2020-12-02: qty 30, 30d supply, fill #1
  Filled 2021-01-20: qty 30, 30d supply, fill #2
  Filled 2021-03-13: qty 30, 30d supply, fill #3

## 2020-10-21 MED ORDER — MELOXICAM 15 MG PO TABS
15.0000 mg | ORAL_TABLET | Freq: Every day | ORAL | 0 refills | Status: DC
  Filled 2020-10-21: qty 30, 30d supply, fill #0

## 2020-10-21 NOTE — Telephone Encounter (Signed)
Called and spoke w/ pt and mother at length regarding medications, risks, side effects

## 2020-10-21 NOTE — Telephone Encounter (Signed)
Please see if you can get more information from patient so I am prepared to answer her question

## 2020-10-21 NOTE — Assessment & Plan Note (Signed)
Chronic problem with exacerbation.  Patient has had difficulty focusing on herself at this time.  Patient continues to not be able to work out as regularly.  Still mourning the death of her father.  Having some difficulty with school.  Patient though is optimistic that things will get better.  Given her some meloxicam to help her with some more of the pain control.  Increased dose from 7.5 to 15 mg.  Encouraged her to use it in 3 to 5-day burst.  Patient will start doing the exercises more regularly again and follow-up with me again 6 to 8 weeks.

## 2020-10-21 NOTE — Patient Instructions (Signed)
Meloxicam 15mg  daily  Use zanaflex in 3 night bursts when needed Work on posture while at computer See me in 5-6 weeks

## 2020-10-21 NOTE — Telephone Encounter (Signed)
Called patient and she wanted to know what the risk were to the Wellbuturin and was concerned that adding an additional med would affect her sleep.

## 2020-10-21 NOTE — Telephone Encounter (Signed)
Patient states that she has another question from this mornings appointment - please call back

## 2020-10-21 NOTE — Progress Notes (Signed)
I connected with  Cheryl [name] on 10/21/20 by a video enabled telemedicine application and verified that I am speaking with the correct person using two identifiers.   I discussed the limitations of evaluation and management by telemedicine. The patient expressed understanding and agreed to proceed.

## 2020-10-21 NOTE — Progress Notes (Signed)
Virtual Visit via Video   I connected with patient on 10/21/20 at 10:30 AM EDT by a video enabled telemedicine application and verified that I am speaking with the correct person using two identifiers.  Location patient: Home Location provider: [facility] Persons participating in the virtual visit: Patient, Provider, [place] (Sabrina M)  I discussed the limitations of evaluation and management by telemedicine and the availability of in person appointments. The patient expressed understanding and agreed to proceed.  Subjective:   HPI:   Anxiety/Depression- at last visit Prozac was increased to 40mg  daily.  Today scored 21 on PHQ.  Feels things are 'about the same'.  Her anxiety and panic 'are about the same, just about different things'.  Her dad's estate isn't settled and she is having to go through his things and managing the property.  Pt has just moved into her own place.  Pt continues to feel tearful and overwhelmed 'more than 1/2 the days'.  Continues to have low energy, low motivation.  Pt thinks she has tried Wellbutrin in the past but doesn't remember it.  Currently in counseling once a week- 'it's ok'.  Pt isn't sure that the Trazodone is as effective.  ROS:   See pertinent positives and negatives per HPI.  Patient Active Problem List   Diagnosis Date Noted  . ADHD (attention deficit hyperactivity disorder), 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  . Neck muscle spasm 12/03/2019  . Patellar subluxation, left, initial encounter 11/14/2019  . SI (sacroiliac) joint dysfunction 07/14/2019  . Nonallopathic lesion of sacral region 07/14/2019  . Nonallopathic lesion of lumbosacral region 07/14/2019  . Nonallopathic lesion of thoracic region 07/14/2019  . Medial tibial stress syndrome, right, initial encounter 07/14/2019  . Palpitations 04/13/2019  . PCOS (polycystic ovarian syndrome) 11/16/2016  . Physical exam 11/15/2016  . Amenorrhea 11/15/2016  . Weight gain 11/15/2016   . GAD (generalized anxiety disorder) 03/22/2015  . MDD (major depressive disorder), recurrent episode, moderate ([place]) 03/17/2015    Social History   Tobacco Use  . Smoking status: Former Smoker    Quit date: 10/21/2017    Years since quitting: 3.0  . Smokeless tobacco: Never Used  Substance Use Topics  . Alcohol use: No    Alcohol/week: 0.0 standard drinks    Current Outpatient Medications:  .  albuterol (VENTOLIN HFA) 108 (90 Base) MCG/ACT inhaler, INHALE 2 PUFFS INTO THE LUNGS EVERY 6 (SIX) HOURS AS NEEDED FOR WHEEZING OR SHORTNESS OF BREATH., Disp: 18 g, Rfl: 0 .  ALPRAZolam (XANAX) 0.5 MG tablet, TAKE 1 TABLET BY MOUTH 2 TIMES DAILY AS NEEDED FOR ANXIETY., Disp: 30 tablet, Rfl: 1 .  benzonatate (TESSALON) 100 MG capsule, Take 1 capsule (100 mg total) by mouth 2 (two) times daily as needed for cough., Disp: 20 capsule, Rfl: 0 .  cycloSPORINE (RESTASIS) 0.05 % ophthalmic emulsion, Instill 1 drop into both eyes twice a day, Disp: 180 each, Rfl: 3 .  EPINEPHrine 0.3 mg/0.3 mL IJ SOAJ injection, , Disp: , Rfl: 1 .  fexofenadine-pseudoephedrine (ALLEGRA-D ALLERGY & CONGESTION) 180-240 MG 24 hr tablet, Take 1 tablet by mouth daily., Disp: 20 tablet, Rfl: 0 .  FLUoxetine (PROZAC) 40 MG capsule, TAKE 1 CAPSULE BY MOUTH ONCE A DAY, Disp: 90 capsule, Rfl: 1 .  fluticasone (FLONASE) 50 MCG/ACT nasal spray, INSTILL 1 TO 2 SPRAYS INTO BOTH NOSTRILS DAILY, Disp: 16 g, Rfl: 0 .  loratadine-pseudoephedrine (CLARITIN-D 24 HOUR) 10-240 MG 24 hr tablet, Take 1 tablet by mouth daily., Disp: 30 tablet,  Rfl: 0 .  meloxicam (MOBIC) 15 MG tablet, Take 1 tablet (15 mg total) by mouth daily., Disp: 30 tablet, Rfl: 0 .  mupirocin ointment (BACTROBAN) 2 %, APPLY TO SKIN TWICE A DAY, Disp: 22 g, Rfl: 1 .  neomycin-polymyxin-hydrocortisone (CORTISPORIN) OTIC solution, INSTILL 3 TO 4 DROPS INTO AFFECTED EAR THREE TIMES DAILY FOR 7 DAYS THEN USE DAILY FOR 1 WEEK, Disp: 10 mL, Rfl: 1 .  ondansetron (ZOFRAN) 4 MG  tablet, Take 1 tablet (4 mg total) by mouth every 8 (eight) hours as needed for nausea or vomiting., Disp: 20 tablet, Rfl: 1 .  tiZANidine (ZANAFLEX) 2 MG tablet, TAKE 1 TABLET BY MOUTH AT BEDTIME, Disp: 15 tablet, Rfl: 0 .  traZODone (DESYREL) 50 MG tablet, TAKE 1/2 TO 1 TABLET BY MOUTH AT BEDTIME AS NEEDED FOR SLEEP., Disp: 30 tablet, Rfl: 3 .  cephALEXin (KEFLEX) 500 MG capsule, TAKE 1 CAPSULE BY MOUTH TWICE A DAY (Patient not taking: Reported on 10/21/2020), Disp: 20 capsule, Rfl: 0 .  CONCERTA 18 MG CR tablet, TAKE 1 TABLET BY MOUTH ONCE A DAY (Patient not taking: Reported on 10/21/2020), Disp: 30 tablet, Rfl: 0 .  FLUoxetine (PROZAC) 40 MG capsule, TAKE 1 CAPSULE BY MOUTH ONCE DAILY IN THE MORNING (Patient not taking: Reported on 10/21/2020), Disp: 90 capsule, Rfl: 0 .  meloxicam (MOBIC) 7.5 MG tablet, TAKE 1 TABLET BY MOUTH DAILY. (Patient not taking: Reported on 10/21/2020), Disp: 30 tablet, Rfl: 0 .  NUVARING 0.12-0.015 MG/24HR vaginal ring, , Disp: , Rfl:  .  ondansetron (ZOFRAN-ODT) 4 MG disintegrating tablet, DISSOLVE 1 TABLET BY MOUTH EVERY 8 HOURS AS NEEDED FOR NAUSEA & VOMITING (Patient not taking: Reported on 10/21/2020), Disp: 20 tablet, Rfl: 0 .  tiZANidine (ZANAFLEX) 2 MG tablet, TAKE 1 TABLET BY MOUTH AT BEDTIME (Patient not taking: Reported on 10/21/2020), Disp: 30 tablet, Rfl: 0  No Known Allergies  Objective:   BP 100/70   Pulse 92   Ht 5\' 6"  (1.676 m)   Wt 202 lb (91.6 kg)   SpO2 97%   BMI 32.60 kg/m  AAOx3, NAD NCAT, EOMI No obvious CN deficits Coloring WNL Pt is able to speak clearly, coherently without shortness of breath or increased work of breathing.  Thought process is linear.  Mood is appropriate.   Assessment and Plan:   Anxiety/Depression- ongoing.  Had visit with patient via video and we decided that rather than increasing or changing sleep meds that our goal should be to improve and treat her underlying anxiety and depression- which is likely causing her sleep  disturbance.  Since she is having issues w/ low energy, low motivation it seems like the best option is to add Wellbutrin rather than continue to increase the Prozac which she doesn't feel is improving things.  Pt was in agreement with plan and then after video visit was closed, pt's mom wanted more information so I got back on and spent another 17 minutes discussing the medications, possible side effects (seizure risk of 0.1% at doses greater than 450mg ), rationale behind the treatment plan, and possible next steps.   Annye Asa, MD 10/21/2020

## 2020-11-16 ENCOUNTER — Encounter: Payer: 59 | Admitting: Family Medicine

## 2020-11-30 ENCOUNTER — Ambulatory Visit (INDEPENDENT_AMBULATORY_CARE_PROVIDER_SITE_OTHER): Payer: 59 | Admitting: Family Medicine

## 2020-11-30 ENCOUNTER — Other Ambulatory Visit: Payer: Self-pay

## 2020-11-30 ENCOUNTER — Encounter: Payer: Self-pay | Admitting: Family Medicine

## 2020-11-30 ENCOUNTER — Other Ambulatory Visit (HOSPITAL_COMMUNITY): Payer: Self-pay

## 2020-11-30 VITALS — BP 112/80 | HR 77 | Temp 98.6°F | Resp 18 | Ht 66.0 in | Wt 200.4 lb

## 2020-11-30 DIAGNOSIS — E282 Polycystic ovarian syndrome: Secondary | ICD-10-CM | POA: Diagnosis not present

## 2020-11-30 DIAGNOSIS — E669 Obesity, unspecified: Secondary | ICD-10-CM | POA: Diagnosis not present

## 2020-11-30 DIAGNOSIS — F331 Major depressive disorder, recurrent, moderate: Secondary | ICD-10-CM | POA: Diagnosis not present

## 2020-11-30 DIAGNOSIS — Z0000 Encounter for general adult medical examination without abnormal findings: Secondary | ICD-10-CM | POA: Diagnosis not present

## 2020-11-30 DIAGNOSIS — F411 Generalized anxiety disorder: Secondary | ICD-10-CM | POA: Diagnosis not present

## 2020-11-30 MED ORDER — TRAZODONE HCL 50 MG PO TABS
ORAL_TABLET | Freq: Every evening | ORAL | 3 refills | Status: DC | PRN
  Filled 2020-11-30: qty 30, 30d supply, fill #0
  Filled 2021-01-20: qty 30, 30d supply, fill #1
  Filled 2021-03-02 – 2021-03-13 (×2): qty 30, 30d supply, fill #2
  Filled 2021-04-21: qty 30, 30d supply, fill #3

## 2020-11-30 MED ORDER — ALPRAZOLAM 0.5 MG PO TABS
ORAL_TABLET | Freq: Two times a day (BID) | ORAL | 1 refills | Status: DC | PRN
  Filled 2020-11-30: qty 30, 15d supply, fill #0
  Filled 2021-02-21: qty 30, 15d supply, fill #1

## 2020-11-30 NOTE — Assessment & Plan Note (Signed)
Pt's PE WNL w/ exception of obesity.  Will get labs to risk stratify- although pt doesn't want to do them today.  UTD on immunizations, sees GYN.  Anticipatory guidance provided.

## 2020-11-30 NOTE — Patient Instructions (Signed)
Schedule a lab appt at your convenience Follow up with me in 6-8 weeks to recheck mood and sleep We'll notify you of your lab results and make any changes if needed Continue to work on healthy diet and regular exercise- you can do it! Restart the Trazodone- 1/2 tab nightly.  Increase to 1 tab if needed Call with any questions or concerns Have a great trip!!!

## 2020-11-30 NOTE — Progress Notes (Signed)
   Subjective:    Patient ID: Cheryl [name], female    DOB: 06/11/2001, 20 y.o.   MRN: [id number]  HPI CPE- UTD on Tdap, COVID.  Due to start pap smears (Dr Corinna Capra).  Currently lives in an apartment w/ her dog.  Working at [employer] past medical, surgical, family and social histories.    Review of Systems Patient reports no vision/ hearing changes, adenopathy,fever, weight change,  persistant/recurrent hoarseness , swallowing issues, chest pain, palpitations, edema, persistant/recurrent cough, hemoptysis, dyspnea (rest/exertional/paroxysmal nocturnal), gastrointestinal bleeding (melena, rectal bleeding), abdominal pain, significant heartburn, bowel changes, GU symptoms (dysuria, hematuria, incontinence), Gyn symptoms (abnormal  bleeding, pain),  syncope, focal weakness, memory loss, numbness & tingling, skin/hair/nail changes, abnormal bruising or bleeding.  + anxiety- ongoing issue for pt.  On Wellbutrin 150mg  daily, Prozac 40mg  daily, and Alprazolam as needed. Pt reports 'more panic attacks in the last 3 weeks'.  Pt reports not sleeping well which is impacting her in other areas.  Having nightmares.  Continues with counseling.  This visit occurred during the SARS-CoV-2 public health emergency.  Safety protocols were in place, including screening questions prior to the visit, additional usage of staff PPE, and extensive cleaning of exam room while observing appropriate contact time as indicated for disinfecting solutions.      Objective:   Physical Exam General Appearance:    Alert, cooperative, no distress, appears stated age, obese  Head:    Normocephalic, without obvious abnormality, atraumatic  Eyes:    PERRL, conjunctiva/corneas clear, EOM's intact, fundi    benign, both eyes  Ears:    Normal TM's and external ear canals, both ears  Nose:   Deferred due to COVID  Throat:   Neck:   Supple, symmetrical, trachea midline, no adenopathy;    Thyroid: no  enlargement/tenderness/nodules  Back:     Symmetric, no curvature, ROM normal, no CVA tenderness  Lungs:     Clear to auscultation bilaterally, respirations unlabored  Chest Wall:    No tenderness or deformity   Heart:    Regular rate and rhythm, S1 and S2 normal, no murmur, rub   or gallop  Breast Exam:    Deferred to GYN  Abdomen:     Soft, non-tender, bowel sounds active all four quadrants,    no masses, no organomegaly  Genitalia:    Deferred to GYN  Rectal:    Extremities:   Extremities normal, atraumatic, no cyanosis or edema  Pulses:   2+ and symmetric all extremities  Skin:   Skin color, texture, turgor normal, no rashes or lesions  Lymph nodes:   Cervical, supraclavicular, and axillary nodes normal  Neurologic:   CNII-XII intact, normal strength, sensation and reflexes    throughout          Assessment & Plan:

## 2020-11-30 NOTE — Assessment & Plan Note (Signed)
Improving w/ addition of Wellbutrin.  Continue Wellbutrin and Prozac at current doses.

## 2020-11-30 NOTE — Assessment & Plan Note (Signed)
Ongoing issue for pt.  Currently worse due to lack of sleep.  Will restart Trazodone at 25mg  nightly and increase to 50mg  nightly if needed.  Pt expressed understanding and is in agreement w/ plan.

## 2020-11-30 NOTE — Assessment & Plan Note (Signed)
New.  Pt's BMI is 32.35.  Encouraged healthy diet and regular exercise.  Will check labs to risk stratify.

## 2020-12-02 ENCOUNTER — Other Ambulatory Visit (HOSPITAL_COMMUNITY): Payer: Self-pay

## 2020-12-02 MED ORDER — AZITHROMYCIN 500 MG PO TABS
ORAL_TABLET | ORAL | 0 refills | Status: DC
  Filled 2020-12-02: qty 4, 3d supply, fill #0

## 2020-12-08 ENCOUNTER — Other Ambulatory Visit (INDEPENDENT_AMBULATORY_CARE_PROVIDER_SITE_OTHER): Payer: 59

## 2020-12-08 ENCOUNTER — Other Ambulatory Visit: Payer: Self-pay

## 2020-12-08 DIAGNOSIS — E669 Obesity, unspecified: Secondary | ICD-10-CM | POA: Diagnosis not present

## 2020-12-08 DIAGNOSIS — E282 Polycystic ovarian syndrome: Secondary | ICD-10-CM | POA: Diagnosis not present

## 2020-12-08 LAB — HEPATIC FUNCTION PANEL
ALT: 16 U/L (ref 0–35)
AST: 16 U/L (ref 0–37)
Albumin: 4.3 g/dL (ref 3.5–5.2)
Alkaline Phosphatase: 67 U/L (ref 39–117)
Bilirubin, Direct: 0.1 mg/dL (ref 0.0–0.3)
Total Bilirubin: 0.3 mg/dL (ref 0.2–1.2)
Total Protein: 7.6 g/dL (ref 6.0–8.3)

## 2020-12-08 LAB — CBC WITH DIFFERENTIAL/PLATELET
Basophils Absolute: 0 10*3/uL (ref 0.0–0.1)
Basophils Relative: 0.7 % (ref 0.0–3.0)
Eosinophils Absolute: 0.1 10*3/uL (ref 0.0–0.7)
Eosinophils Relative: 1.1 % (ref 0.0–5.0)
HCT: 43.3 % (ref 36.0–46.0)
Hemoglobin: 14.4 g/dL (ref 12.0–15.0)
Lymphocytes Relative: 23.3 % (ref 12.0–46.0)
Lymphs Abs: 1.6 10*3/uL (ref 0.7–4.0)
MCHC: 33.4 g/dL (ref 30.0–36.0)
MCV: 83.2 fl (ref 78.0–100.0)
Monocytes Absolute: 0.4 10*3/uL (ref 0.1–1.0)
Monocytes Relative: 6.5 % (ref 3.0–12.0)
Neutro Abs: 4.6 10*3/uL (ref 1.4–7.7)
Neutrophils Relative %: 68.4 % (ref 43.0–77.0)
Platelets: 258 10*3/uL (ref 150.0–400.0)
RBC: 5.2 Mil/uL — ABNORMAL HIGH (ref 3.87–5.11)
RDW: 14.2 % (ref 11.5–15.5)
WBC: 6.7 10*3/uL (ref 4.0–10.5)

## 2020-12-08 LAB — BASIC METABOLIC PANEL
BUN: 14 mg/dL (ref 6–23)
CO2: 28 mEq/L (ref 19–32)
Calcium: 9.2 mg/dL (ref 8.4–10.5)
Chloride: 103 mEq/L (ref 96–112)
Creatinine, Ser: 0.78 mg/dL (ref 0.40–1.20)
GFR: 108.25 mL/min (ref >60.00)
Glucose, Bld: 86 mg/dL (ref 70–99)
Potassium: 4.3 mEq/L (ref 3.5–5.1)
Sodium: 139 mEq/L (ref 135–145)

## 2020-12-08 LAB — LIPID PANEL
Cholesterol: 172 mg/dL (ref 0–200)
HDL: 46.6 mg/dL (ref >39.00)
LDL Cholesterol: 109 mg/dL — ABNORMAL HIGH (ref 0–99)
NonHDL: 125.58
Total CHOL/HDL Ratio: 4
Triglycerides: 84 mg/dL (ref 0.0–149.0)
VLDL: 16.8 mg/dL (ref 0.0–40.0)

## 2020-12-08 LAB — TSH: TSH: 2.13 u[IU]/mL (ref 0.35–4.50)

## 2020-12-08 LAB — TESTOSTERONE: Testosterone: 72.47 ng/dL — ABNORMAL HIGH (ref 15.00–40.00)

## 2020-12-08 NOTE — Progress Notes (Signed)
[place] Phone: [phone] Subjective:    Cheryl [name], am serving as a scribe for Dr. Hulan Saas. This visit occurred during the SARS-CoV-2 public health emergency.  Safety protocols were in place, including screening questions prior to the visit, additional usage of staff PPE, and extensive cleaning of exam room while observing appropriate contact time as indicated for disinfecting solutions.   I'm seeing this patient by the request  of:  Midge Minium, MD  CC: Back pain follow-up  [id number]  Cheryl [name] is a 20 y.o. female coming in with complaint of back and neck pain. OMT 5/62022. Patient states that she is using meloxicam daily and this is helping her pain.  Patient states that she is making some improvement which has been sometime.  Not having as much tightness of the low back or the neck  Medications patient has been prescribed: Zanaflex, Meloxicam  Taking: Yes         Reviewed prior external information including notes and imaging from previsou exam, outside providers and external EMR if available.   As well as notes that were available from care everywhere and other healthcare systems.  Past medical history, social, surgical and family history all reviewed in electronic medical record.  [name] pertanent information unless stated regarding to the chief complaint.   Past Medical History:  Diagnosis Date   Allergy    Anxiety    Depression    PCOS (polycystic ovarian syndrome) 11/2016    [name] Known Allergies   Review of Systems:  [name] headache, visual changes, nausea, vomiting, diarrhea, constipation, dizziness, abdominal pain, skin rash, fevers, chills, night sweats, weight loss, swollen lymph nodes, body aches, joint swelling, chest pain, shortness of breath, mood changes. POSITIVE muscle aches  Objective  Blood pressure 110/70, pulse (!) 115, height 5\' 6"  (1.676 m), weight  202 lb (91.6 kg), SpO2 96 %.   General: [name], dressed appropriately.  HEENT: Pupils equal, extraocular movements intact  Respiratory: Patient's speak in full sentences and does not appear short of breath  Cardiovascular: [name], non tender, [name]  Low back exam still has some tightness around the sacroiliac joint.  [name] symptoms right greater than left.  Positive FABER test.  Negative straight leg test.  Patient's neck still has some mild loss of lordosis.  Negative Spurling's.  Osteopathic findings  C2 flexed rotated and side bent right C7 flexed rotated and side bent left T3 extended rotated and side bent right inhaled rib T8 extended rotated and side bent left L2 flexed rotated and side bent right Sacrum right on right       Assessment and Plan:  SI (sacroiliac) joint dysfunction Chronic problem and stable.  Responding fairly well though to osteopathic manipulation.  Discussed icing regimen and home exercises.  Continue the meloxicam at this time.  Does have Zanaflex for breakthrough.  Follow-up again in 6 to 8 weeks   Nonallopathic problems  Decision today to treat with OMT was based on Physical Exam  After verbal consent patient was treated with HVLA, ME, FPR techniques in cervical, rib, thoracic, lumbar, and sacral  areas  Patient tolerated the procedure well with improvement in symptoms  Patient given exercises, stretches and lifestyle modifications  See medications in patient instructions if given  Patient will follow up in 4-8 weeks      The above documentation  has been reviewed and is accurate and complete Cheryl Pulley, DO        Note: This dictation was prepared with Dragon dictation along with smaller phrase technology. Any transcriptional errors that result from this process are unintentional.

## 2020-12-09 ENCOUNTER — Other Ambulatory Visit: Payer: Self-pay

## 2020-12-09 ENCOUNTER — Ambulatory Visit: Payer: 59 | Admitting: Family Medicine

## 2020-12-09 ENCOUNTER — Encounter: Payer: Self-pay | Admitting: Family Medicine

## 2020-12-09 VITALS — BP 110/70 | HR 115 | Ht 66.0 in | Wt 202.0 lb

## 2020-12-09 DIAGNOSIS — M9908 Segmental and somatic dysfunction of rib cage: Secondary | ICD-10-CM | POA: Diagnosis not present

## 2020-12-09 DIAGNOSIS — M9904 Segmental and somatic dysfunction of sacral region: Secondary | ICD-10-CM

## 2020-12-09 DIAGNOSIS — M9901 Segmental and somatic dysfunction of cervical region: Secondary | ICD-10-CM

## 2020-12-09 DIAGNOSIS — M9902 Segmental and somatic dysfunction of thoracic region: Secondary | ICD-10-CM | POA: Diagnosis not present

## 2020-12-09 DIAGNOSIS — M533 Sacrococcygeal disorders, not elsewhere classified: Secondary | ICD-10-CM | POA: Diagnosis not present

## 2020-12-09 DIAGNOSIS — R7989 Other specified abnormal findings of blood chemistry: Secondary | ICD-10-CM

## 2020-12-09 DIAGNOSIS — M9903 Segmental and somatic dysfunction of lumbar region: Secondary | ICD-10-CM | POA: Diagnosis not present

## 2020-12-09 NOTE — Assessment & Plan Note (Signed)
Chronic problem and stable.  Responding fairly well though to osteopathic manipulation.  Discussed icing regimen and home exercises.  Continue the meloxicam at this time.  Does have Zanaflex for breakthrough.  Follow-up again in 6 to 8 weeks

## 2020-12-09 NOTE — Patient Instructions (Signed)
Enjoy the beach Work sleep Continue medications See me again in 6 weeks

## 2020-12-14 ENCOUNTER — Encounter: Payer: Self-pay | Admitting: *Deleted

## 2020-12-15 LAB — DHEA: DHEA: 198 ng/dL

## 2020-12-23 ENCOUNTER — Other Ambulatory Visit (HOSPITAL_COMMUNITY): Payer: Self-pay

## 2020-12-23 ENCOUNTER — Other Ambulatory Visit: Payer: Self-pay | Admitting: Family Medicine

## 2020-12-23 MED ORDER — TIZANIDINE HCL 2 MG PO TABS
ORAL_TABLET | Freq: Every day | ORAL | 0 refills | Status: DC
  Filled 2020-12-23: qty 15, 15d supply, fill #0

## 2021-01-10 DIAGNOSIS — M24462 Recurrent dislocation, left knee: Secondary | ICD-10-CM | POA: Diagnosis not present

## 2021-01-11 ENCOUNTER — Encounter: Payer: 59 | Admitting: Family Medicine

## 2021-01-18 ENCOUNTER — Ambulatory Visit: Payer: 59 | Admitting: Family Medicine

## 2021-01-19 ENCOUNTER — Ambulatory Visit: Payer: Self-pay

## 2021-01-19 ENCOUNTER — Encounter: Payer: Self-pay | Admitting: Family Medicine

## 2021-01-19 ENCOUNTER — Ambulatory Visit: Payer: 59 | Admitting: Family Medicine

## 2021-01-19 ENCOUNTER — Other Ambulatory Visit: Payer: Self-pay

## 2021-01-19 VITALS — BP 102/72 | HR 86 | Ht 66.0 in | Wt 202.0 lb

## 2021-01-19 DIAGNOSIS — M533 Sacrococcygeal disorders, not elsewhere classified: Secondary | ICD-10-CM

## 2021-01-19 DIAGNOSIS — M9902 Segmental and somatic dysfunction of thoracic region: Secondary | ICD-10-CM

## 2021-01-19 DIAGNOSIS — M9904 Segmental and somatic dysfunction of sacral region: Secondary | ICD-10-CM | POA: Diagnosis not present

## 2021-01-19 DIAGNOSIS — S83002A Unspecified subluxation of left patella, initial encounter: Secondary | ICD-10-CM | POA: Diagnosis not present

## 2021-01-19 DIAGNOSIS — M9901 Segmental and somatic dysfunction of cervical region: Secondary | ICD-10-CM | POA: Diagnosis not present

## 2021-01-19 DIAGNOSIS — G8929 Other chronic pain: Secondary | ICD-10-CM

## 2021-01-19 DIAGNOSIS — M9908 Segmental and somatic dysfunction of rib cage: Secondary | ICD-10-CM

## 2021-01-19 DIAGNOSIS — M9903 Segmental and somatic dysfunction of lumbar region: Secondary | ICD-10-CM

## 2021-01-19 DIAGNOSIS — M25562 Pain in left knee: Secondary | ICD-10-CM

## 2021-01-19 NOTE — Assessment & Plan Note (Signed)
Patient given a brace.  Has had this happen again 1 year ago.  We will continue to work on VMO stability  Would like to consider the possibility of getting the MRI sooner.  Depending on how soon we can have this done then we will discuss starting formal physical therapy.  Increase activity follow-up again in 4 to 8 weeks.

## 2021-01-19 NOTE — Patient Instructions (Signed)
Good to see you  MRI ordered to [facility] as you can get in sooner their number is [phone] Brace given Tru pull lite Left side  Continue to ice knee Can walk without the crutches with brace See me again in 4-6 weeks but will be in touch sooner with the MRI results

## 2021-01-19 NOTE — Assessment & Plan Note (Signed)
Mild increase in discomfort secondary to patient pain in the sacroiliac area.  Patient likely though is having pain from the knee using a crutch at the moment.  As soon we can the patient be more active the more likely she will not have the discomfort.  Follow-up again after imaging and continue physical therapy and osteopathic manipulation

## 2021-01-19 NOTE — Progress Notes (Signed)
[place] Phone: [phone] Subjective:   Cheryl [name], am serving as a scribe for Dr. Hulan Saas.  This visit occurred during the SARS-CoV-2 public health emergency.  Safety protocols were in place, including screening questions prior to the visit, additional usage of staff PPE, and extensive cleaning of exam room while observing appropriate contact time as indicated for disinfecting solutions.   I'm seeing this patient by the request  of:  Midge Minium, MD  CC: Back pain and knee pain follow-up  [id number]  Cheryl [name] is a 20 y.o. female coming in with complaint of back and neck pain. OMT 12/09/2020. Patient states that she dislocated L knee 3 weeks ago. Continued pain over medial aspect. Patient was in [place] and feels that her back is tight from traveling and using a crutch.  Patient is having another dislocation previously.  Was seen another provider and is scheduled to have an MRI in the near future.  Patient feels like she is better than what she has had previously but continues to have difficulty.  Medications patient has been prescribed: Zanaflex  Taking: Yes          Past Medical History:  Diagnosis Date   Allergy    Anxiety    Depression    PCOS (polycystic ovarian syndrome) 11/2016    [name] Known Allergies   Review of Systems:  [name] headache, visual changes, nausea, vomiting, diarrhea, constipation, dizziness, abdominal pain, skin rash, fevers, chills, night sweats, weight loss, swollen lymph nodes, body aches, joint swelling, chest pain, shortness of breath, mood changes. POSITIVE muscle aches  Objective  Blood pressure 102/72, pulse 86, height '5\' 6"'$  (1.676 m), weight 202 lb (91.6 kg), SpO2 98 %.   General: [name], dressed appropriately.  HEENT: Pupils equal, extraocular movements intact  Respiratory: Patient's  speak in full sentences and does not appear short of breath  Antalgic gait.  Using a crutch. Left knee does have some mild swelling noted.  Limited range of motion.  Patient's patella seems to be in place.  Does have some pain noted with lateral translation.  Patient does have involuntary guarding noted.  Limited muscular skeletal ultrasound was performed and interpreted by Hulan Saas, M  Patient does have some mild hypoechoic changes of the patellofemoral joint but very minimal.  [name] cortical irregularities noted.  Does have some mild narrowing of the patellofemoral joint space laterally.  [name] significant findings of the medial condyle that can be seen on ultrasound today. Impression: [name] cortical irregularity after subluxation of the knee.  Patient does still have difficulty with ambulation   Osteopathic findings  C6 flexed rotated and side bent left T3 extended rotated and side bent right inhaled rib T11 extended rotated and side bent left L2 flexed rotated and side bent right Sacrum right on right       Assessment and Plan:  Patellar subluxation, left, initial encounter Patient given a brace.  Has had this happen again 1 year ago.  We will continue to work on VMO stability  Would like to consider the possibility of getting the MRI sooner.  Depending on how soon we can have this done then we will discuss starting formal physical therapy.  Increase activity follow-up again in 4 to 8 weeks.  SI (sacroiliac) joint dysfunction Mild increase in discomfort secondary to patient pain in the sacroiliac area.  Patient  likely though is having pain from the knee using a crutch at the moment.  As soon we can the patient be more active the more likely she will not have the discomfort.  Follow-up again after imaging and continue physical therapy and osteopathic manipulation   Nonallopathic problems  Decision today to treat with OMT was based on Physical Exam  After verbal consent patient was  treated with HVLA, ME, FPR techniques in cervical, rib, thoracic, lumbar, and sacral  areas  Patient tolerated the procedure well with improvement in symptoms  Patient given exercises, stretches and lifestyle modifications  See medications in patient instructions if given  Patient will follow up in 4-8 weeks      The above documentation has been reviewed and is accurate and complete [name], DO       Note: This dictation was prepared with Dragon dictation along with smaller phrase technology. Any transcriptional errors that result from this process are unintentional.

## 2021-01-20 ENCOUNTER — Other Ambulatory Visit (HOSPITAL_COMMUNITY): Payer: Self-pay

## 2021-01-21 ENCOUNTER — Ambulatory Visit (INDEPENDENT_AMBULATORY_CARE_PROVIDER_SITE_OTHER): Payer: 59

## 2021-01-21 ENCOUNTER — Other Ambulatory Visit: Payer: Self-pay

## 2021-01-21 DIAGNOSIS — S83002A Unspecified subluxation of left patella, initial encounter: Secondary | ICD-10-CM

## 2021-01-21 DIAGNOSIS — M25562 Pain in left knee: Secondary | ICD-10-CM | POA: Diagnosis not present

## 2021-01-23 ENCOUNTER — Other Ambulatory Visit: Payer: Self-pay

## 2021-01-23 ENCOUNTER — Encounter: Payer: Self-pay | Admitting: Physical Therapy

## 2021-01-23 ENCOUNTER — Ambulatory Visit: Payer: 59 | Admitting: Physical Therapy

## 2021-01-23 ENCOUNTER — Encounter: Payer: Self-pay | Admitting: Internal Medicine

## 2021-01-23 DIAGNOSIS — M6281 Muscle weakness (generalized): Secondary | ICD-10-CM

## 2021-01-23 DIAGNOSIS — R2689 Other abnormalities of gait and mobility: Secondary | ICD-10-CM

## 2021-01-23 DIAGNOSIS — M25562 Pain in left knee: Secondary | ICD-10-CM

## 2021-01-23 DIAGNOSIS — G8929 Other chronic pain: Secondary | ICD-10-CM | POA: Diagnosis not present

## 2021-01-23 NOTE — Patient Instructions (Signed)
Access Code: [email or web address] URL: [email or web address] Date: 01/23/2021 Prepared by: Lyndee Hensen  Exercises Supine Heel Slide - 2 x daily - 2 sets - 10 reps Supine Quadricep Sets - 2-3 x daily - 2 sets - 10 reps Seated Knee Extension AROM - 2 x daily - 1 sets - 10 reps Sidelying Hip Abduction - 1 x daily - 2 sets - 10 reps Standing Weight Shift - 2 x daily - 2 sets - 10 reps Stride Stance Weight Shift - 2 x daily - 2 sets - 10 reps

## 2021-01-25 ENCOUNTER — Encounter: Payer: Self-pay | Admitting: Physical Therapy

## 2021-01-25 ENCOUNTER — Encounter: Payer: 59 | Admitting: Physical Therapy

## 2021-01-25 NOTE — Therapy (Signed)
[facility] [address] [place], [place], [address] Phone: [phone]   Fax:  [phone]  Physical Therapy Evaluation  Patient Details  Name: Cheryl [name] MRN: [id number] Date of Birth: [birth year] Referring Provider (PT): Charlann Boxer   Encounter Date: 01/23/2021   PT End of Session - 01/25/21 1004     Visit Number 1    Number of Visits 16    Date for PT Re-Evaluation 03/20/21    Authorization Type [facility]    PT Start Time 1020    PT Stop Time 1104    PT Time Calculation (min) 44 min    Activity Tolerance Patient tolerated treatment well    Behavior During Therapy [facility] for tasks assessed/performed             Past Medical History:  Diagnosis Date   Allergy    Anxiety    Depression    PCOS (polycystic ovarian syndrome) 11/2016    Past Surgical History:  Procedure Laterality Date   WISDOM TOOTH EXTRACTION     WISDOM TOOTH EXTRACTION      There were no vitals filed for this visit.    Subjective Assessment - 01/25/21 1035     Subjective Pt had recent patella dislocation a few weeks ago. Was just turning around to sit down. She also has had previous episodes of this in 2013 and 2020. She does have recent MRI. 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 Pt with good motivation for strengthening and returning to prior level of activity.    Limitations Lifting;Standing;Walking;House hold activities    Patient Stated Goals decreased pain, improved ability for activity, strength, and exercise.    Currently in Pain? Yes    Pain Score 5     Pain Location Knee    Pain Orientation Left    Pain Descriptors / Indicators Aching    Pain Type Acute pain    Pain Onset More than a month ago    Pain Frequency Intermittent    Aggravating Factors  standing, walking, squat,    Pain Relieving Factors rest                [facility] PT Assessment - 01/25/21 0001        Assessment   Medical Diagnosis L kneepain    Referring Provider (PT) Charlann Boxer    Prior Therapy yes, after previous dislocations      Precautions   Precautions None      Balance Screen   Has the patient fallen in the past 6 months No      Prior Function   Level of Independence Independent      Cognition   Overall Cognitive Status Within Functional Limits for tasks assessed      ROM / Strength   AROM / PROM / Strength AROM;Strength;PROM      AROM   Overall AROM Comments R knee: Flexion WNL, extension: + 5    AROM Assessment Site Knee    Right/Left Knee Left    Left Knee Extension 0    Left Knee Flexion 107      PROM   PROM Assessment Site Knee    Right/Left Knee Left    Left Knee Extension -5    Left Knee Flexion 110      Strength   Overall Strength Comments L knee flex: 4/5, ext: 4-/5,  L Hip: 4-/5,  R hip: 4+/5      Palpation   Palpation comment Tenderness at medial patella, mild swelling around patella      Special Tests   Other special tests not tested , apprehension for patella mobility      Ambulation/Gait   Gait Comments Slow, cautious gait, increased knee flexion, and decreased heel strike.                        Objective measurements completed on examination: See above findings.       [facility] Adult PT Treatment/Exercise - 01/25/21 0001       Exercises   Exercises Knee/Hip      Knee/Hip Exercises: Standing   Other Standing Knee Exercises L/R weight shifts with focus on straight knee on L;      Knee/Hip Exercises: Seated   Long Arc Quad 15 reps    Long Arc Quad Limitations partial ROM if painful      Knee/Hip Exercises: Supine   Quad Sets 20 reps    Heel Slides 20 reps    Straight Leg Raises 10 reps;Both      Knee/Hip Exercises: Sidelying   Hip ABduction 10 reps    Hip ABduction Limitations slight knee flexion to decrease pain                    PT Education - 01/25/21 1004     Education Details PT POC, Exam  findings, HEP    Person(s) Educated Patient    Methods Explanation;Demonstration;Tactile cues;Verbal cues;Handout    Comprehension Verbalized understanding;Returned demonstration;Verbal cues required;Tactile cues required;Need further instruction              PT Short Term Goals - 01/25/21 1011       PT SHORT TERM GOAL #1   Title Pt to be independent with initial HEP    Time 2    Period Weeks    Status New    Target Date 02/06/21               PT Long Term Goals - 01/25/21 1012       PT LONG TERM GOAL #1   Title Pt to be independent with final HEP    Time 8    Period Weeks    Status New    Target Date 03/20/21      PT LONG TERM GOAL #2   Title Pt to demo full quad and hip strength bil , to improve stability, and ability for stairs, and dynamic activity.    Time 6    Period Weeks    Status New    Target Date 03/20/21      PT LONG TERM GOAL #3   Title Pt to demo [facility] of L knee to be WNL for pt age, with single leg and unstable surface, for safety with community and functional activity.    Time 8    Period Weeks    Status New    Target Date 03/20/21      PT LONG TERM GOAL #4   Title Pt to demo ability for squat, stairs, jogging, and direction changes without pain or apprehension in L knee.    Time 8    Period Weeks    Status New    Target Date 03/20/21      PT LONG TERM GOAL #5   Title Pt to report decreased pain in L knee to 0-2/10 with activity.  Time 8    Period Weeks    Status New    Target Date 03/20/21                    Plan - 01/25/21 1026     Clinical Impression Statement Pt presents with primary complaint of increased pain in L knee, following recent patella dislocation. This is pts 3rd episode of this since 2013. Pt with gait deficits, with slow, cautious gait, with decreased heel strike, and increased knee flexion. Pt with decreased strength of L quad and hip. She has been exercising, but not doing specific HEP for knee. Pt  currently unable to exercise.  Pt with significant decrease in functional activity, community activity, and exercise at this time, due to pain, weakness, and dysfunction. Pt to benefit from skilled PT to improve deficits and return to PLOF without pain.    Personal Factors and Comorbidities Past/Current Experience    Examination-Activity Limitations Stairs;Squat;Lift;Stand;Bend;Locomotion Level    Examination-Participation Restrictions Cleaning;Meal Prep;Yard Work;Community Activity;Laundry;Shop    Stability/Clinical Decision Making Stable/Uncomplicated    Clinical Decision Making Low    Rehab Potential Good    PT Frequency 2x / week    PT Duration 8 weeks    PT Treatment/Interventions ADLs/Self Care Home Management;Cryotherapy;Electrical Stimulation;Iontophoresis '4mg'$ /ml Dexamethasone;Moist Heat;Traction;Ultrasound;DME Instruction;Balance training;Therapeutic exercise;Therapeutic activities;Functional mobility training;Stair training;Gait training;Neuromuscular re-education;Patient/family education;Manual techniques;Vasopneumatic Device;Taping;Dry needling;Passive range of motion;Spinal Manipulations;Joint Manipulations    PT Home Exercise Plan [id number]    Consulted and Agree with Plan of Care Patient             Patient will benefit from skilled therapeutic intervention in order to improve the following deficits and impairments:  Abnormal gait, Pain, Decreased coordination, Decreased mobility, Increased muscle spasms, Decreased strength, Decreased range of motion, Decreased activity tolerance, Decreased balance, Difficulty walking  Visit Diagnosis: Chronic pain of left knee  Muscle weakness (generalized)  Other abnormalities of gait and mobility     Problem List 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   Neck muscle spasm 12/03/2019   Patellar subluxation,  left, initial encounter 11/14/2019   SI (sacroiliac) joint dysfunction 07/14/2019   Nonallopathic lesion of sacral region 07/14/2019   Nonallopathic lesion of lumbosacral region 07/14/2019   Nonallopathic lesion of thoracic region 07/14/2019   Medial tibial stress syndrome, right, initial encounter 07/14/2019   Palpitations 04/13/2019   PCOS (polycystic ovarian syndrome) 11/16/2016   Physical exam 11/15/2016   Amenorrhea 11/15/2016   Weight gain 11/15/2016   GAD (generalized anxiety disorder) 03/22/2015   MDD (major depressive disorder), recurrent episode, moderate ([place]) 03/17/2015   Lyndee Hensen, PT, DPT 10:43 AM  01/25/21    [facility] [address] [place], [place], [address] Phone: [phone]   Fax:  [phone]  Name: Cheryl [name] MRN: [id number] Date of Birth: 09-18-2000

## 2021-01-31 ENCOUNTER — Encounter: Payer: Self-pay | Admitting: Family Medicine

## 2021-02-01 ENCOUNTER — Encounter: Payer: 59 | Admitting: Physical Therapy

## 2021-02-03 ENCOUNTER — Encounter: Payer: 59 | Admitting: Physical Therapy

## 2021-02-08 ENCOUNTER — Encounter: Payer: Self-pay | Admitting: Family Medicine

## 2021-02-08 ENCOUNTER — Other Ambulatory Visit: Payer: Self-pay

## 2021-02-08 ENCOUNTER — Ambulatory Visit: Payer: 59 | Admitting: Physical Therapy

## 2021-02-08 ENCOUNTER — Encounter: Payer: Self-pay | Admitting: Physical Therapy

## 2021-02-08 DIAGNOSIS — G8929 Other chronic pain: Secondary | ICD-10-CM

## 2021-02-08 DIAGNOSIS — R2689 Other abnormalities of gait and mobility: Secondary | ICD-10-CM | POA: Diagnosis not present

## 2021-02-08 DIAGNOSIS — M25562 Pain in left knee: Secondary | ICD-10-CM | POA: Diagnosis not present

## 2021-02-08 DIAGNOSIS — M6281 Muscle weakness (generalized): Secondary | ICD-10-CM | POA: Diagnosis not present

## 2021-02-08 NOTE — Therapy (Signed)
[facility] [address] [place], [place], [address] Phone: [phone]   Fax:  [phone]  Physical Therapy Treatment  Patient Details  Name: Cheryl [name] MRN: [id number] Date of Birth: [birth year] Referring Provider (PT): Charlann Boxer   Encounter Date: 02/08/2021   PT End of Session - 02/08/21 1444     Visit Number 2    Number of Visits 16    Date for PT Re-Evaluation 03/20/21    Authorization Type [facility]    PT Start Time 0805    PT Stop Time 0843    PT Time Calculation (min) 38 min    Activity Tolerance Patient tolerated treatment well    Behavior During Therapy [facility] for tasks assessed/performed             Past Medical History:  Diagnosis Date   Allergy    Anxiety    Depression    PCOS (polycystic ovarian syndrome) 11/2016    Past Surgical History:  Procedure Laterality Date   WISDOM TOOTH EXTRACTION     WISDOM TOOTH EXTRACTION      There were no vitals filed for this visit.   Subjective Assessment - 02/08/21 1442     Subjective Pt reports doing a bit better, improving ROM and pain. States discomfort only with longer duration standing and walking. Has been doing HEP.    Currently in Pain? Yes    Pain Score 2     Pain Location Knee    Pain Orientation Left    Pain Descriptors / Indicators Aching    Pain Type Acute pain    Pain Onset More than a month ago    Pain Frequency Intermittent                               [facility] Adult PT Treatment/Exercise - 02/08/21 0001       Exercises   Exercises Knee/Hip      Knee/Hip Exercises: Aerobic   Recumbent Bike L2 x 6 min;      Knee/Hip Exercises: Standing   Hip Flexion Knee bent;20 reps    Terminal Knee Extension 20 reps    Theraband Level (Terminal Knee Extension) Level 3 (Green)    Forward Step Up 10 reps;Hand Hold: 1;Step Height: 6";Both    Other Standing Knee Exercises L/R and A/P weight shifts on Air Ex x 20 each;       Knee/Hip Exercises: Seated   Long Arc Quad 15 reps    Sit to [employer] 15 reps   from higher mat tale, GTB at thighs.     Knee/Hip Exercises: Supine   Quad Sets 20 reps    Heel Slides 20 reps    Straight Leg Raises 10 reps;Both      Knee/Hip Exercises: Sidelying   Hip ABduction 10 reps                      PT Short Term Goals - 01/25/21 1011       PT SHORT TERM GOAL #1   Title Pt to be independent with initial HEP    Time 2    Period Weeks    Status New    Target Date 02/06/21               PT Long Term Goals - 01/25/21 1012       PT LONG TERM GOAL #1   Title  Pt to be independent with final HEP    Time 8    Period Weeks    Status New    Target Date 03/20/21      PT LONG TERM GOAL #2   Title Pt to demo full quad and hip strength bil , to improve stability, and ability for stairs, and dynamic activity.    Time 6    Period Weeks    Status New    Target Date 03/20/21      PT LONG TERM GOAL #3   Title Pt to demo [facility] of L knee to be WNL for pt age, with single leg and unstable surface, for safety with community and functional activity.    Time 8    Period Weeks    Status New    Target Date 03/20/21      PT LONG TERM GOAL #4   Title Pt to demo ability for squat, stairs, jogging, and direction changes without pain or apprehension in L knee.    Time 8    Period Weeks    Status New    Target Date 03/20/21      PT LONG TERM GOAL #5   Title Pt to report decreased pain in L knee to 0-2/10 with activity.    Time 8    Period Weeks    Status New    Target Date 03/20/21                   Plan - 02/08/21 1446     Clinical Impression Statement Pt with good improvments today. Much improved ROM, now near Normal, much improved gait mechanics, and improved ability for activity. Able to progress to closed chained strengthening today, with little pain during activities. Noted weakness and instability in L quad vs R. Plan to progress strength as  tolerated. Also with difficulty with TKE in stading with SLS phase for gait and marching.    Personal Factors and Comorbidities Past/Current Experience    Examination-Activity Limitations Stairs;Squat;Lift;Stand;Bend;Locomotion Level    Examination-Participation Restrictions Cleaning;Meal Prep;Yard Work;Community Activity;Laundry;Shop    Stability/Clinical Decision Making Stable/Uncomplicated    Rehab Potential Good    PT Frequency 2x / week    PT Duration 8 weeks    PT Treatment/Interventions ADLs/Self Care Home Management;Cryotherapy;Electrical Stimulation;Iontophoresis '4mg'$ /ml Dexamethasone;Moist Heat;Traction;Ultrasound;DME Instruction;Balance training;Therapeutic exercise;Therapeutic activities;Functional mobility training;Stair training;Gait training;Neuromuscular re-education;Patient/family education;Manual techniques;Vasopneumatic Device;Taping;Dry needling;Passive range of motion;Spinal Manipulations;Joint Manipulations    PT Home Exercise Plan [id number]    Consulted and Agree with Plan of Care Patient             Patient will benefit from skilled therapeutic intervention in order to improve the following deficits and impairments:  Abnormal gait, Pain, Decreased coordination, Decreased mobility, Increased muscle spasms, Decreased strength, Decreased range of motion, Decreased activity tolerance, Decreased balance, Difficulty walking  Visit Diagnosis: Chronic pain of left knee  Muscle weakness (generalized)  Other abnormalities of gait and mobility     Problem List 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   Nonallopathic lesion of sacral region 07/14/2019   Nonallopathic lesion of lumbosacral region 07/14/2019    Nonallopathic lesion of thoracic region 07/14/2019   Medial tibial stress syndrome, right, initial encounter 07/14/2019   Palpitations 04/13/2019   PCOS (  polycystic ovarian syndrome) 11/16/2016   Physical exam 11/15/2016   Amenorrhea 11/15/2016   Weight gain 11/15/2016   GAD (generalized anxiety disorder) 03/22/2015   MDD (major depressive disorder), recurrent episode, moderate ([place]) 03/17/2015   Lyndee Hensen, PT, DPT 2:48 PM  02/08/21    [facility] [place], [place], [address] Phone: [phone]   Fax:  [phone]  Name: Cheryl [name] MRN: [id number] Date of Birth: 06/11/2001

## 2021-02-09 DIAGNOSIS — M25562 Pain in left knee: Secondary | ICD-10-CM | POA: Diagnosis not present

## 2021-02-09 DIAGNOSIS — M24462 Recurrent dislocation, left knee: Secondary | ICD-10-CM | POA: Diagnosis not present

## 2021-02-10 ENCOUNTER — Other Ambulatory Visit: Payer: Self-pay

## 2021-02-10 ENCOUNTER — Other Ambulatory Visit (HOSPITAL_COMMUNITY): Payer: Self-pay

## 2021-02-10 ENCOUNTER — Encounter: Payer: Self-pay | Admitting: Physical Therapy

## 2021-02-10 ENCOUNTER — Ambulatory Visit: Payer: 59 | Admitting: Physical Therapy

## 2021-02-10 DIAGNOSIS — G8929 Other chronic pain: Secondary | ICD-10-CM | POA: Diagnosis not present

## 2021-02-10 DIAGNOSIS — M6281 Muscle weakness (generalized): Secondary | ICD-10-CM | POA: Diagnosis not present

## 2021-02-10 DIAGNOSIS — M25562 Pain in left knee: Secondary | ICD-10-CM | POA: Diagnosis not present

## 2021-02-10 DIAGNOSIS — R2689 Other abnormalities of gait and mobility: Secondary | ICD-10-CM | POA: Diagnosis not present

## 2021-02-10 DIAGNOSIS — E282 Polycystic ovarian syndrome: Secondary | ICD-10-CM | POA: Diagnosis not present

## 2021-02-10 DIAGNOSIS — N926 Irregular menstruation, unspecified: Secondary | ICD-10-CM | POA: Diagnosis not present

## 2021-02-10 MED ORDER — NUVARING 0.12-0.015 MG/24HR VA RING
VAGINAL_RING | VAGINAL | 2 refills | Status: DC
  Filled 2021-02-10: qty 1, 28d supply, fill #0

## 2021-02-10 MED ORDER — MEDROXYPROGESTERONE ACETATE 10 MG PO TABS
10.0000 mg | ORAL_TABLET | Freq: Every day | ORAL | 0 refills | Status: DC
  Filled 2021-02-10: qty 10, 10d supply, fill #0

## 2021-02-10 NOTE — Therapy (Signed)
[facility] [address] [place], [place], [address] Phone: [phone]   Fax:  [phone]  Physical Therapy Treatment  Patient Details  Name: Cheryl [name] MRN: [id number] Date of Birth: 02-05-2001 Referring Provider (PT): Charlann Boxer   Encounter Date: 02/10/2021   PT End of Session - 02/10/21 1247     Visit Number 3    Number of Visits 16    Date for PT Re-Evaluation 03/20/21    Authorization Type [facility]    PT Start Time [id number]    PT Stop Time 1323    PT Time Calculation (min) 42 min    Activity Tolerance Patient tolerated treatment well    Behavior During Therapy [facility] for tasks assessed/performed             Past Medical History:  Diagnosis Date   Allergy    Anxiety    Depression    PCOS (polycystic ovarian syndrome) 11/2016    Past Surgical History:  Procedure Laterality Date   WISDOM TOOTH EXTRACTION     WISDOM TOOTH EXTRACTION      There were no vitals filed for this visit.   Subjective Assessment - 02/10/21 1245     Subjective Pt states doing ok, has been doing HEP. Did see Ortho with good report following MRI. Mild soreness with increased activity.    Currently in Pain? Yes    Pain Score 2     Pain Location Knee    Pain Orientation Left    Pain Descriptors / Indicators Aching    Pain Type Acute pain    Pain Onset More than a month ago    Pain Frequency Intermittent                               [facility] Adult PT Treatment/Exercise - 02/10/21 0001       Exercises   Exercises Knee/Hip      Knee/Hip Exercises: Aerobic   Recumbent Bike L2 x 7 min;      Knee/Hip Exercises: Standing   Hip Flexion Knee bent;20 reps    Terminal Knee Extension 20 reps    Theraband Level (Terminal Knee Extension) Level 3 (Green)    Lateral Step Up 15 reps;Hand Hold: 1;Step Height: 6";Left    Forward Step Up Hand Hold: 1;Step Height: 6";Both;15 reps    Other Standing Knee Exercises SLS 30 sec x 2,  SLS with head turns x 10 on L;    Other Standing Knee Exercises Walk/march 10 ft x 2;  Bwd walking 25 ft x 4;  ambulation quick pace 35 ft x 8;      Knee/Hip Exercises: Seated   Sit to [place] 15 reps   from higher mat tale, GTB at thighs.     Knee/Hip Exercises: Supine   Straight Leg Raises Both;15 reps    Straight Leg Raises Limitations x10 with slight ER                      PT Short Term Goals - 01/25/21 1011       PT SHORT TERM GOAL #1   Title Pt to be independent with initial HEP    Time 2    Period Weeks    Status New    Target Date 02/06/21               PT Long Term Goals - 01/25/21 1012  PT LONG TERM GOAL #1   Title Pt to be independent with final HEP    Time 8    Period Weeks    Status New    Target Date 03/20/21      PT LONG TERM GOAL #2   Title Pt to demo full quad and hip strength bil , to improve stability, and ability for stairs, and dynamic activity.    Time 6    Period Weeks    Status New    Target Date 03/20/21      PT LONG TERM GOAL #3   Title Pt to demo [facility] of L knee to be WNL for pt age, with single leg and unstable surface, for safety with community and functional activity.    Time 8    Period Weeks    Status New    Target Date 03/20/21      PT LONG TERM GOAL #4   Title Pt to demo ability for squat, stairs, jogging, and direction changes without pain or apprehension in L knee.    Time 8    Period Weeks    Status New    Target Date 03/20/21      PT LONG TERM GOAL #5   Title Pt to report decreased pain in L knee to 0-2/10 with activity.    Time 8    Period Weeks    Status New    Target Date 03/20/21                   Plan - 02/10/21 1350     Clinical Impression Statement Pt with good progress, improved ambulation mechanics and speed. She has improving stability and ability for SLS. She has some diffiuclty with full TKE in supine and standing wtih activities today, will continue to work on this. Plan to  progress strength and stability to unstable surface, and dynamic movement as tolerated.    Personal Factors and Comorbidities Past/Current Experience    Examination-Activity Limitations Stairs;Squat;Lift;Stand;Bend;Locomotion Level    Examination-Participation Restrictions Cleaning;Meal Prep;Yard Work;Community Activity;Laundry;Shop    Stability/Clinical Decision Making Stable/Uncomplicated    Rehab Potential Good    PT Frequency 2x / week    PT Duration 8 weeks    PT Treatment/Interventions ADLs/Self Care Home Management;Cryotherapy;Electrical Stimulation;Iontophoresis '4mg'$ /ml Dexamethasone;Moist Heat;Traction;Ultrasound;DME Instruction;Balance training;Therapeutic exercise;Therapeutic activities;Functional mobility training;Stair training;Gait training;Neuromuscular re-education;Patient/family education;Manual techniques;Vasopneumatic Device;Taping;Dry needling;Passive range of motion;Spinal Manipulations;Joint Manipulations    PT Home Exercise Plan [id number]    Consulted and Agree with Plan of Care Patient             Patient will benefit from skilled therapeutic intervention in order to improve the following deficits and impairments:  Abnormal gait, Pain, Decreased coordination, Decreased mobility, Increased muscle spasms, Decreased strength, Decreased range of motion, Decreased activity tolerance, Decreased balance, Difficulty walking  Visit Diagnosis: Chronic pain of left knee  Muscle weakness (generalized)  Other abnormalities of gait and mobility     Problem List 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   Nonallopathic lesion of sacral region 07/14/2019   Nonallopathic lesion of lumbosacral region 07/14/2019    Nonallopathic lesion of thoracic region 07/14/2019   Medial tibial stress syndrome, right, initial encounter 07/14/2019   Palpitations 04/13/2019   PCOS (polycystic ovarian syndrome)  11/16/2016   Physical exam 11/15/2016   Amenorrhea 11/15/2016   Weight gain 11/15/2016   GAD (generalized anxiety disorder) 03/22/2015   MDD (major depressive disorder), recurrent episode, moderate ([place]) 03/17/2015    Lyndee Hensen, PT, DPT 1:52 PM  02/10/21    [facility] [place], [place], [address] Phone: [phone]   Fax:  [phone]  Name: Cheryl [name] MRN: [id number] Date of Birth: 2001/05/03

## 2021-02-15 ENCOUNTER — Encounter: Payer: 59 | Admitting: Physical Therapy

## 2021-02-16 ENCOUNTER — Ambulatory Visit: Payer: 59 | Admitting: Family Medicine

## 2021-02-16 ENCOUNTER — Other Ambulatory Visit (HOSPITAL_COMMUNITY): Payer: Self-pay

## 2021-02-17 ENCOUNTER — Encounter: Payer: Self-pay | Admitting: Physical Therapy

## 2021-02-17 ENCOUNTER — Other Ambulatory Visit: Payer: Self-pay

## 2021-02-17 ENCOUNTER — Ambulatory Visit: Payer: 59 | Admitting: Physical Therapy

## 2021-02-17 DIAGNOSIS — M25562 Pain in left knee: Secondary | ICD-10-CM | POA: Diagnosis not present

## 2021-02-17 DIAGNOSIS — G8929 Other chronic pain: Secondary | ICD-10-CM | POA: Diagnosis not present

## 2021-02-17 DIAGNOSIS — M6281 Muscle weakness (generalized): Secondary | ICD-10-CM

## 2021-02-17 DIAGNOSIS — R2689 Other abnormalities of gait and mobility: Secondary | ICD-10-CM

## 2021-02-17 NOTE — Therapy (Signed)
Cheryl [name] [address] [place], [place], [address] Phone: [phone]   Fax:  [phone]  Physical Therapy Treatment  Patient Details  Name: Cheryl [name] MRN: [id number] Date of Birth: 07-Jul-2000 Referring Provider (PT): Charlann Boxer   Encounter Date: 02/17/2021   PT End of Session - 02/17/21 1250     Visit Number 4    Number of Visits 16    Date for PT Re-Evaluation 03/20/21    Authorization Type [facility]    PT Start Time [id number]    PT Stop Time 1321    PT Time Calculation (min) 39 min    Activity Tolerance Patient tolerated treatment well    Behavior During Therapy [facility] for tasks assessed/performed             Past Medical History:  Diagnosis Date   Allergy    Anxiety    Depression    PCOS (polycystic ovarian syndrome) 11/2016    Past Surgical History:  Procedure Laterality Date   WISDOM TOOTH EXTRACTION     WISDOM TOOTH EXTRACTION      There were no vitals filed for this visit.   Subjective Assessment - 02/17/21 1246     Subjective Pt has had some increased soreness this week.    Currently in Pain? Yes    Pain Score 3     Pain Location Knee    Pain Orientation Left    Pain Descriptors / Indicators Aching    Pain Type Acute pain    Pain Onset More than a month ago    Pain Frequency Intermittent                               [facility] Adult PT Treatment/Exercise - 02/17/21 0001       Exercises   Exercises Knee/Hip      Knee/Hip Exercises: Aerobic   Recumbent Bike L2 x 7 min;      Knee/Hip Exercises: Standing   Forward Step Up Step Height: 6";Both;15 reps    Forward Step Up Limitations with opp leg march    Functional Squat 15 reps    Functional Squat Limitations mini squat on Air[employer].    Other Standing Knee Exercises SLS with head turns x 10 on L;  SLS reach to chair x 10 bil;    Other Standing Knee Exercises Walk/march 10 ft x 4, fwd and bwd.      Knee/Hip Exercises: Seated    Long Arc Quad 20 reps    Long Arc Quad Limitations 4lb    Sit to [employer] 15 reps   10 lb weight     Knee/Hip Exercises: Supine   Straight Leg Raises 20 reps;Left    Straight Leg Raises Limitations with TKE      Knee/Hip Exercises: Sidelying   Hip ABduction 20 reps;Left      Manual Therapy   Manual Therapy Taping    Kinesiotex Ligament Correction      Kinesiotix   Ligament Correction 2 I strips for patella tracking, 1 I strip for patella stabilization                      PT Short Term Goals - 01/25/21 1011       PT SHORT TERM GOAL #1   Title Pt to be independent with initial HEP    Time 2    Period Weeks  Status New    Target Date 02/06/21               PT Long Term Goals - 01/25/21 1012       PT LONG TERM GOAL #1   Title Pt to be independent with final HEP    Time 8    Period Weeks    Status New    Target Date 03/20/21      PT LONG TERM GOAL #2   Title Pt to demo full quad and hip strength bil , to improve stability, and ability for stairs, and dynamic activity.    Time 6    Period Weeks    Status New    Target Date 03/20/21      PT LONG TERM GOAL #3   Title Pt to demo [facility] of L knee to be WNL for pt age, with single leg and unstable surface, for safety with community and functional activity.    Time 8    Period Weeks    Status New    Target Date 03/20/21      PT LONG TERM GOAL #4   Title Pt to demo ability for squat, stairs, jogging, and direction changes without pain or apprehension in L knee.    Time 8    Period Weeks    Status New    Target Date 03/20/21      PT LONG TERM GOAL #5   Title Pt to report decreased pain in L knee to 0-2/10 with activity.    Time 8    Period Weeks    Status New    Target Date 03/20/21                   Plan - 02/17/21 1337     Clinical Impression Statement Pt doing well with ability to progress ther ex for strength and stabilization. She is challenged with single leg activities due to  instability.Mild increased soreness at medial knee with SLS today. K-tape trial for stabilzation of patella. Plan to progres strength as tolerated.    Personal Factors and Comorbidities Past/Current Experience    Examination-Activity Limitations Stairs;Squat;Lift;Stand;Bend;Locomotion Level    Examination-Participation Restrictions Cleaning;Meal Prep;Yard Work;Community Activity;Laundry;Shop    Stability/Clinical Decision Making Stable/Uncomplicated    Rehab Potential Good    PT Frequency 2x / week    PT Duration 8 weeks    PT Treatment/Interventions ADLs/Self Care Home Management;Cryotherapy;Electrical Stimulation;Iontophoresis '4mg'$ /ml Dexamethasone;Moist Heat;Traction;Ultrasound;DME Instruction;Balance training;Therapeutic exercise;Therapeutic activities;Functional mobility training;Stair training;Gait training;Neuromuscular re-education;Patient/family education;Manual techniques;Vasopneumatic Device;Taping;Dry needling;Passive range of motion;Spinal Manipulations;Joint Manipulations    PT Home Exercise Plan [email or web address]    Consulted and Agree with Plan of Care Patient             Patient will benefit from skilled therapeutic intervention in order to improve the following deficits and impairments:  Abnormal gait, Pain, Decreased coordination, Decreased mobility, Increased muscle spasms, Decreased strength, Decreased range of motion, Decreased activity tolerance, Decreased balance, Difficulty walking  Visit Diagnosis: Chronic pain of left knee  Muscle weakness (generalized)  Other abnormalities of gait and mobility     Problem List 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   Nonallopathic lesion of sacral region 07/14/2019    Nonallopathic lesion of lumbosacral region 07/14/2019  Nonallopathic lesion of thoracic region 07/14/2019   Medial tibial stress syndrome, right, initial encounter 07/14/2019   Palpitations 04/13/2019   PCOS (polycystic ovarian syndrome) 11/16/2016   Physical exam 11/15/2016   Amenorrhea 11/15/2016   Weight gain 11/15/2016   GAD (generalized anxiety disorder) 03/22/2015   MDD (major depressive disorder), recurrent episode, moderate ([employer]) 03/17/2015    Lyndee Hensen, PT, DPT 1:41 PM  02/17/21   [place], [place], [address] Phone: [phone]   Fax:  [phone]  Name: ARION MATTOX MRN: [id number] Date of Birth: [birth year]

## 2021-02-21 ENCOUNTER — Other Ambulatory Visit (HOSPITAL_COMMUNITY): Payer: Self-pay

## 2021-02-21 MED ORDER — ETONOGESTREL-ETHINYL ESTRADIOL 0.12-0.015 MG/24HR VA RING
VAGINAL_RING | VAGINAL | 6 refills | Status: DC
  Filled 2021-02-21: qty 3, 84d supply, fill #0
  Filled 2021-04-21 – 2021-07-19 (×3): qty 3, 84d supply, fill #1
  Filled 2022-02-16: qty 3, 84d supply, fill #2

## 2021-02-21 NOTE — Progress Notes (Signed)
  [place] Phone: [phone] Subjective:   Cheryl Cheryl [name], am serving as a scribe for Dr. Hulan Saas.  This visit occurred during the SARS-CoV-2 public health emergency.  Safety protocols were in place, including screening questions prior to the visit, additional usage of staff PPE, and extensive cleaning of exam room while observing appropriate contact time as indicated for disinfecting solutions.    I'm seeing this patient by the request  of:  Midge Minium, MD  CC: Low back pain follow-up  [id number]  Cheryl Cheryl [name] is a 20 y.o. female coming in with complaint of back and neck pain. OMT on 01/19/2021. L knee pain. Patient states she has been doing fine since last visit.   Medications patient has been prescribed: Zanaflex         Past Medical History:  Diagnosis Date   Allergy    Anxiety    Depression    PCOS (polycystic ovarian syndrome) 11/2016    Cheryl [name] Known Allergies   Review of Systems:  Cheryl [name] headache, visual changes, nausea, vomiting, diarrhea, constipation, dizziness, abdominal pain, skin rash, fevers, chills, night sweats, weight loss, swollen lymph nodes, body aches, joint swelling, chest pain, shortness of breath, mood changes. POSITIVE muscle aches  Objective  Blood pressure 98/76, pulse 83, height '5\' 6"'$  (1.676 m), weight 208 lb (94.3 kg), SpO2 98 %.   General: Cheryl [name], dressed appropriately.  HEENT: Pupils equal, extraocular movements intact  Respiratory: Patient's speak in full sentences and does not appear short of breath  Cardiovascular: Cheryl [name], non tender, Cheryl [name]  Low back exam patient has more tenderness at the thoracolumbar juncture.  Negative straight leg test.  Patient does have a mild positive Cheryl Cheryl [name] on the left side.  Osteopathic findings  C2 flexed rotated and side bent right C5  flexed rotated and side bent left T3 extended rotated and side bent right inhaled rib T9 extended rotated and side bent left L2 flexed rotated and side bent right Sacrum right on right       Assessment and Plan:  SI (sacroiliac) joint dysfunction Patient is well.  Discussed icing regimen and home exercises.  We discussed with patient patient is responding very well to manipulation  Today with her school as well as working.  Follow-up with me again in 6-8 weeks  Patellar subluxation, left, initial encounter He is doing significantly better at this time.  Continue to wear the brace.  Discussed with her to wear it less at this moment.  Patient's MRI was unremarkable.  Follow-up with me again 6 weeks   Nonallopathic problems  Decision today to treat with OMT was based on Physical Exam  After verbal consent patient was treated with HVLA, ME, FPR techniques in cervical, rib, thoracic, lumbar, and sacral  areas  Patient tolerated the procedure well with improvement in symptoms  Patient given exercises, stretches and lifestyle modifications  See medications in patient instructions if given  Patient will follow up in 4-8 weeks      The above documentation has been reviewed and is accurate and complete Cheryl Pulley, DO        Note: This dictation was prepared with Dragon dictation along with smaller phrase technology. Any transcriptional errors that result from this process are unintentional.

## 2021-02-22 ENCOUNTER — Encounter: Payer: 59 | Admitting: Physical Therapy

## 2021-02-23 ENCOUNTER — Ambulatory Visit: Payer: 59 | Admitting: Family Medicine

## 2021-02-23 ENCOUNTER — Other Ambulatory Visit: Payer: Self-pay

## 2021-02-23 ENCOUNTER — Encounter: Payer: Self-pay | Admitting: Family Medicine

## 2021-02-23 VITALS — BP 98/76 | HR 83 | Ht 66.0 in | Wt 208.0 lb

## 2021-02-23 DIAGNOSIS — M9904 Segmental and somatic dysfunction of sacral region: Secondary | ICD-10-CM | POA: Diagnosis not present

## 2021-02-23 DIAGNOSIS — M9901 Segmental and somatic dysfunction of cervical region: Secondary | ICD-10-CM

## 2021-02-23 DIAGNOSIS — M9903 Segmental and somatic dysfunction of lumbar region: Secondary | ICD-10-CM

## 2021-02-23 DIAGNOSIS — M9908 Segmental and somatic dysfunction of rib cage: Secondary | ICD-10-CM | POA: Diagnosis not present

## 2021-02-23 DIAGNOSIS — M533 Sacrococcygeal disorders, not elsewhere classified: Secondary | ICD-10-CM

## 2021-02-23 DIAGNOSIS — M9902 Segmental and somatic dysfunction of thoracic region: Secondary | ICD-10-CM

## 2021-02-23 DIAGNOSIS — S83002A Unspecified subluxation of left patella, initial encounter: Secondary | ICD-10-CM

## 2021-02-23 NOTE — Patient Instructions (Signed)
See me in 6-8 weeks 

## 2021-02-23 NOTE — Assessment & Plan Note (Signed)
He is doing significantly better at this time.  Continue to wear the brace.  Discussed with her to wear it less at this moment.  Patient's MRI was unremarkable.  Follow-up with me again 6 weeks

## 2021-02-23 NOTE — Assessment & Plan Note (Signed)
Patient is well.  Discussed icing regimen and home exercises.  We discussed with patient patient is responding very well to manipulation  Today with her school as well as working.  Follow-up with me again in 6-8 weeks

## 2021-02-24 ENCOUNTER — Encounter: Payer: 59 | Admitting: Physical Therapy

## 2021-02-24 ENCOUNTER — Ambulatory Visit: Payer: 59 | Admitting: Family Medicine

## 2021-03-01 ENCOUNTER — Encounter: Payer: 59 | Admitting: Physical Therapy

## 2021-03-02 ENCOUNTER — Other Ambulatory Visit: Payer: Self-pay | Admitting: Family Medicine

## 2021-03-02 ENCOUNTER — Other Ambulatory Visit (HOSPITAL_COMMUNITY): Payer: Self-pay

## 2021-03-03 ENCOUNTER — Encounter: Payer: 59 | Admitting: Physical Therapy

## 2021-03-03 ENCOUNTER — Other Ambulatory Visit (HOSPITAL_COMMUNITY): Payer: Self-pay

## 2021-03-03 MED ORDER — TIZANIDINE HCL 2 MG PO TABS
ORAL_TABLET | Freq: Every day | ORAL | 0 refills | Status: DC
Start: 2021-03-03 — End: 2021-04-18
  Filled 2021-03-03 – 2021-03-13 (×2): qty 15, 15d supply, fill #0

## 2021-03-10 ENCOUNTER — Other Ambulatory Visit: Payer: Self-pay

## 2021-03-10 ENCOUNTER — Other Ambulatory Visit (HOSPITAL_COMMUNITY): Payer: Self-pay

## 2021-03-10 ENCOUNTER — Encounter: Payer: Self-pay | Admitting: Physical Therapy

## 2021-03-10 ENCOUNTER — Ambulatory Visit: Payer: 59 | Admitting: Physical Therapy

## 2021-03-10 DIAGNOSIS — R2689 Other abnormalities of gait and mobility: Secondary | ICD-10-CM

## 2021-03-10 DIAGNOSIS — M6281 Muscle weakness (generalized): Secondary | ICD-10-CM | POA: Diagnosis not present

## 2021-03-10 DIAGNOSIS — M25562 Pain in left knee: Secondary | ICD-10-CM | POA: Diagnosis not present

## 2021-03-10 DIAGNOSIS — G8929 Other chronic pain: Secondary | ICD-10-CM | POA: Diagnosis not present

## 2021-03-10 NOTE — Therapy (Signed)
[facility] [address] [place], [place], [address] Phone: [phone]   Fax:  [phone]  Physical Therapy Treatment/Re-Cert   Patient Details  Name: Cheryl [name] MRN: [id number] Date of Birth: Sep 15, 2000 Referring Provider (PT): Charlann Boxer   Encounter Date: 03/10/2021   PT End of Session - 03/10/21 1353     Visit Number 5    Number of Visits 16    Date for PT Re-Evaluation 04/21/21    Authorization Type [facility]     recert at visit 5    PT Start Time 0930    PT Stop Time 1015    PT Time Calculation (min) 45 min    Activity Tolerance Patient tolerated treatment well    Behavior During Therapy [facility] for tasks assessed/performed             Past Medical History:  Diagnosis Date   Allergy    Anxiety    Depression    PCOS (polycystic ovarian syndrome) 11/2016    Past Surgical History:  Procedure Laterality Date   WISDOM TOOTH EXTRACTION     WISDOM TOOTH EXTRACTION      There were no vitals filed for this visit.   Subjective Assessment - 03/10/21 1349     Subjective Pt last seen 9/2, she was sick. States knee doing very well, has been trying to walk more and do more activity. Still has not returned to LE activities at the gym. States conitnued, mild soreness at medial knee with increased activity, standing, walking.    Currently in Pain? Yes    Pain Score 3     Pain Location Knee    Pain Orientation Left    Pain Descriptors / Indicators Aching    Pain Type Acute pain    Pain Onset More than a month ago    Pain Frequency Intermittent                [facility] PT Assessment - 03/10/21 0001       Assessment   Medical Diagnosis L kneepain    Referring Provider (PT) Charlann Boxer    Prior Therapy yes, after previous dislocations      Precautions   Precautions None      Balance Screen   Has the patient fallen in the past 6 months No      Prior Function   Level of Independence Independent      Cognition    Overall Cognitive Status Within Functional Limits for tasks assessed      AROM   Overall AROM Comments WNL      PROM   Overall PROM Comments WNL      Strength   Overall Strength Comments L knee flex: 4+/5, ext: 4/5,  L Hip: 4/5,      Palpation   Palpation comment Tenderness at medial patella, mild swelling around patella      Special Tests   Other special tests Much difficulty and weakness with functional strength today, 12 in step up, SL sit to stand, and SL RDL. compared to R side      Ambulation/Gait   Gait Comments --                           [facility] Adult PT Treatment/Exercise - 03/10/21 0001       Self-Care   Self-Care Other Self-Care Comments    Other Self-Care Comments  eduction on safe gym/strengthening exercises,  and on self k-tape for knee      Exercises   Exercises Knee/Hip      Knee/Hip Exercises: Aerobic   Recumbent Bike L2 x 8 min;      Knee/Hip Exercises: Standing   Lateral Step Up 15 reps;Left    Lateral Step Up Limitations 12 in step    Forward Step Up Both;15 reps   12 in step   Forward Step Up Limitations --    Functional Squat 20 reps    Functional Squat Limitations 25 lb bar    Other Standing Knee Exercises Deadlift  45 lb x15;    SL RDL 10lb x 10 bil    Other Standing Knee Exercises Walk/march 10 ft x 4, fwd and bwd.      Knee/Hip Exercises: Seated   Long Arc Quad --    Long Arc [employer] Limitations --    Sit to [employer] 15 reps   Single leg with ecc lowering.     Knee/Hip Exercises: Supine   Straight Leg Raises --    Straight Leg Raises Limitations --      Knee/Hip Exercises: Sidelying   Hip ABduction --      Manual Therapy   Manual Therapy Taping    Kinesiotex Ligament Correction      Kinesiotix   Ligament Correction 2 I strips for patella tracking, 1 I strip for patella stabilization                     PT Education - 03/10/21 1352     Education Details HEP updated, discussed easy progression back to LE  strengthening at gym, and light jogging intervals, educated on k-tape technique    Person(s) Educated Patient    Methods Explanation;Demonstration;Tactile cues;Verbal cues;Handout    Comprehension Verbalized understanding;Returned demonstration;Verbal cues required;Need further instruction;Tactile cues required              PT Short Term Goals - 03/10/21 1354       PT SHORT TERM GOAL #1   Title Pt to be independent with initial HEP    Time 2    Period Weeks    Status Achieved               PT Long Term Goals - 03/10/21 1356       PT LONG TERM GOAL #1   Title Pt to be independent with final HEP    Time 8    Period Weeks    Status Partially Met    Target Date 04/21/21      PT LONG TERM GOAL #2   Title Pt to demo full quad and hip strength bil , to improve stability, and ability for stairs, and dynamic activity.    Time 6    Period Weeks    Status On-going    Target Date 04/21/21      PT LONG TERM GOAL #3   Title Pt to demo [facility] of L knee to be WNL for pt age, with single leg and unstable surface, for safety with community and functional activity.    Time 8    Period Weeks    Status On-going    Target Date 04/21/21      PT LONG TERM GOAL #4   Title Pt to demo ability for squat, stairs, jogging, and direction changes without pain or apprehension in L knee.    Time 8    Period Weeks    Status On-going  Target Date 04/21/21      PT LONG TERM GOAL #5   Title Pt to report decreased pain in L knee to 0-2/10 with activity.    Time 8    Period Weeks    Status Partially Met    Target Date 04/21/21                   Plan - 03/10/21 1358     Clinical Impression Statement Pt has been seen for 5 visits. She is doing much better than initially, with less pain. She still has significant weakness in L LE vs R, seen with testing and single leg strengthening exercises today. Pt will require continued care, for improving strength, to avoid re-injury in the  future. Discussed safe strengthening exerciseses to continue to progress at this time, as well as starting light jogging intervals as tolerated. Education done for self k-taping for patella support and pain. Pt to benefit from continued PT to improve strength, stability and to meet LTGs.    Personal Factors and Comorbidities Past/Current Experience    Examination-Activity Limitations Stairs;Squat;Lift;Stand;Bend;Locomotion Level    Examination-Participation Restrictions Cleaning;Meal Prep;Yard Work;Community Activity;Laundry;Shop    Stability/Clinical Decision Making Stable/Uncomplicated    Rehab Potential Good    PT Frequency 2x / week    PT Duration 8 weeks    PT Treatment/Interventions ADLs/Self Care Home Management;Cryotherapy;Electrical Stimulation;Iontophoresis 81m/ml Dexamethasone;Moist Heat;Traction;Ultrasound;DME Instruction;Balance training;Therapeutic exercise;Therapeutic activities;Functional mobility training;Stair training;Gait training;Neuromuscular re-education;Patient/family education;Manual techniques;Vasopneumatic Device;Taping;Dry needling;Passive range of motion;Spinal Manipulations;Joint Manipulations    PT Home Exercise Plan [id number]    Consulted and Agree with Plan of Care Patient             Patient will benefit from skilled therapeutic intervention in order to improve the following deficits and impairments:  Abnormal gait, Pain, Decreased coordination, Decreased mobility, Increased muscle spasms, Decreased strength, Decreased range of motion, Decreased activity tolerance, Decreased balance, Difficulty walking  Visit Diagnosis: Chronic pain of left knee  Muscle weakness (generalized)  Other abnormalities of gait and mobility     Problem List 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   Neck muscle spasm 12/03/2019   Patellar  subluxation, left, initial encounter 11/14/2019   SI (sacroiliac) joint dysfunction 07/14/2019   Nonallopathic lesion of sacral region 07/14/2019   Nonallopathic lesion of lumbosacral region 07/14/2019   Nonallopathic lesion of thoracic region 07/14/2019   Medial tibial stress syndrome, right, initial encounter 07/14/2019   Palpitations 04/13/2019   PCOS (polycystic ovarian syndrome) 11/16/2016   Physical exam 11/15/2016   Amenorrhea 11/15/2016   Weight gain 11/15/2016   GAD (generalized anxiety disorder) 03/22/2015   MD[place] (major depressive disorder), [place], moderate [place] 03/17/[place]    LLyndee Hensen PT, DPT 2:07 PM  03/10/21    [place] [address]Phone: [phone]  Fax:  [phone] Name: Cheryl RYLEMRN: [id number] of Birth: 8Feb 08, 2002

## 2021-03-10 NOTE — Patient Instructions (Signed)
Access Code: [email or web address] URL: [email or web address] Date: 03/10/2021 Prepared by: Lyndee Hensen  Exercises Supine Quadricep Sets - 2-3 x daily - 2 sets - 10 reps Straight Leg Raise - 1 x daily - 2 sets - 10 reps Sidelying Hip Abduction - 1 x daily - 2 sets - 10 reps Seated Knee Extension AROM - 2 x daily - 1 sets - 10 reps Step Up - 1 x daily - 1-2 sets - 10 reps Single Leg Deadlift with Kettlebell - 1 x daily - 2 sets - 10 reps Kettlebell Deadlift - 1 x daily - 2 sets - 10 reps Sit to Stand - 1 x daily - 1-2 sets - 10 reps Mini Squat - 1 x daily - 2 sets - 10 reps

## 2021-03-13 ENCOUNTER — Other Ambulatory Visit (HOSPITAL_COMMUNITY): Payer: Self-pay

## 2021-03-17 DIAGNOSIS — D225 Melanocytic nevi of trunk: Secondary | ICD-10-CM | POA: Diagnosis not present

## 2021-03-17 DIAGNOSIS — L812 Freckles: Secondary | ICD-10-CM | POA: Diagnosis not present

## 2021-03-17 DIAGNOSIS — D2272 Melanocytic nevi of left lower limb, including hip: Secondary | ICD-10-CM | POA: Diagnosis not present

## 2021-03-17 DIAGNOSIS — L738 Other specified follicular disorders: Secondary | ICD-10-CM | POA: Diagnosis not present

## 2021-03-17 DIAGNOSIS — D2261 Melanocytic nevi of right upper limb, including shoulder: Secondary | ICD-10-CM | POA: Diagnosis not present

## 2021-03-21 ENCOUNTER — Other Ambulatory Visit (HOSPITAL_COMMUNITY): Payer: Self-pay

## 2021-03-21 ENCOUNTER — Other Ambulatory Visit: Payer: Self-pay

## 2021-03-21 ENCOUNTER — Telehealth (INDEPENDENT_AMBULATORY_CARE_PROVIDER_SITE_OTHER): Payer: 59 | Admitting: Registered Nurse

## 2021-03-21 DIAGNOSIS — J22 Unspecified acute lower respiratory infection: Secondary | ICD-10-CM | POA: Diagnosis not present

## 2021-03-21 DIAGNOSIS — B3731 Acute candidiasis of vulva and vagina: Secondary | ICD-10-CM

## 2021-03-21 MED ORDER — AZITHROMYCIN 250 MG PO TABS
ORAL_TABLET | ORAL | 0 refills | Status: AC
Start: 2021-03-21 — End: 2021-03-26
  Filled 2021-03-21: qty 6, 5d supply, fill #0

## 2021-03-21 MED ORDER — BENZONATATE 100 MG PO CAPS
100.0000 mg | ORAL_CAPSULE | Freq: Two times a day (BID) | ORAL | 0 refills | Status: DC | PRN
  Filled 2021-03-21: qty 20, 10d supply, fill #0

## 2021-03-21 MED ORDER — FLUCONAZOLE 150 MG PO TABS
150.0000 mg | ORAL_TABLET | Freq: Once | ORAL | 0 refills | Status: AC
Start: 2021-03-21 — End: 2021-03-22
  Filled 2021-03-21: qty 1, 1d supply, fill #0

## 2021-03-21 MED ORDER — PREDNISONE 10 MG (21) PO TBPK
ORAL_TABLET | ORAL | 0 refills | Status: DC
  Filled 2021-03-21: qty 21, 6d supply, fill #0

## 2021-03-21 NOTE — Progress Notes (Signed)
Telemedicine Encounter- SOAP NOTE Established Patient  This video encounter was conducted with the patient's (or proxy's) verbal consent via audio telecommunications: yes/no: Yes Patient was instructed to have this encounter in a suitably private space; and to only have persons present to whom they give permission to participate. In addition, patient identity was confirmed by use of name plus two identifiers (DOB and address).  I discussed the limitations, risks, security and privacy concerns of performing an evaluation and management service by telephone and the availability of in person appointments. I also discussed with the patient that there may be a patient responsible charge related to this service. The patient expressed understanding and agreed to proceed.  I spent a total of 16 minutes talking with the patient or their proxy.  Patient at home Provider in office  Participants: Kathrin Ruddy, NP and [name]  Chief Complaint  Patient presents with   Cough    Pt reports cough and congestion starting Saturday with fatigue, and sore throat, pt denies fevers has had some chills, pt tested negative for COVID Sunday    Subjective   [place] is a 20 y.o. established patient. Video visit today for cough  HPI Saturday morning woke with sore throat, thought to be allergies.  Pnd, some cough.  Worsened - dysphagia, malaise Developed into chills, productive cough Runny nose, nasal congestion  Some improvement on Monday, symptoms lingering since then.  No distinct fever  Long hx of bronchitis  Covid test negative on Sunday - Notes sick contact - sinus infection  Denies nvd, fever, lightheadedness, shob  Has tried to treat with dayquil and nyquil. Some help.  Vitamin C supplements.   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   Neck  muscle spasm 12/03/2019   Patellar subluxation, left, initial encounter 11/14/2019   SI (sacroiliac) joint dysfunction 07/14/2019   Nonallopathic lesion of sacral region 07/14/2019   Nonallopathic lesion of lumbosacral region 07/14/2019   Nonallopathic lesion of thoracic region 07/14/2019   Medial tibial stress syndrome, right, initial encounter 07/14/2019   Palpitations 04/13/2019   PCOS (polycystic ovarian syndrome) 11/16/2016   Physical exam 11/15/2016   Amenorrhea 11/15/2016   Weight gain 11/15/2016   GAD (generalized anxiety disorder) 03/22/2015   MDD (major depressive disorder), recurrent episode, moderate ([place]) 03/17/2015    Past Medical History:  Diagnosis Date   Allergy    Anxiety    Depression    PCOS (polycystic ovarian syndrome) 11/2016    Current Outpatient Medications  Medication Sig Dispense Refill   albuterol (VENTOLIN HFA) 108 (90 Base) MCG/ACT inhaler INHALE 2 PUFFS INTO THE LUNGS EVERY 6 (SIX) HOURS AS NEEDED FOR WHEEZING OR SHORTNESS OF BREATH. 18 g 0   ALPRAZolam (XANAX) 0.5 MG tablet TAKE 1 TABLET BY MOUTH 2 TIMES DAILY AS NEEDED FOR ANXIETY. 30 tablet 1   azithromycin (ZITHROMAX) 250 MG tablet Take 2 tablets on day 1, then 1 tablet daily on days 2 through 5 6 tablet 0   buPROPion (WELLBUTRIN XL) 150 MG 24 hr tablet Take 1 tablet (150 mg total) by mouth daily. 30 tablet 3   cycloSPORINE (RESTASIS) 0.05 % ophthalmic emulsion Instill 1 drop into both eyes twice a day 180 each 3   EPINEPHrine 0.3 mg/0.3 mL IJ SOAJ injection   1   etonogestrel-ethinyl estradiol (NUVARING) 0.12-0.015 MG/24HR vaginal ring INSERT 1 RING VAGINALLY  ONCE EVERY 4 WEEKS; LEAVE IN FOR 3 WEEKS AND REMOVE FOR 1 WEEK AS DIRECTED BY PROVIDER 3 each 6   fexofenadine-pseudoephedrine (ALLEGRA-D ALLERGY & CONGESTION) 180-240 MG 24 hr tablet Take 1 tablet by mouth daily. 20 tablet 0   fluconazole (DIFLUCAN) 150 MG tablet Take 1 tablet (150 mg total) by mouth once for 1 dose. 1 tablet 0   FLUoxetine  (PROZAC) 40 MG capsule TAKE 1 CAPSULE BY MOUTH ONCE A DAY 90 capsule 1   fluticasone (FLONASE) 50 MCG/ACT nasal spray INSTILL 1 TO 2 SPRAYS INTO BOTH NOSTRILS DAILY 16 g 0   loratadine-pseudoephedrine (CLARITIN-D 24 HOUR) 10-240 MG 24 hr tablet Take 1 tablet by mouth daily. 30 tablet 0   medroxyPROGESTERone (PROVERA) 10 MG tablet Take 1 tablet (10 mg total) by mouth daily for 10 days 10 tablet 0   meloxicam (MOBIC) 15 MG tablet Take 1 tablet (15 mg total) by mouth daily. 30 tablet 0   mupirocin ointment (BACTROBAN) 2 % APPLY TO SKIN TWICE A DAY 22 g 1   NUVARING 0.12-0.015 MG/24HR vaginal ring INSERT 1 RING VAGINALLY ONCE EVERY 4 WEEKS; LEAVE IN FOR 3 WEEKS AND REMOVE FOR 1 WEEK AS DIRECTED BY PROVIDER 1 each 2   ondansetron (ZOFRAN) 4 MG tablet Take 1 tablet (4 mg total) by mouth every 8 (eight) hours as needed for nausea or vomiting. 20 tablet 1   predniSONE (STERAPRED UNI-PAK 21 TAB) 10 MG (21) TBPK tablet Take per package instructions. Do not skip doses. Finish entire supply. 1 each 0   tiZANidine (ZANAFLEX) 2 MG tablet TAKE 1 TABLET BY MOUTH AT BEDTIME 15 tablet 0   traZODone (DESYREL) 50 MG tablet TAKE 1/2 TO 1 TABLET BY MOUTH AT BEDTIME AS NEEDED FOR SLEEP. 30 tablet 3   benzonatate (TESSALON) 100 MG capsule Take 1 capsule (100 mg total) by mouth 2 (two) times daily as needed for cough. 20 capsule 0   No current facility-administered medications for this visit.    No Known Allergies  Social History   Socioeconomic History   Marital status: Single    Spouse name: n/a   Number of children: 0   Years of education: Not on file   Highest education level: High school graduate  Occupational History   Occupation: Lexicographer:  minor    Comment: [employer], mining at [employer] Use   Smoking status: Former    Types: Cigarettes    Quit date: 10/21/2017    Years since quitting: 3.4   Smokeless tobacco: Never  Vaping Use   Vaping Use: Never used  Substance and  Sexual Activity   Alcohol use: No    Alcohol/week: 0.0 standard drinks   Drug use: No   Sexual activity: Never  Other Topics Concern   Not on file  Social History Narrative   Lives her mom mostly. Parents are divorced.   Social Determinants of Health   Financial Resource Strain: Not on file  Food Insecurity: Not on file  Transportation Needs: Not on file  Physical Activity: Not on file  Stress: Not on file  Social Connections: Not on file  Intimate Partner Violence: Not on file    Review of Systems  Constitutional:  Positive for malaise/fatigue. Negative for chills, diaphoresis, fever and weight loss.  HENT: Negative.    Eyes: Negative.   Respiratory:  Positive for cough. Negative for hemoptysis, sputum production, shortness of breath and wheezing.   Cardiovascular: Negative.  Gastrointestinal: Negative.   Genitourinary: Negative.   Musculoskeletal: Negative.   Skin: Negative.   Neurological: Negative.   Endo/Heme/Allergies: Negative.   Psychiatric/Behavioral: Negative.    All other systems reviewed and are negative.  Objective   Vitals as reported by the patient: There were no vitals filed for this visit.  Zissy was seen today for cough.  Diagnoses and all orders for this visit:  Lower respiratory infection -     benzonatate (TESSALON) 100 MG capsule; Take 1 capsule (100 mg total) by mouth 2 (two) times daily as needed for cough. -     predniSONE (STERAPRED UNI-PAK 21 TAB) 10 MG (21) TBPK tablet; Take per package instructions. Do not skip doses. Finish entire supply. -     azithromycin (ZITHROMAX) 250 MG tablet; Take 2 tablets on day 1, then 1 tablet daily on days 2 through 5  Vaginal candida -     fluconazole (DIFLUCAN) 150 MG tablet; Take 1 tablet (150 mg total) by mouth once for 1 dose.   PLAN Feel this is likely viral - will treat with prednisone and tessalon, discussed supporive care Given her hx of bronchitis will call in anticipiatory abx. Discussed  with pt to hold on these until 7 day of symptoms. With abx use pt reports vaginal candida. Will give diflucan if concerns If symptoms persist or worsen, pt will call clinic Patient encouraged to call clinic with any questions, comments, or concerns.  I discussed the assessment and treatment plan with the patient. The patient was provided an opportunity to ask questions and all were answered. The patient agreed with the plan and demonstrated an understanding of the instructions.   The patient was advised to call back or seek an in-person evaluation if the symptoms worsen or if the condition fails to improve as anticipated.  I provided 16 minutes of face-to-face time during this encounter.  Maximiano Coss, NP

## 2021-03-24 ENCOUNTER — Other Ambulatory Visit (HOSPITAL_COMMUNITY): Payer: Self-pay

## 2021-03-24 DIAGNOSIS — Z6832 Body mass index (BMI) 32.0-32.9, adult: Secondary | ICD-10-CM | POA: Diagnosis not present

## 2021-03-24 DIAGNOSIS — Z01419 Encounter for gynecological examination (general) (routine) without abnormal findings: Secondary | ICD-10-CM | POA: Diagnosis not present

## 2021-03-24 DIAGNOSIS — N926 Irregular menstruation, unspecified: Secondary | ICD-10-CM | POA: Diagnosis not present

## 2021-03-24 MED ORDER — ETONOGESTREL-ETHINYL ESTRADIOL 0.12-0.015 MG/24HR VA RING
VAGINAL_RING | VAGINAL | 6 refills | Status: DC
  Filled 2021-03-24: qty 3, 84d supply, fill #0

## 2021-03-27 ENCOUNTER — Encounter: Payer: Self-pay | Admitting: Physical Therapy

## 2021-03-27 ENCOUNTER — Ambulatory Visit: Payer: 59 | Admitting: Physical Therapy

## 2021-03-27 ENCOUNTER — Encounter: Payer: Self-pay | Admitting: Family Medicine

## 2021-03-27 ENCOUNTER — Other Ambulatory Visit: Payer: Self-pay

## 2021-03-27 ENCOUNTER — Ambulatory Visit: Payer: 59 | Admitting: Family Medicine

## 2021-03-27 VITALS — BP 120/78 | HR 104 | Temp 98.2°F | Resp 17 | Ht 66.0 in | Wt 205.6 lb

## 2021-03-27 DIAGNOSIS — F411 Generalized anxiety disorder: Secondary | ICD-10-CM

## 2021-03-27 DIAGNOSIS — R2689 Other abnormalities of gait and mobility: Secondary | ICD-10-CM | POA: Diagnosis not present

## 2021-03-27 DIAGNOSIS — G8929 Other chronic pain: Secondary | ICD-10-CM

## 2021-03-27 DIAGNOSIS — Z23 Encounter for immunization: Secondary | ICD-10-CM | POA: Diagnosis not present

## 2021-03-27 DIAGNOSIS — M6281 Muscle weakness (generalized): Secondary | ICD-10-CM

## 2021-03-27 DIAGNOSIS — M25562 Pain in left knee: Secondary | ICD-10-CM | POA: Diagnosis not present

## 2021-03-27 DIAGNOSIS — F331 Major depressive disorder, recurrent, moderate: Secondary | ICD-10-CM | POA: Diagnosis not present

## 2021-03-27 NOTE — Therapy (Addendum)
[facility] [address] [place], [place], [address] Phone: [phone]   Fax:  [phone]  Physical Therapy Treatment  Patient Details  Name: Cheryl [name] MRN: [id number] Date of Birth: 01-16-2001 Referring Provider (PT): Charlann Boxer   Encounter Date: 03/27/2021   PT End of Session - 03/27/21 1340     Visit Number 6    Number of Visits 16    Date for PT Re-Evaluation 04/21/21    Authorization Type [facility]     recert at visit 5    PT Start Time 0936    PT Stop Time 1014    PT Time Calculation (min) 38 min    Activity Tolerance Patient tolerated treatment well    Behavior During Therapy [facility] for tasks assessed/performed             Past Medical History:  Diagnosis Date   Allergy    Anxiety    Depression    PCOS (polycystic ovarian syndrome) 11/2016    Past Surgical History:  Procedure Laterality Date   WISDOM TOOTH EXTRACTION     WISDOM TOOTH EXTRACTION      There were no vitals filed for this visit.   Subjective Assessment - 03/27/21 1339     Subjective Pt stats doing better with strengthening, she has been able to do some LE work at gym. She still has mild pain around knee cap with increased activity and exercise.    Currently in Pain? Yes    Pain Location Knee    Pain Orientation Left    Pain Descriptors / Indicators Aching    Pain Type Acute pain    Pain Onset More than a month ago    Pain Frequency Intermittent                               [facility] Adult PT Treatment/Exercise - 03/27/21 0001       Exercises   Exercises Knee/Hip      Knee/Hip Exercises: Aerobic   Recumbent Bike L2 x 49mn;      Knee/Hip Exercises: Plyometrics   Bilateral Jumping 20 reps    Bilateral Jumping Limitations small/ double leg hops,  x10 small squat jumps, x 15 larger squat jumps.      Knee/Hip Exercises: Standing   Lateral Step Up 15 reps;Left    Lateral Step Up Limitations 12 in step     Forward Step Up Both;15 reps   12 in step   Functional Squat 20 reps    Functional Squat Limitations 25 lb bar    Other Standing Knee Exercises SL RDL 10lb x 10 bil;  fwd/bwd jogging and lateral slides 20 ft x 6 each;    Other Standing Knee Exercises Walk/march 10 ft x 4, fwd and bwd.;  BOSU: squats x 20, lateral hops x 20;  flat side: weight shifts x 20;      Knee/Hip Exercises: Seated   Sit to Sand 15 reps   Single leg with ecc lowering.     Manual Therapy   Manual Therapy --    Kinesiotex --      Kinesiotix   Ligament Correction --                       PT Short Term Goals - 03/10/21 1354       PT SHORT TERM GOAL #1   Title Pt to  be independent with initial HEP    Time 2    Period Weeks    Status Achieved               PT Long Term Goals - 03/10/21 1356       PT LONG TERM GOAL #1   Title Pt to be independent with final HEP    Time 8    Period Weeks    Status Partially Met    Target Date 04/21/21      PT LONG TERM GOAL #2   Title Pt to demo full quad and hip strength bil , to improve stability, and ability for stairs, and dynamic activity.    Time 6    Period Weeks    Status On-going    Target Date 04/21/21      PT LONG TERM GOAL #3   Title Pt to demo [facility] of L knee to be WNL for pt age, with single leg and unstable surface, for safety with community and functional activity.    Time 8    Period Weeks    Status On-going    Target Date 04/21/21      PT LONG TERM GOAL #4   Title Pt to demo ability for squat, stairs, jogging, and direction changes without pain or apprehension in L knee.    Time 8    Period Weeks    Status On-going    Target Date 04/21/21      PT LONG TERM GOAL #5   Title Pt to report decreased pain in L knee to 0-2/10 with activity.    Time 8    Period Weeks    Status Partially Met    Target Date 04/21/21                   Plan - 03/27/21 1342     Clinical Impression Statement Pt with improved abiltiy for  strength and stability from previous visit. She is challenged with all activities today, due to weakness, instability, and general deconditioning. Progressed to higher level activities with jumping and squats on unstable surface today. Pt to benefit from continued strength and stability, discussed importance of continued strengthening. she is having soreness with increased activity. Discussed trying to tape knee prior to workouts and/or HEP, to see if that helps the pain.    Personal Factors and Comorbidities Past/Current Experience    Examination-Activity Limitations Stairs;Squat;Lift;Stand;Bend;Locomotion Level    Examination-Participation Restrictions Cleaning;Meal Prep;Yard Work;Community Activity;Laundry;Shop    Stability/Clinical Decision Making Stable/Uncomplicated    Rehab Potential Good    PT Frequency 2x / week    PT Duration 8 weeks    PT Treatment/Interventions ADLs/Self Care Home Management;Cryotherapy;Electrical Stimulation;Iontophoresis 82m/ml Dexamethasone;Moist Heat;Traction;Ultrasound;DME Instruction;Balance training;Therapeutic exercise;Therapeutic activities;Functional mobility training;Stair training;Gait training;Neuromuscular re-education;Patient/family education;Manual techniques;Vasopneumatic Device;Taping;Dry needling;Passive range of motion;Spinal Manipulations;Joint Manipulations    PT Home Exercise Plan [id number]    Consulted and Agree with Plan of Care Patient             Patient will benefit from skilled therapeutic intervention in order to improve the following deficits and impairments:  Abnormal gait, Pain, Decreased coordination, Decreased mobility, Increased muscle spasms, Decreased strength, Decreased range of motion, Decreased activity tolerance, Decreased balance, Difficulty walking  Visit Diagnosis: Chronic pain of left knee  Muscle weakness (generalized)  Other abnormalities of gait and mobility     Problem List Patient Active Problem List    Diagnosis Date Noted   Obesity (BMI 30-39.9) 11/30/2020  ADHD (attention deficit hyperactivity disorder), 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   Neck muscle spasm 12/03/2019   Patellar subluxation, left, initial encounter 11/14/2019   SI (sacroiliac) joint dysfunction 07/14/2019   Nonallopathic lesion of sacral region 07/14/2019   Nonallopathic lesion of lumbosacral region 07/14/2019   Nonallopathic lesion of thoracic region 07/14/2019   Medial tibial stress syndrome, right, initial encounter 07/14/2019   Palpitations 04/13/2019   PCOS (polycystic ovarian syndrome) 11/16/2016   Physical exam 11/15/2016   Amenorrhea 11/15/2016   Weight gain 11/15/2016   GAD (generalized anxiety disorder) 03/22/2015   MDD (major depressive disorder), recurrent episode, moderate ([place]) 03/17/2015    Lyndee Hensen, PT, DPT 1:47 PM  03/27/21    [facility] [place], [place], [address] Phone: [phone]   Fax:  [phone]  Name: Cheryl [name] MRN: [id number] Date of Birth: 12-Mar-2001  PHYSICAL THERAPY DISCHARGE SUMMARY  Visits from Start of Care: 6 Plan: Patient agrees to discharge.  Patient goals were partially met. Patient is being discharged due to - not returning since last visit.     Lyndee Hensen, PT, DPT 12:07 PM  06/21/21

## 2021-03-27 NOTE — Progress Notes (Signed)
   Subjective:    Patient ID: Cheryl [name], female    DOB: [birth year], 20 y.o.   MRN: [id number]  HPI Anxiety and depression- ongoing issue for pt, currently on Wellbutrin 150mg  daily, Fluoxetine 40mg  daily, Trazodone 50mg  QHS, and alprazolam prn.  Pt feels that restarting Trazodone has been helpful.  Still in counseling but has not been able to get in since school started.  Has appt upcoming.  Pt reports she will sometimes have physical sxs before she even realizes she's anxious.  Pt has had increased anxiety w/ upcoming holidays and gets very anxious when talking about the future or her dad's estate.  Pt reports she has 'numbed out' to a lot.  This is helping her get through school.  'in survival mode' and trying to distract herself.  Pt feels the Alprazolam is helpful.  Pt just recently started 40mg  Prozac   Review of Systems For ROS see HPI   This visit occurred during the SARS-CoV-2 public health emergency.  Safety protocols were in place, including screening questions prior to the visit, additional usage of staff PPE, and extensive cleaning of exam room while observing appropriate contact time as indicated for disinfecting solutions.      Objective:   Physical Exam Vitals reviewed.  Constitutional:      General: She is not in acute distress.    Appearance: Normal appearance. She is not ill-appearing.  HENT:     Head: Normocephalic and atraumatic.  Skin:    General: Skin is warm and dry.  Neurological:     General: No focal deficit present.     Mental Status: She is alert and oriented to person, place, and time.  Psychiatric:        Mood and Affect: Mood normal.        Behavior: Behavior normal.        Thought Content: Thought content normal.          Assessment & Plan:

## 2021-03-27 NOTE — Assessment & Plan Note (Signed)
Improved.  Pt states she will have physical sxs prior to having emotional sxs and that sometimes she doesn't even recognize she's anxious.  She feels that things are going better w/ her improved sleep.  She is not interested in med changes at this time b/c she doesn't want to make changes during the semester- which is understandable.  Will continue to follow.

## 2021-03-27 NOTE — Assessment & Plan Note (Signed)
Stable at this time.  Improved from previous visits but she is aware that the upcoming months will be hard with the holidays.  She has an appt upcoming w/ her counselor and I encouraged her to continue.  No med changes at this time due to school.  Will follow.

## 2021-03-27 NOTE — Patient Instructions (Signed)
Follow up as needed or based on mood No need for blood work today- yay!!! No med changes at this time but we can always revisit that as needed Continue to work through things in counseling Call with any questions or concerns GOOD LUCK with the semester!!! I'm PROUD of you!!!

## 2021-04-05 NOTE — Progress Notes (Deleted)
  [facility] [place] Phone: [phone] Subjective:    I'm seeing this patient by the request  of:  Midge Minium, MD  CC:   [id number]  Cheryl [name] is a 20 y.o. female coming in with complaint of back and neck pain. OMT 02/21/2021. Patient states   Medications patient has been prescribed: Zanaflex, Meloxicam  Taking:         Reviewed prior external information including notes and imaging from previsou exam, outside providers and external EMR if available.   As well as notes that were available from care everywhere and other healthcare systems.  Past medical history, social, surgical and family history all reviewed in electronic medical record.  No pertanent information unless stated regarding to the chief complaint.   Past Medical History:  Diagnosis Date   Allergy    Anxiety    Depression    PCOS (polycystic ovarian syndrome) 11/2016    No Known Allergies   Review of Systems:  No headache, visual changes, nausea, vomiting, diarrhea, constipation, dizziness, abdominal pain, skin rash, fevers, chills, night sweats, weight loss, swollen lymph nodes, body aches, joint swelling, chest pain, shortness of breath, mood changes. POSITIVE muscle aches  Objective  There were no vitals taken for this visit.   General: No apparent distress alert and oriented x3 mood and affect normal, dressed appropriately.  HEENT: Pupils equal, extraocular movements intact  Respiratory: Patient's speak in full sentences and does not appear short of breath  Cardiovascular: No lower extremity edema, non tender, no erythema  Neuro: Cranial nerves II through XII are intact, neurovascularly intact in all extremities with 2+ DTRs and 2+ pulses.  Gait normal with good balance and coordination.  MSK:  Non tender with full range of motion and good stability and symmetric strength and tone of shoulders, elbows, wrist, hip, knee  and ankles bilaterally.  Back - Normal skin, Spine with normal alignment and no deformity.  No tenderness to vertebral process palpation.  Paraspinous muscles are not tender and without spasm.   Range of motion is full at neck and lumbar sacral regions  Osteopathic findings  C2 flexed rotated and side bent right C6 flexed rotated and side bent left T3 extended rotated and side bent right inhaled rib T9 extended rotated and side bent left L2 flexed rotated and side bent right Sacrum right on right       Assessment and Plan:    Nonallopathic problems  Decision today to treat with OMT was based on Physical Exam  After verbal consent patient was treated with HVLA, ME, FPR techniques in cervical, rib, thoracic, lumbar, and sacral  areas  Patient tolerated the procedure well with improvement in symptoms  Patient given exercises, stretches and lifestyle modifications  See medications in patient instructions if given  Patient will follow up in 4-8 weeks      The above documentation has been reviewed and is accurate and complete Cheryl [name]       Note: This dictation was prepared with Dragon dictation along with smaller phrase technology. Any transcriptional errors that result from this process are unintentional.

## 2021-04-07 ENCOUNTER — Ambulatory Visit: Payer: 59 | Admitting: Family Medicine

## 2021-04-14 ENCOUNTER — Encounter: Payer: 59 | Admitting: Physical Therapy

## 2021-04-18 ENCOUNTER — Ambulatory Visit (INDEPENDENT_AMBULATORY_CARE_PROVIDER_SITE_OTHER): Payer: 59 | Admitting: Sports Medicine

## 2021-04-18 ENCOUNTER — Other Ambulatory Visit: Payer: Self-pay

## 2021-04-18 ENCOUNTER — Other Ambulatory Visit (HOSPITAL_COMMUNITY): Payer: Self-pay

## 2021-04-18 VITALS — BP 140/90 | HR 121 | Ht 66.0 in | Wt 208.0 lb

## 2021-04-18 DIAGNOSIS — M9903 Segmental and somatic dysfunction of lumbar region: Secondary | ICD-10-CM | POA: Diagnosis not present

## 2021-04-18 DIAGNOSIS — M9902 Segmental and somatic dysfunction of thoracic region: Secondary | ICD-10-CM | POA: Diagnosis not present

## 2021-04-18 DIAGNOSIS — M9908 Segmental and somatic dysfunction of rib cage: Secondary | ICD-10-CM | POA: Diagnosis not present

## 2021-04-18 DIAGNOSIS — R519 Headache, unspecified: Secondary | ICD-10-CM

## 2021-04-18 DIAGNOSIS — M9901 Segmental and somatic dysfunction of cervical region: Secondary | ICD-10-CM | POA: Diagnosis not present

## 2021-04-18 DIAGNOSIS — M9905 Segmental and somatic dysfunction of pelvic region: Secondary | ICD-10-CM | POA: Diagnosis not present

## 2021-04-18 MED ORDER — TIZANIDINE HCL 2 MG PO TABS
ORAL_TABLET | Freq: Every day | ORAL | 0 refills | Status: DC
  Filled 2021-04-18: qty 15, 15d supply, fill #0

## 2021-04-18 NOTE — Progress Notes (Signed)
Cheryl [name] Cheryl [name] [place] Phone: [phone]   Assessment and Plan:     1. Occipital headache 2. Somatic dysfunction of cervical region 3. Somatic dysfunction of thoracic region 4. Somatic dysfunction of lumbar region 5. Somatic dysfunction of pelvic region 6. Somatic dysfunction of rib region -Chronic with exacerbation, subsequent sports medicine - Recurrence of multiple chronic musculoskeletal complaints with worst being tension of neck leading to an occipital headache.  She has received significant benefit from OMT in the past and elected for repeat OMT today.  Tolerated well per note below   Decision today to treat with OMT was based on Physical Exam   After verbal consent patient was treated with HVLA (high velocity low amplitude), ME (muscle energy), FPR (flex positional release), ST (soft tissue), PC/PD (Pelvic Compression/ Pelvic Decompression) techniques in cervical, rib, thoracic, lumbar, and pelvic areas. Patient tolerated the procedure well with improvement in symptoms.  Patient educated on potential side effects of soreness and recommended to rest, hydrate, and use Tylenol as needed for pain control.   Pertinent previous records reviewed include none   Follow Up: 4 weeks for follow-up OMT visit   Subjective:   I, Judy Pimple, am serving as a scribe for Dr. Glennon Mac  Chief Complaint: back and neck pain   HPI:   04/18/21 Patient is a 20 year old female presenting with low back pain. Patient was last seen by Dr. Tamala Julian on 02/23/21 and had OMT. Today patient states that she has been having really bad since she has gone a little while longer than she is used to between [employer]. States that pain is mainly up in the neck today.   Relevant Historical Information: None pertinent   Additional pertinent review of systems negative.  Current Outpatient Medications  Medication Sig Dispense Refill    albuterol (VENTOLIN HFA) 108 (90 Base) MCG/ACT inhaler INHALE 2 PUFFS INTO THE LUNGS EVERY 6 (SIX) HOURS AS NEEDED FOR WHEEZING OR SHORTNESS OF BREATH. 18 g 0   ALPRAZolam (XANAX) 0.5 MG tablet TAKE 1 TABLET BY MOUTH 2 TIMES DAILY AS NEEDED FOR ANXIETY. 30 tablet 1   benzonatate (TESSALON) 100 MG capsule Take 1 capsule by mouth 2 (two) times daily as needed for cough. 20 capsule 0   buPROPion (WELLBUTRIN XL) 150 MG 24 hr tablet Take 1 tablet (150 mg total) by mouth daily. 30 tablet 3   cycloSPORINE (RESTASIS) 0.05 % ophthalmic emulsion Instill 1 drop into both eyes twice a day 180 each 3   EPINEPHrine 0.3 mg/0.3 mL IJ SOAJ injection   1   etonogestrel-ethinyl estradiol (NUVARING) 0.12-0.015 MG/24HR vaginal ring INSERT 1 RING VAGINALLY ONCE EVERY 4 WEEKS; LEAVE IN FOR 3 WEEKS AND REMOVE FOR 1 WEEK AS DIRECTED BY PROVIDER 3 each 6   fexofenadine-pseudoephedrine (ALLEGRA-D ALLERGY & CONGESTION) 180-240 MG 24 hr tablet Take 1 tablet by mouth daily. 20 tablet 0   FLUoxetine (PROZAC) 40 MG capsule TAKE 1 CAPSULE BY MOUTH ONCE A DAY 90 capsule 1   fluticasone (FLONASE) 50 MCG/ACT nasal spray INSTILL 1 TO 2 SPRAYS INTO BOTH NOSTRILS DAILY 16 g 0   loratadine-pseudoephedrine (CLARITIN-D 24 HOUR) 10-240 MG 24 hr tablet Take 1 tablet by mouth daily. 30 tablet 0   meloxicam (MOBIC) 15 MG tablet Take 1 tablet (15 mg total) by mouth daily. 30 tablet 0   mupirocin ointment (BACTROBAN) 2 % APPLY TO SKIN TWICE A DAY 22 g 1  ondansetron (ZOFRAN) 4 MG tablet Take 1 tablet (4 mg total) by mouth every 8 (eight) hours as needed for nausea or vomiting. 20 tablet 1   traZODone (DESYREL) 50 MG tablet TAKE 1/2 TO 1 TABLET BY MOUTH AT BEDTIME AS NEEDED FOR SLEEP. 30 tablet 3   tiZANidine (ZANAFLEX) 2 MG tablet TAKE 1 TABLET BY MOUTH AT BEDTIME 15 tablet 0   No current facility-administered medications for this visit.      Objective:     Vitals:   04/18/21 1500  BP: 140/90  Pulse: (!) 121  SpO2: 97%  Weight: 208 lb  (94.3 kg)  Height: 5\' 6"  (1.676 m)      Body mass index is 33.57 kg/m.    Physical Exam:     General: Well-appearing, cooperative, sitting comfortably in no acute distress.   OMT Physical Exam:  ASIS Compression Test: Positive Right Cervical: TTP paraspinal, C3 RRSL Rib: Bilateral elevated first rib with TTP Thoracic: TTP paraspinal, T4-10 RRSL Lumbar: TTP paraspinal, L1-3 RRSL Pelvis: Right anterior innominate  Electronically signed by:  Cheryl [name] D.[facility] 4:19 PM 04/18/21

## 2021-04-18 NOTE — Patient Instructions (Addendum)
Good to see you  Refill of Zanaflex  Follow up in 4-6 weeks for repeat OMT

## 2021-04-21 ENCOUNTER — Other Ambulatory Visit: Payer: Self-pay | Admitting: Family Medicine

## 2021-04-21 ENCOUNTER — Other Ambulatory Visit (HOSPITAL_COMMUNITY): Payer: Self-pay

## 2021-04-21 MED ORDER — BUPROPION HCL ER (XL) 150 MG PO TB24
150.0000 mg | ORAL_TABLET | Freq: Every day | ORAL | 3 refills | Status: DC
  Filled 2021-04-21: qty 30, 30d supply, fill #0
  Filled 2021-05-16: qty 30, 30d supply, fill #1
  Filled 2021-05-30: qty 30, 30d supply, fill #2

## 2021-04-25 ENCOUNTER — Other Ambulatory Visit: Payer: Self-pay

## 2021-04-25 ENCOUNTER — Ambulatory Visit: Payer: 59 | Admitting: Registered Nurse

## 2021-04-25 ENCOUNTER — Other Ambulatory Visit (HOSPITAL_COMMUNITY): Payer: Self-pay

## 2021-04-25 ENCOUNTER — Encounter: Payer: Self-pay | Admitting: Registered Nurse

## 2021-04-25 VITALS — BP 110/65 | HR 88 | Temp 98.2°F | Resp 18 | Ht 66.0 in | Wt 209.0 lb

## 2021-04-25 DIAGNOSIS — J029 Acute pharyngitis, unspecified: Secondary | ICD-10-CM | POA: Diagnosis not present

## 2021-04-25 MED ORDER — AMOXICILLIN-POT CLAVULANATE 875-125 MG PO TABS
1.0000 | ORAL_TABLET | Freq: Two times a day (BID) | ORAL | 0 refills | Status: DC
  Filled 2021-04-25: qty 14, 7d supply, fill #0

## 2021-04-25 NOTE — Progress Notes (Signed)
Acute Office Visit  Subjective:    Patient ID: Cheryl [name], female    DOB: January 21, 2001, 20 y.o.   MRN: [id number]  Chief Complaint  Patient presents with   Sore Throat    Patient states she was sick last month and is still having some throat pain on the right side. Patient states it is hard to swallow and talk.    HPI Patient is in today for ongoing illness.  Seen by me on 03/21/21 for lower respiratory illness. Given prednisone and tessalon for tx. Was supplied with z pack in case of persistent symptoms. Symptoms improved, did not take z pack.  Ongoing sore throat, R side, tonsillar and under jaw Pain with swallowing. No dysphagia or interuption to breathing. No fevers. Question of chills/fatigue.  Regularly sees dentist - no know deficits in dental health.   Has been more nauseated lately - thinks it may be related to poor po intake related to pain in throat.   Past Medical History:  Diagnosis Date   Allergy    Anxiety    Depression    PCOS (polycystic ovarian syndrome) 11/2016    Past Surgical History:  Procedure Laterality Date   WISDOM TOOTH EXTRACTION     WISDOM TOOTH EXTRACTION      Family History  Problem Relation Age of Onset   Depression Mother    Anxiety disorder Mother    Hyperlipidemia Mother    Hypertension Mother    Hypertension Father    Depression Maternal Uncle    Hypertension Maternal Grandmother    Hypertension Maternal Grandfather    Drug abuse Maternal Grandfather     Social History   Socioeconomic History   Marital status: Single    Spouse name: n/a   Number of children: 0   Years of education: Not on file   Highest education level: High school graduate  Occupational History   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 Parents are divorced.   Social Determinants of Health   Financial Resource Strain: Not on file  Food Insecurity: Not on file  Transportation Needs: Not on file  Physical Activity: Not on file  Stress: Not on file  Social Connections: Not on file  Intimate Partner Violence: Not on file    Outpatient Medications Prior to Visit  Medication Sig Dispense Refill   albuterol (VENTOLIN HFA) 108 (90 Base) MCG/ACT inhaler INHALE 2 PUFFS INTO THE LUNGS EVERY 6 (SIX) HOURS AS NEEDED FOR WHEEZING OR SHORTNESS OF BREATH. 18 g 0   ALPRAZolam (XANAX) 0.5 MG tablet TAKE 1 TABLET BY MOUTH 2 TIMES DAILY AS NEEDED FOR ANXIETY. 30 tablet 1   benzonatate (TESSALON) 100 MG capsule Take 1 capsule by mouth 2 (two) times daily as needed for cough. 20 capsule 0   buPROPion (WELLBUTRIN XL) 150 MG 24 hr tablet Take 1 tablet (150 mg total) by mouth daily. 30 tablet 3  cycloSPORINE (RESTASIS) 0.05 % ophthalmic emulsion Instill 1 drop into both eyes twice a day 180 each 3   EPINEPHrine 0.3 mg/0.3 mL IJ SOAJ injection   1   etonogestrel-ethinyl estradiol (NUVARING) 0.12-0.015 MG/24HR vaginal ring INSERT 1 RING VAGINALLY ONCE EVERY 4 WEEKS; LEAVE IN FOR 3 WEEKS AND REMOVE FOR 1 WEEK AS DIRECTED BY PROVIDER 3 each 6   fexofenadine-pseudoephedrine (ALLEGRA-D ALLERGY & CONGESTION) 180-240 MG 24 hr tablet Take 1 tablet by mouth daily. 20 tablet 0   FLUoxetine (PROZAC) 40 MG capsule TAKE 1 CAPSULE BY MOUTH ONCE A DAY 90 capsule 1   fluticasone (FLONASE) 50 MCG/ACT nasal spray INSTILL 1 TO 2 SPRAYS INTO BOTH NOSTRILS DAILY 16 g 0   loratadine-pseudoephedrine (CLARITIN-D 24 HOUR) 10-240 MG 24 hr tablet Take 1 tablet by mouth daily. 30 tablet 0   meloxicam (MOBIC) 15 MG tablet Take 1 tablet  (15 mg total) by mouth daily. 30 tablet 0   mupirocin ointment (BACTROBAN) 2 % APPLY TO SKIN TWICE A DAY 22 g 1   ondansetron (ZOFRAN) 4 MG tablet Take 1 tablet (4 mg total) by mouth every 8 (eight) hours as needed for nausea or vomiting. 20 tablet 1   tiZANidine (ZANAFLEX) 2 MG tablet TAKE 1 TABLET BY MOUTH AT BEDTIME 15 tablet 0   traZODone (DESYREL) 50 MG tablet TAKE 1/2 TO 1 TABLET BY MOUTH AT BEDTIME AS NEEDED FOR SLEEP. 30 tablet 3   No facility-administered medications prior to visit.    No Known Allergies  Review of Systems  Constitutional: Negative.   HENT:  Positive for sore throat and trouble swallowing.   Eyes: Negative.   Respiratory: Negative.    Cardiovascular: Negative.   Gastrointestinal: Negative.   Genitourinary: Negative.   Musculoskeletal: Negative.   Skin: Negative.   Neurological: Negative.   Psychiatric/Behavioral: Negative.    All other systems reviewed and are negative.     Objective:    Physical Exam Vitals and nursing note reviewed.  Constitutional:      General: She is not in acute distress.    Appearance: Normal appearance. She is not ill-appearing, toxic-appearing or diaphoretic.  HENT:     Head: Normocephalic and atraumatic.     Right Ear: Tympanic membrane and ear canal normal. No drainage or swelling.     Left Ear: Tympanic membrane and ear canal normal. No drainage or swelling.     Mouth/Throat:     Tonsils: No tonsillar exudate or tonsillar abscesses. 2+ on the right. 2+ on the left.  Cardiovascular:     Rate and Rhythm: Normal rate and regular rhythm.     Pulses: Normal pulses.     Heart sounds: Normal heart sounds. No murmur heard.   No friction rub. No gallop.  Pulmonary:     Effort: Pulmonary effort is normal. No respiratory distress.     Breath sounds: Normal breath sounds. No stridor. No wheezing, rhonchi or rales.  Chest:     Chest wall: No tenderness.  Lymphadenopathy:     Cervical: Cervical adenopathy (R tonsillar)  present.  Skin:    General: Skin is warm and dry.     Capillary Refill: Capillary refill takes less than 2 seconds.  Neurological:     General: No focal deficit present.     Mental Status: She is alert and oriented to person, place, and time. Mental status is at baseline.  Psychiatric:        Mood and Affect: Mood normal.  Behavior: Behavior normal.        Thought Content: Thought content normal.        Judgment: Judgment normal.    BP 110/65   Pulse 88   Temp 98.2 F (36.8 C) (Temporal)   Resp 18   Ht 5\' 6"  (1.676 m)   Wt 209 lb (94.8 kg)   SpO2 100%   BMI 33.73 kg/m  Wt Readings from Last 3 Encounters:  04/25/21 209 lb (94.8 kg)  04/18/21 208 lb (94.3 kg)  03/27/21 205 lb 9.6 oz (93.3 kg)    Health Maintenance Due  Topic Date Due   HPV VACCINES (1 - 2-dose series) Never done   COVID-19 Vaccine (3 - Booster for Pfizer series) 11/07/2019   PAP SMEAR-Modifier  Never done       Topic Date Due   HPV VACCINES (1 - 2-dose series) Never done     Lab Results  Component Value Date   TSH 2.13 12/08/2020   Lab Results  Component Value Date   WBC 6.7 12/08/2020   HGB 14.4 12/08/2020   HCT 43.3 12/08/2020   MCV 83.2 12/08/2020   PLT 258.0 12/08/2020   Lab Results  Component Value Date   NA 139 12/08/2020   K 4.3 12/08/2020   CO2 28 12/08/2020   GLUCOSE 86 12/08/2020   BUN 14 12/08/2020   CREATININE 0.78 12/08/2020   BILITOT 0.3 12/08/2020   ALKPHOS 67 12/08/2020   AST 16 12/08/2020   ALT 16 12/08/2020   PROT 7.6 12/08/2020   ALBUMIN 4.3 12/08/2020   CALCIUM 9.2 12/08/2020   GFR 108.25 12/08/2020   Lab Results  Component Value Date   CHOL 172 12/08/2020   Lab Results  Component Value Date   HDL 46.60 12/08/2020   Lab Results  Component Value Date   LDLCALC 109 (H) 12/08/2020   Lab Results  Component Value Date   TRIG 84.0 12/08/2020   Lab Results  Component Value Date   CHOLHDL 4 12/08/2020   Lab Results  Component Value Date    HGBA1C 5.3 11/11/2019       Assessment & Plan:   Problem List Items Addressed This Visit   None Visit Diagnoses     Throat infection    -  Primary   Relevant Medications   amoxicillin-clavulanate (AUGMENTIN) 875-125 MG tablet        Meds ordered this encounter  Medications   amoxicillin-clavulanate (AUGMENTIN) 875-125 MG tablet    Sig: Take 1 tablet by mouth 2 (two) times daily.    Dispense:  14 tablet    Refill:  0    Order Specific Question:   Supervising Provider    Answer:   Carlota Raspberry, JEFFREY R [[id number]]   PLAN Swollen tonsils and adenopathy. Will tx with augmentin as above. Recommend chloraseptic spray and tylenol for symptom relief. Return precautions reviewed Patient encouraged to call clinic with any questions, comments, or concerns.   Maximiano Coss, NP

## 2021-04-25 NOTE — Patient Instructions (Addendum)
Ms. Yancy Hascall to see you  Augmentin twice daily for a week Chloraseptic spray for relief, Can use tylenol 1000mg  three times daily as needed for relief.  Let me know if symptoms get worse or change  Thank you  Rich     If you have lab work done today you will be contacted with your lab results within the next 2 weeks.  If you have not heard from [place] then please contact us. The fastest way to get your results is to register for My Chart.   IF you received an x-ray today, you will receive an invoice from [facility]. Please contact [facility] at [phone] with questions or concerns regarding your invoice.   IF you received labwork today, you will receive an invoice from [facility]. Please contact [facility] at [phone] with questions or concerns regarding your invoice.   Our billing staff will not be able to assist you with questions regarding bills from these companies.  You will be contacted with the lab results as soon as they are available. The fastest way to get your results is to activate your My Chart account. Instructions are located on the last page of this paperwork. If you have not heard from [place] regarding the results in 2 weeks, please contact this office.

## 2021-05-03 ENCOUNTER — Ambulatory Visit: Payer: 59 | Admitting: Family Medicine

## 2021-05-08 ENCOUNTER — Other Ambulatory Visit (HOSPITAL_COMMUNITY): Payer: Self-pay

## 2021-05-08 DIAGNOSIS — Z6833 Body mass index (BMI) 33.0-33.9, adult: Secondary | ICD-10-CM | POA: Diagnosis not present

## 2021-05-08 DIAGNOSIS — E669 Obesity, unspecified: Secondary | ICD-10-CM | POA: Diagnosis not present

## 2021-05-08 MED ORDER — PHENTERMINE HCL 37.5 MG PO TABS
ORAL_TABLET | ORAL | 0 refills | Status: DC
  Filled 2021-05-08: qty 30, 30d supply, fill #0

## 2021-05-16 ENCOUNTER — Other Ambulatory Visit (HOSPITAL_COMMUNITY): Payer: Self-pay

## 2021-05-29 NOTE — Progress Notes (Signed)
[place] Phone: [phone] Subjective:   Cheryl [name], am serving as a scribe for Dr. Hulan Saas.  This visit occurred during the SARS-CoV-2 public health emergency.  Safety protocols were in place, including screening questions prior to the visit, additional usage of staff PPE, and extensive cleaning of exam room while observing appropriate contact time as indicated for disinfecting solutions.    I'm seeing this patient by the request  of:  Midge Minium, MD  CC: back and neck pain follow up   [id number]  Cheryl [name] is a 20 y.o. female coming in with complaint of back and neck pain Patient states that her upper back ha been bothering her. [name] new injury.  Has recently had a cough for the last week.  Medications patient has been prescribed: Zanaflex   Taking: Yes         Reviewed prior external information including notes and imaging from previsou exam, outside providers and external EMR if available.   As well as notes that were available from care everywhere and other healthcare systems.  Past medical history, social, surgical and family history all reviewed in electronic medical record.  [name] pertanent information unless stated regarding to the chief complaint.   Past Medical History:  Diagnosis Date   Allergy    Anxiety    Depression    PCOS (polycystic ovarian syndrome) 11/2016    [name] Known Allergies   Review of Systems:  [name] headache, visual changes, nausea, vomiting, diarrhea, constipation, dizziness, abdominal pain, skin rash, fevers, chills, night sweats, weight loss, swollen lymph nodes, body aches, joint swelling, chest pain, shortness of breath, mood changes. POSITIVE muscle aches  Objective  Blood pressure 114/74, pulse (!) 113, height 5\' 6"  (1.676 m), weight 202 lb (91.6 kg), SpO2 97 %.   General: [name],  dressed appropriately.  HEENT: Pupils equal, extraocular movements intact  Respiratory: Patient's speak in full sentences and does not appear short of breath  Cardiovascular: [name], non tender, [name]  Gait normal with good balance and coordination.  MSK:  Non tender with full range of motion and good stability and symmetric strength and tone of shoulders, elbows, wrist, hip, knee and ankles bilaterally.  Back -back exam-[name] significant tenderness noted in the parascapular region of the upper parascapular region.  Patient has [name] midline tenderness.  Tightness of the neck with sidebending bilaterally.  Osteopathic findings  C4 flexed rotated and side bent right C6 flexed rotated and side bent left T3 extended rotated and side bent right inhaled rib T5 extended rotated and side bent left inhaled. L2 flexed rotated and side bent right Sacrum right on right       Assessment and Plan:  Neck muscle spasm Neck exam does have some mild loss of lordosis.  Some tenderness to palpation in the paraspinal musculature.  Tightness of the lower back also noted today.  Patient does have the muscle relaxer that I think can be beneficial when needed.  Does have meloxicam for breakthrough pain as well.  Follow-up again in 6 to 8 weeks   Nonallopathic problems  Decision today to treat with OMT was based on Physical Exam  After verbal consent patient was treated with HVLA, ME, FPR techniques in cervical, rib, thoracic, lumbar, and sacral  areas  Patient tolerated the procedure well with improvement in symptoms  Patient given  exercises, stretches and lifestyle modifications  See medications in patient instructions if given  Patient will follow up in 4-8 weeks      The above documentation has been reviewed and is accurate and complete [name], DO        Note: This dictation was prepared with Dragon dictation along with smaller phrase technology. Any  transcriptional errors that result from this process are unintentional.

## 2021-05-30 ENCOUNTER — Encounter: Payer: Self-pay | Admitting: Family Medicine

## 2021-05-30 ENCOUNTER — Other Ambulatory Visit: Payer: Self-pay

## 2021-05-30 ENCOUNTER — Other Ambulatory Visit (HOSPITAL_COMMUNITY): Payer: Self-pay

## 2021-05-30 ENCOUNTER — Ambulatory Visit (INDEPENDENT_AMBULATORY_CARE_PROVIDER_SITE_OTHER): Payer: 59 | Admitting: Family Medicine

## 2021-05-30 ENCOUNTER — Other Ambulatory Visit: Payer: Self-pay | Admitting: Sports Medicine

## 2021-05-30 ENCOUNTER — Other Ambulatory Visit: Payer: Self-pay | Admitting: Family Medicine

## 2021-05-30 VITALS — BP 114/74 | HR 113 | Ht 66.0 in | Wt 202.0 lb

## 2021-05-30 DIAGNOSIS — M9902 Segmental and somatic dysfunction of thoracic region: Secondary | ICD-10-CM

## 2021-05-30 DIAGNOSIS — M62838 Other muscle spasm: Secondary | ICD-10-CM

## 2021-05-30 DIAGNOSIS — M9908 Segmental and somatic dysfunction of rib cage: Secondary | ICD-10-CM | POA: Diagnosis not present

## 2021-05-30 DIAGNOSIS — M9904 Segmental and somatic dysfunction of sacral region: Secondary | ICD-10-CM

## 2021-05-30 DIAGNOSIS — M9901 Segmental and somatic dysfunction of cervical region: Secondary | ICD-10-CM | POA: Diagnosis not present

## 2021-05-30 DIAGNOSIS — M9903 Segmental and somatic dysfunction of lumbar region: Secondary | ICD-10-CM

## 2021-05-30 MED FILL — Fluoxetine HCl Cap 40 MG: ORAL | 60 days supply | Qty: 60 | Fill #1 | Status: AC

## 2021-05-30 NOTE — Assessment & Plan Note (Signed)
Neck exam does have some mild loss of lordosis.  Some tenderness to palpation in the paraspinal musculature.  Tightness of the lower back also noted today.  Patient does have the muscle relaxer that I think can be beneficial when needed.  Does have meloxicam for breakthrough pain as well.  Follow-up again in 6 to 8 weeks

## 2021-05-30 NOTE — Patient Instructions (Signed)
Great to see you Enjoy traveling See me in 5-6 weeks

## 2021-05-31 ENCOUNTER — Other Ambulatory Visit (HOSPITAL_COMMUNITY): Payer: Self-pay

## 2021-05-31 MED ORDER — TRAZODONE HCL 50 MG PO TABS
ORAL_TABLET | Freq: Every evening | ORAL | 3 refills | Status: DC | PRN
  Filled 2021-05-31: qty 30, 30d supply, fill #0
  Filled 2021-07-19: qty 30, 30d supply, fill #1
  Filled 2021-08-31: qty 30, 30d supply, fill #2
  Filled 2021-10-09: qty 30, 30d supply, fill #3

## 2021-05-31 MED ORDER — ALPRAZOLAM 0.5 MG PO TABS
ORAL_TABLET | Freq: Two times a day (BID) | ORAL | 1 refills | Status: AC | PRN
  Filled 2021-05-31: qty 30, 15d supply, fill #0
  Filled 2021-08-31: qty 30, 15d supply, fill #1

## 2021-06-01 ENCOUNTER — Other Ambulatory Visit (HOSPITAL_COMMUNITY): Payer: Self-pay

## 2021-06-05 ENCOUNTER — Other Ambulatory Visit (HOSPITAL_COMMUNITY): Payer: Self-pay

## 2021-06-05 DIAGNOSIS — E669 Obesity, unspecified: Secondary | ICD-10-CM | POA: Diagnosis not present

## 2021-06-05 DIAGNOSIS — Z6831 Body mass index (BMI) 31.0-31.9, adult: Secondary | ICD-10-CM | POA: Diagnosis not present

## 2021-06-05 MED ORDER — PHENTERMINE HCL 37.5 MG PO TABS
ORAL_TABLET | ORAL | 2 refills | Status: DC
  Filled 2021-06-05: qty 30, 30d supply, fill #0
  Filled 2021-07-19: qty 30, 30d supply, fill #1
  Filled 2021-08-31: qty 30, 30d supply, fill #2

## 2021-06-08 ENCOUNTER — Other Ambulatory Visit (HOSPITAL_COMMUNITY): Payer: Self-pay

## 2021-06-09 ENCOUNTER — Other Ambulatory Visit (HOSPITAL_COMMUNITY): Payer: Self-pay

## 2021-06-29 NOTE — Progress Notes (Signed)
Cheryl [name] [place] [address] [place] Phone: [phone] Subjective:   IVilma [name], am serving as a scribe for Dr. Hulan Saas. This visit occurred during the SARS-CoV-2 public health emergency.  Safety protocols were in place, including screening questions prior to the visit, additional usage of staff PPE, and extensive cleaning of exam room while observing appropriate contact time as indicated for disinfecting solutions.   I'm seeing this patient by the request  of:  Midge Minium, MD  CC: Neck and back pain and neck pain follow-up  [id number]  Cheryl [name] [place] is a 21 y.o. female coming in with complaint of back and neck pain. OMT 02/23/2021. Patient states same aches. Left knee pain.  Patient's left knee did have a subluxation.  Patient states sometimes still has some pain and sometimes at night has the discomfort as well.  Patient denies any instability of the knee at the moment.  Medications patient has been prescribed: Meloxicam  Taking:         Reviewed prior external information including notes and imaging from previsou exam, outside providers and external EMR if available.   As well as notes that were available from care everywhere and other healthcare systems.  Past medical history, social, surgical and family history all reviewed in electronic medical record.  No pertanent information unless stated regarding to the chief complaint.   Past Medical History:  Diagnosis Date   Allergy    Anxiety    Depression    PCOS (polycystic ovarian syndrome) 11/2016    No Known Allergies   Review of Systems:  No headache, visual changes, nausea, vomiting, diarrhea, constipation, dizziness, abdominal pain, skin rash, fevers, chills, night sweats, weight loss, swollen lymph nodes, body aches, joint swelling, chest pain, shortness of breath, mood changes. POSITIVE muscle aches  Objective  Blood pressure 116/82, pulse 99,  height 5\' 6"  (1.676 m), SpO2 96 %.   General: No apparent distress alert and oriented x3 mood and affect normal, dressed appropriately.  HEENT: Pupils equal, extraocular movements intact  Respiratory: Patient's speak in full sentences and does not appear short of breath  Cardiovascular: No lower extremity edema, non tender, no erythema  Back exam does have some loss of lordosis.  Some tenderness to palpation of the paraspinal musculature.  No neck exam does have some limited sidebending bilaterally.  Tightness with FABER test.  Left knee still has lateral tracking of the patella noted.  Mild positive patellar grind test.  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  Patient given a brace again that I think will be more beneficial.  Patient encouraged to wear it on a more regular basis as well as icing regimen.  Wants to hold on any injection if possible.  Increase activity slowly.  Follow-up with me again in 6 to 8 weeks.  SI (sacroiliac) joint dysfunction Chronic, with mild exacerbation.  Discussed icing regimen and home exercises.  Patient does respond well to manipulation.  Patient does have meloxicam and Zanaflex for breakthrough.  Follow-up again in 6 to 8 weeks   Nonallopathic problems  Decision today to treat with  OMT was based on Physical Exam  After verbal consent patient was treated with HVLA, ME, FPR techniques in cervical, rib, thoracic, lumbar, and sacral  areas  Patient tolerated the procedure well with improvement in symptoms  Patient given exercises, stretches and lifestyle modifications  See medications in patient instructions if  given  Patient will follow up in 4-8 weeks      The above documentation has been reviewed and is accurate and complete [name], DO        Note: This dictation was prepared with Dragon dictation along with smaller phrase technology. Any transcriptional errors that result from this process are unintentional.

## 2021-06-30 ENCOUNTER — Other Ambulatory Visit: Payer: Self-pay

## 2021-06-30 ENCOUNTER — Ambulatory Visit (INDEPENDENT_AMBULATORY_CARE_PROVIDER_SITE_OTHER): Payer: 59 | Admitting: Family Medicine

## 2021-06-30 ENCOUNTER — Encounter: Payer: Self-pay | Admitting: Family Medicine

## 2021-06-30 VITALS — BP 116/82 | HR 99 | Ht 66.0 in

## 2021-06-30 DIAGNOSIS — M533 Sacrococcygeal disorders, not elsewhere classified: Secondary | ICD-10-CM

## 2021-06-30 DIAGNOSIS — M9908 Segmental and somatic dysfunction of rib cage: Secondary | ICD-10-CM | POA: Diagnosis not present

## 2021-06-30 DIAGNOSIS — M9902 Segmental and somatic dysfunction of thoracic region: Secondary | ICD-10-CM

## 2021-06-30 DIAGNOSIS — M9903 Segmental and somatic dysfunction of lumbar region: Secondary | ICD-10-CM

## 2021-06-30 DIAGNOSIS — M9904 Segmental and somatic dysfunction of sacral region: Secondary | ICD-10-CM

## 2021-06-30 DIAGNOSIS — S83002A Unspecified subluxation of left patella, initial encounter: Secondary | ICD-10-CM

## 2021-06-30 DIAGNOSIS — M9901 Segmental and somatic dysfunction of cervical region: Secondary | ICD-10-CM | POA: Diagnosis not present

## 2021-06-30 NOTE — Assessment & Plan Note (Signed)
Chronic, with mild exacerbation.  Discussed icing regimen and home exercises.  Patient does respond well to manipulation.  Patient does have meloxicam and Zanaflex for breakthrough.  Follow-up again in 6 to 8 weeks

## 2021-06-30 NOTE — Assessment & Plan Note (Signed)
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  Patient given a brace again that I think will be more beneficial.  Patient encouraged to wear it on a more regular basis as well as icing regimen.  Wants to hold on any injection if possible.  Increase activity slowly.  Follow-up with me again in 6 to 8 weeks.

## 2021-06-30 NOTE — Patient Instructions (Signed)
Ice 20 minutes 2 times daily. Usually after activity and before bed. Voltaren 2x a day Try to stay active  See you again in 5-6 weeks

## 2021-07-12 ENCOUNTER — Other Ambulatory Visit (HOSPITAL_COMMUNITY): Payer: Self-pay

## 2021-07-12 ENCOUNTER — Telehealth (INDEPENDENT_AMBULATORY_CARE_PROVIDER_SITE_OTHER): Payer: 59 | Admitting: Family Medicine

## 2021-07-12 ENCOUNTER — Encounter: Payer: Self-pay | Admitting: Family Medicine

## 2021-07-12 VITALS — Ht 66.0 in | Wt 190.0 lb

## 2021-07-12 DIAGNOSIS — F900 Attention-deficit hyperactivity disorder, predominantly inattentive type: Secondary | ICD-10-CM

## 2021-07-12 DIAGNOSIS — F411 Generalized anxiety disorder: Secondary | ICD-10-CM

## 2021-07-12 MED ORDER — BUPROPION HCL ER (XL) 300 MG PO TB24
300.0000 mg | ORAL_TABLET | Freq: Every day | ORAL | 1 refills | Status: DC
  Filled 2021-07-12: qty 90, 90d supply, fill #0
  Filled 2021-11-08: qty 90, 90d supply, fill #1

## 2021-07-12 NOTE — Progress Notes (Signed)
Virtual Visit via Video   I connected with patient on 07/12/21 at  9:30 AM EST by a video enabled telemedicine application and verified that I am speaking with the correct person using two identifiers.  Location patient: Home Location provider: Acupuncturist, Office Persons participating in the virtual visit: Patient, Provider, Cheryl (Faby C)  I discussed the limitations of evaluation and management by telemedicine and the availability of in person appointments. The patient expressed understanding and agreed to proceed.  Subjective:   HPI:   'medication management'- pt finds it hard to tell if things are environmental or situational or if she needs a medication adjustment.  Pt returned to school this semester.  Classes started ~3 weeks ago.  Has already had to drop a class due to increased anxiety/panic.  Attendance is suffering.  Continues to work w/ her Social worker.  Will take Xanax prn.  Currently on Prozac 40mg  and Wellbutrin 150mg  daily.  Pt is also wondering if there is a way to get accommodations for school.  Pt would like something in place regarding attendance when she has panic attacks.  Pt would also like to be able to use her computer to take notes in class rather than using pen and paper.  ROS:   See pertinent positives and negatives per HPI.  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   Nonallopathic lesion of sacral region 07/14/2019   Nonallopathic lesion of lumbosacral region 07/14/2019   Nonallopathic lesion of thoracic region 07/14/2019   Medial tibial stress syndrome, right, initial encounter 07/14/2019   Palpitations 04/13/2019   PCOS (polycystic ovarian syndrome) 11/16/2016   Physical exam 11/15/2016    Amenorrhea 11/15/2016   Weight gain 11/15/2016   GAD (generalized anxiety disorder) 03/22/2015   MDD (major depressive disorder), recurrent episode, moderate ([place]) 03/17/2015    Social History   Tobacco Use   Smoking status: Former    Types: Cigarettes    Quit date: 10/21/2017    Years since quitting: 3.7   Smokeless tobacco: Never  Substance Use Topics   Alcohol use: No    Alcohol/week: 0.0 standard drinks    Current Outpatient Medications:    albuterol (VENTOLIN HFA) 108 (90 Base) MCG/ACT inhaler, INHALE 2 PUFFS INTO THE LUNGS EVERY 6 (SIX) HOURS AS NEEDED FOR WHEEZING OR SHORTNESS OF BREATH., Disp: 18 g, Rfl: 0   ALPRAZolam (XANAX) 0.5 MG tablet, TAKE 1 TABLET BY MOUTH 2 TIMES DAILY AS NEEDED FOR ANXIETY., Disp: 30 tablet, Rfl: 1   buPROPion (WELLBUTRIN XL) 150 MG 24 hr tablet, Take 1 tablet (150 mg total) by mouth daily., Disp: 30 tablet, Rfl: 3   cycloSPORINE (RESTASIS) 0.05 % ophthalmic emulsion, Instill 1 drop into both eyes twice a day, Disp: 180 each, Rfl: 3   EPINEPHrine 0.3 mg/0.3 mL IJ SOAJ injection, , Disp: , Rfl: 1   etonogestrel-ethinyl estradiol (NUVARING) 0.12-0.015 MG/24HR vaginal ring, INSERT 1 RING VAGINALLY ONCE EVERY 4 WEEKS; LEAVE IN FOR 3 WEEKS AND REMOVE FOR 1 WEEK AS DIRECTED BY PROVIDER, Disp: 3 each, Rfl: 6   fexofenadine-pseudoephedrine (ALLEGRA-D ALLERGY & CONGESTION) 180-240 MG 24 hr tablet, Take 1 tablet by mouth daily., Disp: 20 tablet, Rfl: 0   FLUoxetine (PROZAC) 40 MG capsule, TAKE 1 CAPSULE BY MOUTH ONCE A DAY,  Disp: 90 capsule, Rfl: 1   fluticasone (FLONASE) 50 MCG/ACT nasal spray, INSTILL 1 TO 2 SPRAYS INTO BOTH NOSTRILS DAILY, Disp: 16 g, Rfl: 0   loratadine-pseudoephedrine (CLARITIN-D 24 HOUR) 10-240 MG 24 hr tablet, Take 1 tablet by mouth daily., Disp: 30 tablet, Rfl: 0   meloxicam (MOBIC) 15 MG tablet, Take 1 tablet (15 mg total) by mouth daily., Disp: 30 tablet, Rfl: 0   ondansetron (ZOFRAN) 4 MG tablet, Take 1 tablet (4 mg total) by mouth  every 8 (eight) hours as needed for nausea or vomiting., Disp: 20 tablet, Rfl: 1   phentermine (ADIPEX-P) 37.5 MG tablet, Take 1 tablet by mouth daily in the morning., Disp: 30 tablet, Rfl: 2   tiZANidine (ZANAFLEX) 2 MG tablet, TAKE 1 TABLET BY MOUTH AT BEDTIME, Disp: 15 tablet, Rfl: 0   traZODone (DESYREL) 50 MG tablet, TAKE 1/2 TO 1 TABLET BY MOUTH AT BEDTIME AS NEEDED FOR SLEEP., Disp: 30 tablet, Rfl: 3   benzonatate (TESSALON) 100 MG capsule, Take 1 capsule by mouth 2 (two) times daily as needed for cough. (Patient not taking: Reported on 07/12/2021), Disp: 20 capsule, Rfl: 0  No Known Allergies  Objective:   Ht 5\' 6"  (1.676 m) Comment: reported   Wt 190 lb (86.2 kg) Comment: reported   BMI 30.67 kg/m  AAOx3, NAD NCAT, EOMI No obvious CN deficits Coloring WNL Pt is able to speak clearly, coherently without shortness of breath or increased work of breathing.  Thought process is linear.  Mood is appropriate.   Assessment and Plan:   GAD- deteriorated.  Pt has returned to school and is already very overwhelmed w/ the semester.  Has had to drop a class and is struggling w/ the attendance policy in another.  Will increase her Wellbutrin to 300mg  daily and continue the Prozac 40mg  daily.  She continues to work with her counselor which is beneficial.  Will attempt to get accommodations in place to help her navigate the semester more easily.  Letter was sent for pt to take to the office of student affairs.  ADHD- ongoing.  Will increase Wellbutrin to 300mg  daily and ask for note taking accommodations to make the semester easier.  Annye Asa, MD 07/12/2021

## 2021-07-19 ENCOUNTER — Other Ambulatory Visit (HOSPITAL_COMMUNITY): Payer: Self-pay

## 2021-07-20 ENCOUNTER — Other Ambulatory Visit (HOSPITAL_COMMUNITY): Payer: Self-pay

## 2021-07-27 NOTE — Progress Notes (Signed)
[place] Phone: [phone] Subjective:   [name], am serving as a scribe for Dr. Hulan Saas.  This visit occurred during the SARS-CoV-2 public health emergency.  Safety protocols were in place, including screening questions prior to the visit, additional usage of staff PPE, and extensive cleaning of exam room while observing appropriate contact time as indicated for disinfecting solutions.    I'm seeing this patient by the request  of:  Midge Minium, MD  CC: Neck and back pain follow up   POE:UMPNTIRWER  RONNETTA CURRINGTON is a 21 y.o. female coming in with complaint of back and neck pain. OMT on 07/03/2021. Patient states overall having some discomfort.  Patient has been working more and playing on her phone more.  Discussed which activities to do which ones to avoid.  Increase activity slowly.  We will follow-up again in 4 to 8 weeks.  Medications patient has been prescribed: None  Taking:         Reviewed prior external information including notes and imaging from previsou exam, outside providers and external EMR if available.   As well as notes that were available from care everywhere and other healthcare systems.  Past medical history, social, surgical and family history all reviewed in electronic medical record.  No pertanent information unless stated regarding to the chief complaint.   Past Medical History:  Diagnosis Date   Allergy    Anxiety    Depression    PCOS (polycystic ovarian syndrome) 11/2016    No Known Allergies   Review of Systems:  No headache, visual changes, nausea, vomiting, diarrhea, constipation, dizziness, abdominal pain, skin rash, fevers, chills, night sweats, weight loss, swollen lymph nodes, body aches, joint swelling, chest pain, shortness of breath, mood changes. POSITIVE muscle aches  Objective  Blood pressure 104/72, pulse 99, height 5\' 6"  (1.676  m), weight 195 lb (88.5 kg), SpO2 96 %.   General: No apparent distress alert and oriented x3 mood and affect normal, dressed appropriately.  HEENT: Pupils equal, extraocular movements intact  Respiratory: Patient's speak in full sentences and does not appear short of breath  Cardiovascular: No lower extremity edema, non tender, no erythema  Patient is going to Loss of lordosis.  Patient does have tightness noted in right ankle right greater than left as well.  Tightness with Corky Sox right greater than left.  Osteopathic findings  C2 flexed rotated and side bent right C7 flexed rotated and side bent right T3 extended rotated and side bent right inhaled rib T9 extended rotated and side bent left L2 flexed rotated and side bent right Sacrum right on right       Assessment and Plan:  SI (sacroiliac) joint dysfunction Chronic, mild exacerbation discussed HEP, proper ergonomics at work as well.  Discussed icing regimen.  Follow-up with me again in 6 to 8 weeks   Nonallopathic problems  Decision today to treat with OMT was based on Physical Exam  After verbal consent patient was treated with HVLA, ME, FPR techniques in cervical, rib, thoracic, lumbar, and sacral  areas  Patient tolerated the procedure well with improvement in symptoms  Patient given exercises, stretches and lifestyle modifications  See medications in patient instructions if given  Patient will follow up in 4-8 weeks      The above documentation has been reviewed and is accurate and complete [name], DO        Note:  This dictation was prepared with Dragon dictation along with smaller phrase technology. Any transcriptional errors that result from this process are unintentional.

## 2021-07-28 ENCOUNTER — Ambulatory Visit (INDEPENDENT_AMBULATORY_CARE_PROVIDER_SITE_OTHER): Payer: 59 | Admitting: Family Medicine

## 2021-07-28 ENCOUNTER — Other Ambulatory Visit: Payer: Self-pay

## 2021-07-28 ENCOUNTER — Encounter: Payer: Self-pay | Admitting: Family Medicine

## 2021-07-28 VITALS — BP 104/72 | HR 99 | Ht 66.0 in | Wt 195.0 lb

## 2021-07-28 DIAGNOSIS — M9901 Segmental and somatic dysfunction of cervical region: Secondary | ICD-10-CM

## 2021-07-28 DIAGNOSIS — M9908 Segmental and somatic dysfunction of rib cage: Secondary | ICD-10-CM | POA: Diagnosis not present

## 2021-07-28 DIAGNOSIS — M9902 Segmental and somatic dysfunction of thoracic region: Secondary | ICD-10-CM

## 2021-07-28 DIAGNOSIS — M9904 Segmental and somatic dysfunction of sacral region: Secondary | ICD-10-CM | POA: Diagnosis not present

## 2021-07-28 DIAGNOSIS — M9903 Segmental and somatic dysfunction of lumbar region: Secondary | ICD-10-CM

## 2021-07-28 DIAGNOSIS — M533 Sacrococcygeal disorders, not elsewhere classified: Secondary | ICD-10-CM

## 2021-07-28 NOTE — Patient Instructions (Signed)
Great to see you Have a good weekend Planks in neutral Vertical mouse See me again in 5-6 weeks

## 2021-07-28 NOTE — Assessment & Plan Note (Signed)
Chronic, mild exacerbation discussed HEP, proper ergonomics at work as well.  Discussed icing regimen.  Follow-up with me again in 6 to 8 weeks

## 2021-08-07 ENCOUNTER — Ambulatory Visit (INDEPENDENT_AMBULATORY_CARE_PROVIDER_SITE_OTHER): Payer: 59

## 2021-08-07 ENCOUNTER — Other Ambulatory Visit (HOSPITAL_COMMUNITY): Payer: Self-pay

## 2021-08-07 ENCOUNTER — Ambulatory Visit: Payer: 59 | Admitting: Sports Medicine

## 2021-08-07 ENCOUNTER — Ambulatory Visit: Payer: Self-pay

## 2021-08-07 ENCOUNTER — Other Ambulatory Visit: Payer: Self-pay

## 2021-08-07 VITALS — BP 110/80 | HR 100 | Ht 66.0 in | Wt 195.0 lb

## 2021-08-07 DIAGNOSIS — S93401A Sprain of unspecified ligament of right ankle, initial encounter: Secondary | ICD-10-CM | POA: Diagnosis not present

## 2021-08-07 DIAGNOSIS — M25571 Pain in right ankle and joints of right foot: Secondary | ICD-10-CM

## 2021-08-07 DIAGNOSIS — M7989 Other specified soft tissue disorders: Secondary | ICD-10-CM | POA: Diagnosis not present

## 2021-08-07 MED ORDER — MELOXICAM 15 MG PO TABS
15.0000 mg | ORAL_TABLET | Freq: Every day | ORAL | 0 refills | Status: DC
  Filled 2021-08-07 – 2021-08-31 (×2): qty 30, 30d supply, fill #0

## 2021-08-07 NOTE — Progress Notes (Signed)
Benito Mccreedy D.[place] Phone: [phone]   Assessment and Plan:     1. Right ankle pain, unspecified chronicity -Acute, initial sports medicine visit - Suspect grade 1 versus 2 lateral ankle sprain based on HPI, physical exam - Weightbearing as tolerated with Cam boot walker.  Recommend using crutches for the time being if ambulation is still painful in cam boot - Start meloxicam 15 mg daily x2 weeks.  If still having pain after 2 weeks, complete 3rd-week of meloxicam. May use remaining meloxicam as needed once daily for pain control.  Do not to use additional NSAIDs while taking meloxicam.  May use Tylenol [phone] mg to 3 times a day for breakthrough pain. - Continue RICE therapy  - X-ray obtained in clinic.  My interpretation:no acute fracture or dislocation.  Soft tissue lateral ankle edema.  Large os navicular and small os perineum that do not correspond with TTP on physical exam - DG Ankle Complete Right; Future - DG Foot Complete Right; Future - DG Tibia/Fibula Right; Future - [place] LIMITED JOINT SPACE STRUCTURES LOW RIGHT(NO LINKED CHARGES)   Sports Medicine: Musculoskeletal Ultrasound. Exam: Limited US of right ankle Diagnosis: Right ankle pain with weakness in dorsiflexion  US Findings: EDL, EDB, anterior tibialis tendons all intact without significant edema.  Superficial soft tissue edema seen over lateral ankle.  Unable to clearly visualize ATFL, CFL  US Impression:  Lateral ankle sprain   Pertinent previous records reviewed include none   Follow Up: 2 weeks for reevaluation.  Could progress to ankle brace and strengthening HEP if improving   Subjective:   I, Moenique Parris, am serving as a Education administrator for Doctor Glennon Mac  Chief Complaint: right Ankle sprain   HPI:   08/07/21 Patient is a 21 year old female complaining of ankle pain. Patient states that she was playing basketball  last night made a wrong move twisted it felt a pop no numbness tingling pain is going up the calf and down to the top of the foot , medial ankle hurts a little , has taken advil not much help has been icing feel like the  pain has gotten , wasn't able to weight bare once it happened she was not able to walk , isn't  able to planter flex has to use heel when driving   [place] ankle rules  positive for pain  lateral malleolus,  Able to walk but all pressure is on the heel  Tender with medial palpation , midfoot palpation  No pain base of the 5th   Relevant Historical Information: None pertinent  Additional pertinent review of systems negative.   Current Outpatient Medications:    ALPRAZolam (XANAX) 0.5 MG tablet, TAKE 1 TABLET BY MOUTH 2 TIMES DAILY AS NEEDED FOR ANXIETY., Disp: 30 tablet, Rfl: 1   benzonatate (TESSALON) 100 MG capsule, Take 1 capsule by mouth 2 (two) times daily as needed for cough., Disp: 20 capsule, Rfl: 0   buPROPion (WELLBUTRIN XL) 300 MG 24 hr tablet, Take 1 tablet (300 mg total) by mouth daily., Disp: 90 tablet, Rfl: 1   cycloSPORINE (RESTASIS) 0.05 % ophthalmic emulsion, Instill 1 drop into both eyes twice a day, Disp: 180 each, Rfl: 3   EPINEPHrine 0.3 mg/0.3 mL IJ SOAJ injection, , Disp: , Rfl: 1   etonogestrel-ethinyl estradiol (NUVARING) 0.12-0.015 MG/24HR vaginal ring, INSERT 1 RING VAGINALLY ONCE EVERY 4 WEEKS; LEAVE IN FOR 3 WEEKS AND REMOVE FOR  1 WEEK AS DIRECTED BY PROVIDER, Disp: 3 each, Rfl: 6   fexofenadine-pseudoephedrine (ALLEGRA-D ALLERGY & CONGESTION) 180-240 MG 24 hr tablet, Take 1 tablet by mouth daily., Disp: 20 tablet, Rfl: 0   FLUoxetine (PROZAC) 40 MG capsule, TAKE 1 CAPSULE BY MOUTH ONCE A DAY, Disp: 90 capsule, Rfl: 1   fluticasone (FLONASE) 50 MCG/ACT nasal spray, INSTILL 1 TO 2 SPRAYS INTO BOTH NOSTRILS DAILY, Disp: 16 g, Rfl: 0   loratadine-pseudoephedrine (CLARITIN-D 24 HOUR) 10-240 MG 24 hr tablet, Take 1 tablet by mouth daily., Disp: 30  tablet, Rfl: 0   meloxicam (MOBIC) 15 MG tablet, Take 1 tablet (15 mg total) by mouth daily., Disp: 30 tablet, Rfl: 0   meloxicam (MOBIC) 15 MG tablet, Take 1 tablet (15 mg total) by mouth daily., Disp: 30 tablet, Rfl: 0   ondansetron (ZOFRAN) 4 MG tablet, Take 1 tablet (4 mg total) by mouth every 8 (eight) hours as needed for nausea or vomiting., Disp: 20 tablet, Rfl: 1   phentermine (ADIPEX-P) 37.5 MG tablet, Take 1 tablet by mouth daily in the morning., Disp: 30 tablet, Rfl: 2   tiZANidine (ZANAFLEX) 2 MG tablet, TAKE 1 TABLET BY MOUTH AT BEDTIME, Disp: 15 tablet, Rfl: 0   traZODone (DESYREL) 50 MG tablet, TAKE 1/2 TO 1 TABLET BY MOUTH AT BEDTIME AS NEEDED FOR SLEEP., Disp: 30 tablet, Rfl: 3   albuterol (VENTOLIN HFA) 108 (90 Base) MCG/ACT inhaler, INHALE 2 PUFFS INTO THE LUNGS EVERY 6 (SIX) HOURS AS NEEDED FOR WHEEZING OR SHORTNESS OF BREATH., Disp: 18 g, Rfl: 0   Objective:     Vitals:   08/07/21 1125  BP: 110/80  Pulse: 100  SpO2: 97%  Weight: 195 lb (88.5 kg)  Height: 5\' 6"  (1.676 m)      Body mass index is 31.47 kg/m.    Physical Exam:    Gen: Appears well, nad, nontoxic and pleasant Psych: Alert and oriented, appropriate mood and affect Neuro: sensation intact, strength is 5/5 with df/pf/inv/ev, muscle tone wnl Skin: no susupicious lesions or rashes  Right ankle: no deformity, large lateral ankle swelling and effusion without ecchymosis TTP severely lateral malleolus,ATFL, CFL, .  TTP moderately at fibular head, medial malleolus and deltoid.  Mildly TTP over navicular NTTP over  achilles, , base of 5th,  calcaneous or midfoot ROM DF 15, PF 45, inv/ev   Negative ant drawer, talar tilt, rotation test, squeeze test. Positive squeeze test Neg thompson   pain with resisted inversion or eversion    Electronically signed by:  Benito Mccreedy D.[facility] 12:40 PM 08/07/21

## 2021-08-07 NOTE — Patient Instructions (Addendum)
Good to see you  Ankle HEP - Start meloxicam 15 mg daily x2 weeks.  If still having pain after 2 weeks, complete 3rd-week of meloxicam. May use remaining meloxicam as needed once daily for pain control.  Do not to use additional NSAIDs while taking meloxicam.  May use Tylenol [phone] mg to 3 times a day for breakthrough pain. Come out of boot once a  day Wear boot when walking should be pain free when walking if not recommend using crutches  2 week follow up

## 2021-08-15 ENCOUNTER — Other Ambulatory Visit (HOSPITAL_COMMUNITY): Payer: Self-pay

## 2021-08-18 NOTE — Progress Notes (Signed)
? ? Benito Mccreedy D.Merril Abbe ?[facility] ?[place] ?Phone: [phone] ?  ?Assessment and Plan:   ?  ?1. Inversion sprain of right ankle, subsequent encounter ?2. Right ankle pain, unspecified chronicity ?-Acute, improving, subsequent visit ?- Likely grade 1 lateral ankle sprain based on HPI, physical exam, improving with 2 weeks of conservative therapy ?- Discontinue cam boot and start using lace up ankle brace when ambulatory.  Can buy OTC from [place] or sporting goods store ?- Continue 1 additional week of meloxicam and then discontinue regular use.  May use remainder as needed ?- Continue HEP for ankle range of motion, and start HEP for ankle strengthening ?- Offered PT, however this is difficult with patient's current work and school schedule ?  ?Pertinent previous records reviewed include none ?  ?Follow Up: 2 weeks for reevaluation.  Could consider PT versus repeat ultrasound if no improvement or worsening of symptoms ?  ?Subjective:   ?I, Pincus Badder, am serving as a Education administrator for Doctor [employer] ? ?Chief Complaint: right ankle pain  ? ?HPI:  ?  ?08/07/21 ?Patient is a 21 year old female complaining of ankle pain. Patient states that she was playing basketball last night made a wrong move twisted it felt a pop no numbness tingling pain is going up the calf and down to the top of the foot , medial ankle hurts a little , has taken advil not much help has been icing feel like the  pain has gotten , wasn't able to weight bare once it happened she was not able to walk , isn't  able to planter flex has to use heel when driving  ?  ?[place] ankle rules ? positive for pain  lateral malleolus,  ?Able to walk but all pressure is on the heel  ?Tender with medial palpation , midfoot palpation  ?No pain base of the 5th  ? ?08/21/2021 ?Patient states that the ankle is doing pretty good, is still painful going downstairs still sensitive to touch, when the ankle is  stretched there is still painful , nothing that it super concerning to her ? ? ?Relevant Historical Information: None pertinent ? ?Additional pertinent review of systems negative. ? ? ?Current Outpatient Medications:  ?  ALPRAZolam (XANAX) 0.5 MG tablet, TAKE 1 TABLET BY MOUTH 2 TIMES DAILY AS NEEDED FOR ANXIETY., Disp: 30 tablet, Rfl: 1 ?  benzonatate (TESSALON) 100 MG capsule, Take 1 capsule by mouth 2 (two) times daily as needed for cough., Disp: 20 capsule, Rfl: 0 ?  buPROPion (WELLBUTRIN XL) 300 MG 24 hr tablet, Take 1 tablet (300 mg total) by mouth daily., Disp: 90 tablet, Rfl: 1 ?  cycloSPORINE (RESTASIS) 0.05 % ophthalmic emulsion, Instill 1 drop into both eyes twice a day, Disp: 180 each, Rfl: 3 ?  EPINEPHrine 0.3 mg/0.3 mL IJ SOAJ injection, , Disp: , Rfl: 1 ?  etonogestrel-ethinyl estradiol (NUVARING) 0.12-0.015 MG/24HR vaginal ring, INSERT 1 RING VAGINALLY ONCE EVERY 4 WEEKS; LEAVE IN FOR 3 WEEKS AND REMOVE FOR 1 WEEK AS DIRECTED BY PROVIDER, Disp: 3 each, Rfl: 6 ?  fexofenadine-pseudoephedrine (ALLEGRA-D ALLERGY & CONGESTION) 180-240 MG 24 hr tablet, Take 1 tablet by mouth daily., Disp: 20 tablet, Rfl: 0 ?  FLUoxetine (PROZAC) 40 MG capsule, TAKE 1 CAPSULE BY MOUTH ONCE A DAY, Disp: 90 capsule, Rfl: 1 ?  loratadine-pseudoephedrine (CLARITIN-D 24 HOUR) 10-240 MG 24 hr tablet, Take 1 tablet by mouth daily., Disp: 30 tablet, Rfl: 0 ?  meloxicam (MOBIC) 15 MG tablet, Take 1 tablet (15 mg total) by mouth daily., Disp: 30 tablet, Rfl: 0 ?  meloxicam (MOBIC) 15 MG tablet, Take 1 tablet (15 mg total) by mouth daily., Disp: 30 tablet, Rfl: 0 ?  ondansetron (ZOFRAN) 4 MG tablet, Take 1 tablet (4 mg total) by mouth every 8 (eight) hours as needed for nausea or vomiting., Disp: 20 tablet, Rfl: 1 ?  phentermine (ADIPEX-P) 37.5 MG tablet, Take 1 tablet by mouth daily in the morning., Disp: 30 tablet, Rfl: 2 ?  tiZANidine (ZANAFLEX) 2 MG tablet, TAKE 1 TABLET BY MOUTH AT BEDTIME, Disp: 15 tablet, Rfl: 0 ?  traZODone  (DESYREL) 50 MG tablet, TAKE 1/2 TO 1 TABLET BY MOUTH AT BEDTIME AS NEEDED FOR SLEEP., Disp: 30 tablet, Rfl: 3 ?  albuterol (VENTOLIN HFA) 108 (90 Base) MCG/ACT inhaler, INHALE 2 PUFFS INTO THE LUNGS EVERY 6 (SIX) HOURS AS NEEDED FOR WHEEZING OR SHORTNESS OF BREATH., Disp: 18 g, Rfl: 0 ?  fluticasone (FLONASE) 50 MCG/ACT nasal spray, INSTILL 1 TO 2 SPRAYS INTO BOTH NOSTRILS DAILY, Disp: 16 g, Rfl: 0  ? ?Objective:   ?  ?Vitals:  ? 08/21/21 1523  ?BP: 120/80  ?Pulse: (!) 116  ?SpO2: 99%  ?Height: 5\' 6"  (1.676 m)  ?  ?  ?Body mass index is 31.47 kg/m?.  ?  ?Physical Exam:   ? ?Gen: Appears well, nad, nontoxic and pleasant ?Psych: Alert and oriented, appropriate mood and affect ?Neuro: sensation intact, strength is 5/5 with df/pf/inv/ev, muscle tone wnl ?Skin: no susupicious lesions or rashes ? ?Right ankle: no deformity, no swelling or effusion ?TTP mildly lateral malleolus, and moderately to ATFL, CFL ?NTTP over fibular head, l medial mal, achilles, navicular, base of 5th,  deltoid, calcaneous or midfoot ?ROM DF 30, PF 45, inv/ev intact ?Negative ant drawer, talar tilt, rotation test, squeeze test. ?Neg thompson ?No pain with  eversion  ?Mild pain with inversion ? ?Electronically signed by:  ?Benito Mccreedy D.Merril Abbe ?[facility] ?3:38 PM 08/21/21 ?

## 2021-08-21 ENCOUNTER — Ambulatory Visit: Payer: 59 | Admitting: Sports Medicine

## 2021-08-21 ENCOUNTER — Other Ambulatory Visit: Payer: Self-pay

## 2021-08-21 VITALS — BP 120/80 | HR 116 | Ht 66.0 in

## 2021-08-21 DIAGNOSIS — S93401D Sprain of unspecified ligament of right ankle, subsequent encounter: Secondary | ICD-10-CM

## 2021-08-21 DIAGNOSIS — M25571 Pain in right ankle and joints of right foot: Secondary | ICD-10-CM | POA: Diagnosis not present

## 2021-08-21 NOTE — Patient Instructions (Addendum)
Good to see you  ?Complete 1 additional week of meloxicam then stop , can use remainder as needed ?Transition out of Cam boot and recommend using a lace up ankle brace when walking  ?Continue HEP for ankle  ?2 week follow up ?

## 2021-08-31 ENCOUNTER — Other Ambulatory Visit (HOSPITAL_COMMUNITY): Payer: Self-pay

## 2021-09-07 NOTE — Progress Notes (Deleted)
?  Charlann Boxer D.O. ?[facility] ?[place] ?Phone: [phone] ?Subjective:   ? ?I'm seeing this patient by the request  of:  Midge Minium, MD ? ?CC:  ? ?[id number]  ?Cheryl [name] is a 21 y.o. female coming in with complaint of back and neck pain. OMT on 07/28/2021. Ankle injury seen by Dr. Glennon Mac as well. Patient states  ? ?Medications patient has been prescribed: None ? ?Taking: ? ? ?  ? ? ? ? ?Reviewed prior external information including notes and imaging from previsou exam, outside providers and external EMR if available.  ? ?As well as notes that were available from care everywhere and other healthcare systems. ? ?Past medical history, social, surgical and family history all reviewed in electronic medical record.  No pertanent information unless stated regarding to the chief complaint.  ? ?Past Medical History:  ?Diagnosis Date  ? Allergy   ? Anxiety   ? Depression   ? PCOS (polycystic ovarian syndrome) 11/2016  ?  ?No Known Allergies ? ? ?Review of Systems: ? No headache, visual changes, nausea, vomiting, diarrhea, constipation, dizziness, abdominal pain, skin rash, fevers, chills, night sweats, weight loss, swollen lymph nodes, body aches, joint swelling, chest pain, shortness of breath, mood changes. POSITIVE muscle aches ? ?Objective  ?There were no vitals taken for this visit. ?  ?General: No apparent distress alert and oriented x3 mood and affect normal, dressed appropriately.  ?HEENT: Pupils equal, extraocular movements intact  ?Respiratory: Patient's speak in full sentences and does not appear short of breath  ?Cardiovascular: No lower extremity edema, non tender, no erythema  ?Neuro: Cranial nerves II through XII are intact, neurovascularly intact in all extremities with 2+ DTRs and 2+ pulses.  ?Gait normal with good balance and coordination.  ?MSK:  Non tender with full range of motion and good stability and symmetric strength and tone of  shoulders, elbows, wrist, hip, knee and ankles bilaterally.  ?Back - Normal skin, Spine with normal alignment and no deformity.  No tenderness to vertebral process palpation.  Paraspinous muscles are not tender and without spasm.   Range of motion is full at neck and lumbar sacral regions ? ?Osteopathic findings ? ?C2 flexed rotated and side bent right ?C6 flexed rotated and side bent left ?T3 extended rotated and side bent right inhaled rib ?T9 extended rotated and side bent left ?L2 flexed rotated and side bent right ?Sacrum right on right ? ? ? ? ?  ?Assessment and Plan: ? ? ? ?Nonallopathic problems ? ?Decision today to treat with OMT was based on Physical Exam ? ?After verbal consent patient was treated with HVLA, ME, FPR techniques in cervical, rib, thoracic, lumbar, and sacral  areas ? ?Patient tolerated the procedure well with improvement in symptoms ? ?Patient given exercises, stretches and lifestyle modifications ? ?See medications in patient instructions if given ? ?Patient will follow up in 4-8 weeks ? ?  ? ? ?The above documentation has been reviewed and is accurate and complete Delsa Sale ? ? ?  ? ? Note: This dictation was prepared with Dragon dictation along with smaller phrase technology. Any transcriptional errors that result from this process are unintentional.    ?  ?  ? ?

## 2021-09-08 ENCOUNTER — Ambulatory Visit: Payer: 59 | Admitting: Family Medicine

## 2021-09-08 ENCOUNTER — Other Ambulatory Visit: Payer: Self-pay | Admitting: Family Medicine

## 2021-09-08 ENCOUNTER — Other Ambulatory Visit (HOSPITAL_COMMUNITY): Payer: Self-pay

## 2021-09-08 NOTE — Progress Notes (Signed)
?  Cheryl [name] D.O. ?[facility] ?[place] ?Phone: [phone] ?Subjective:   ?I, Cheryl [name], am serving as a scribe for Dr. Hulan [name]. ? ?This visit occurred during the SARS-CoV-2 public health emergency.  Safety protocols were in place, including screening questions prior to the visit, additional usage of staff PPE, and extensive cleaning of exam room while observing appropriate contact time as indicated for disinfecting solutions.  ?I'm seeing this patient by the request  of:  Cheryl Minium, MD ? ?CC: back and neck pain  ? ?[id number]  ?Cheryl [name] is a 21 y.o. female coming in with complaint of back and neck pain. OMT on 07/28/2021. Also saw Dr. Glennon [name] for ankle injury recently. Patient states that she is doing well.  ?Patient states no longer having any significant discomfort in the ankle.  Does have some hypermobility which she has at baseline.  Patient states that unfortunately has injured this ankle multiple times. ? ? ? ?  ? ? ?Reviewed prior external information including notes and imaging from previsou exam, outside providers and external EMR if available.  ? ?As well as notes that were available from care everywhere and other healthcare systems. ? ?Past medical history, social, surgical and family history all reviewed in electronic medical record.  No pertanent information unless stated regarding to the chief complaint.  ? ?Past Medical History:  ?Diagnosis Date  ? Allergy   ? Anxiety   ? Depression   ? PCOS (polycystic ovarian syndrome) 11/2016  ?  ?No Known Allergies ? ? ?Objective  ?Blood pressure 112/76, pulse (!) 114, height '5\' 6"'$  (1.676 m), SpO2 97 %. ?  ?General: No apparent distress alert and oriented x3 mood and affect normal, dressed appropriately.  ?HEENT: Pupils equal, extraocular movements intact  ?Respiratory: Patient's speak in full sentences and does not appear short of breath  ?Cardiovascular: No lower extremity edema, non  tender, no erythema  ? ? ?Osteopathic findings ? ?C4 flexed rotated and side bent right ?C6 flexed rotated and side bent left ?T3 extended rotated and side bent right inhaled rib ?T11 extended rotated and side bent left ?L2 flexed rotated and side bent right ?Sacrum right on right ? ? ? ? ?  ?Assessment and Plan: ? ?SI (sacroiliac) joint dysfunction ?Continues to have some discomfort overall.  Discussed icing regimen and home exercises, we discussed core strengthening aspect as well.  Patient will increase activity slowly.  Follow-up with me again in 6 to 8 weeks. ?  ? ?Nonallopathic problems ? ?Decision today to treat with OMT was based on Physical Exam ? ?After verbal consent patient was treated with HVLA, ME, FPR techniques in cervical, rib, thoracic, lumbar, and sacral  areas ? ?Patient tolerated the procedure well with improvement in symptoms ? ?Patient given exercises, stretches and lifestyle modifications ? ?See medications in patient instructions if given ? ?Patient will follow up in 4-8 weeks ? ?  ? ?The above documentation has been reviewed and is accurate and complete Cheryl Pulley, DO ? ? ? ?  ? ? Note: This dictation was prepared with Dragon dictation along with smaller phrase technology. Any transcriptional errors that result from this process are unintentional.    ?  ?  ? ?

## 2021-09-11 ENCOUNTER — Other Ambulatory Visit (HOSPITAL_COMMUNITY): Payer: Self-pay

## 2021-09-11 ENCOUNTER — Encounter: Payer: Self-pay | Admitting: Family Medicine

## 2021-09-11 ENCOUNTER — Other Ambulatory Visit: Payer: Self-pay

## 2021-09-11 ENCOUNTER — Ambulatory Visit (INDEPENDENT_AMBULATORY_CARE_PROVIDER_SITE_OTHER): Payer: 59 | Admitting: Family Medicine

## 2021-09-11 ENCOUNTER — Other Ambulatory Visit: Payer: Self-pay | Admitting: Family Medicine

## 2021-09-11 VITALS — BP 112/76 | HR 114 | Ht 66.0 in

## 2021-09-11 DIAGNOSIS — M9901 Segmental and somatic dysfunction of cervical region: Secondary | ICD-10-CM | POA: Diagnosis not present

## 2021-09-11 DIAGNOSIS — M9908 Segmental and somatic dysfunction of rib cage: Secondary | ICD-10-CM

## 2021-09-11 DIAGNOSIS — M9903 Segmental and somatic dysfunction of lumbar region: Secondary | ICD-10-CM | POA: Diagnosis not present

## 2021-09-11 DIAGNOSIS — M533 Sacrococcygeal disorders, not elsewhere classified: Secondary | ICD-10-CM | POA: Diagnosis not present

## 2021-09-11 DIAGNOSIS — M9904 Segmental and somatic dysfunction of sacral region: Secondary | ICD-10-CM | POA: Diagnosis not present

## 2021-09-11 DIAGNOSIS — M9902 Segmental and somatic dysfunction of thoracic region: Secondary | ICD-10-CM | POA: Diagnosis not present

## 2021-09-11 MED ORDER — FLUOXETINE HCL 40 MG PO CAPS
ORAL_CAPSULE | Freq: Every day | ORAL | 1 refills | Status: DC
  Filled 2021-09-11: qty 90, 90d supply, fill #0
  Filled 2022-01-01: qty 90, 90d supply, fill #1

## 2021-09-11 NOTE — Assessment & Plan Note (Signed)
Continues to have some discomfort overall.  Discussed icing regimen and home exercises, we discussed core strengthening aspect as well.  Patient will increase activity slowly.  Follow-up with me again in 6 to 8 weeks. ?

## 2021-09-11 NOTE — Patient Instructions (Signed)
Great to see you ?Ankle looks good-be aware of it ?See me in 5-6 weeks ?

## 2021-09-12 ENCOUNTER — Other Ambulatory Visit (HOSPITAL_COMMUNITY): Payer: Self-pay

## 2021-09-20 DIAGNOSIS — F419 Anxiety disorder, unspecified: Secondary | ICD-10-CM | POA: Diagnosis not present

## 2021-10-09 ENCOUNTER — Other Ambulatory Visit (HOSPITAL_COMMUNITY): Payer: Self-pay

## 2021-10-11 NOTE — Progress Notes (Signed)
?Charlann Boxer D.O. ?[facility] ?[place] ?Phone: [phone] ?Subjective:   ?I, Cheryl [name], am serving as a scribe for Dr. Hulan Saas. ? ?This visit occurred during the SARS-CoV-2 public health emergency.  Safety protocols were in place, including screening questions prior to the visit, additional usage of staff PPE, and extensive cleaning of exam room while observing appropriate contact time as indicated for disinfecting solutions.  ? ?I'm seeing this patient by the request  of:  Midge Minium, MD ? ?CC: Neck and back pain follow-up ? ?[id number]  ?Cheryl [name] is a 21 y.o. female coming in with complaint of back and neck pain. OMT 09/11/2021. Patient states that her pain has been occurring much sooner than her appointment. No new pain though.  ? ?Medications patient has been prescribed: None ? ?Taking: ? ? ?  ? ? ? ? ?Reviewed prior external information including notes and imaging from previsou exam, outside providers and external EMR if available.  ? ?As well as notes that were available from care everywhere and other healthcare systems. ? ?Past medical history, social, surgical and family history all reviewed in electronic medical record.  No pertanent information unless stated regarding to the chief complaint.  ? ?Past Medical History:  ?Diagnosis Date  ? Allergy   ? Anxiety   ? Depression   ? PCOS (polycystic ovarian syndrome) 11/2016  ?  ?No Known Allergies ? ? ?Review of Systems: ? No headache, visual changes, nausea, vomiting, diarrhea, constipation, dizziness, abdominal pain, skin rash, fevers, chills, night sweats, weight loss, swollen lymph nodes, body aches, joint swelling, chest pain, shortness of breath, mood changes. POSITIVE muscle aches ? ?Objective  ?Blood pressure 110/78, pulse 96, height '5\' 6"'$  (1.676 m), weight 193 lb (87.5 kg), SpO2 98 %. ?  ?General: No apparent distress alert and oriented x3 mood and affect normal, dressed  appropriately.  ?HEENT: Pupils equal, extraocular movements intact  ?Respiratory: Patient's speak in full sentences and does not appear short of breath  ?Cardiovascular: No lower extremity edema, non tender, no erythema  ?Neck exam does have some loss of lordosis.  Patient does have some limited sidebending bilaterally.  Patient does have tightness noted in the parascapular region bilaterally.  Seems to be a little bit worse left greater than right. ? ?Osteopathic findings ? ?C2 flexed rotated and side bent left ?T9 extended rotated and side bent left inhaled rib ?L2 flexed rotated and side bent right ?Sacrum right on right ? ? ? ? ?  ?Assessment and Plan: ? ?Neck muscle spasm ?Increased tightness and soreness noted of the neck in the upper back today.  The patient has been doing more studying and doing more photography.  Could be contributing.  Discussed ergonomics.  Discussed which activities to do which ones to avoid, increase activity slowly otherwise.  Follow-up with me again in 6 to 8 weeks.  ? ?Nonallopathic problems ? ?Decision today to treat with OMT was based on Physical Exam ? ?After verbal consent patient was treated with HVLA, ME, FPR techniques in cervical, rib, thoracic, lumbar, and sacral  areas ? ?Patient tolerated the procedure well with improvement in symptoms ? ?Patient given exercises, stretches and lifestyle modifications ? ?See medications in patient instructions if given ? ?Patient will follow up in 4-8 weeks ? ?  ? ? ?The above documentation has been reviewed and is accurate and complete [name], DO  ? ? ?  ? ? Note: This dictation was  prepared with Dragon dictation along with smaller phrase technology. Any transcriptional errors that result from this process are unintentional.    ?  ?  ? ?

## 2021-10-12 ENCOUNTER — Ambulatory Visit (INDEPENDENT_AMBULATORY_CARE_PROVIDER_SITE_OTHER): Payer: 59 | Admitting: Family Medicine

## 2021-10-12 VITALS — BP 110/78 | HR 96 | Ht 66.0 in | Wt 193.0 lb

## 2021-10-12 DIAGNOSIS — M9908 Segmental and somatic dysfunction of rib cage: Secondary | ICD-10-CM | POA: Diagnosis not present

## 2021-10-12 DIAGNOSIS — M62838 Other muscle spasm: Secondary | ICD-10-CM

## 2021-10-12 DIAGNOSIS — M9904 Segmental and somatic dysfunction of sacral region: Secondary | ICD-10-CM

## 2021-10-12 DIAGNOSIS — M9903 Segmental and somatic dysfunction of lumbar region: Secondary | ICD-10-CM | POA: Diagnosis not present

## 2021-10-12 DIAGNOSIS — M9901 Segmental and somatic dysfunction of cervical region: Secondary | ICD-10-CM

## 2021-10-12 DIAGNOSIS — M9902 Segmental and somatic dysfunction of thoracic region: Secondary | ICD-10-CM | POA: Diagnosis not present

## 2021-10-12 NOTE — Assessment & Plan Note (Signed)
Increased tightness and soreness noted of the neck in the upper back today.  The patient has been doing more studying and doing more photography.  Could be contributing.  Discussed ergonomics.  Discussed which activities to do which ones to avoid, increase activity slowly otherwise.  Follow-up with me again in 6 to 8 weeks. ?

## 2021-10-12 NOTE — Patient Instructions (Signed)
Good to see you ?Bring cards next time ?See me again in 5-6 weeks ?

## 2021-10-17 ENCOUNTER — Other Ambulatory Visit (HOSPITAL_COMMUNITY): Payer: Self-pay

## 2021-10-17 DIAGNOSIS — Z6831 Body mass index (BMI) 31.0-31.9, adult: Secondary | ICD-10-CM | POA: Diagnosis not present

## 2021-10-17 DIAGNOSIS — F419 Anxiety disorder, unspecified: Secondary | ICD-10-CM | POA: Diagnosis not present

## 2021-10-17 DIAGNOSIS — E669 Obesity, unspecified: Secondary | ICD-10-CM | POA: Diagnosis not present

## 2021-10-19 ENCOUNTER — Other Ambulatory Visit (HOSPITAL_COMMUNITY): Payer: Self-pay

## 2021-10-24 DIAGNOSIS — F419 Anxiety disorder, unspecified: Secondary | ICD-10-CM | POA: Diagnosis not present

## 2021-10-25 ENCOUNTER — Ambulatory Visit: Payer: 59 | Admitting: Family Medicine

## 2021-10-26 ENCOUNTER — Encounter: Payer: Self-pay | Admitting: Family Medicine

## 2021-10-26 ENCOUNTER — Ambulatory Visit: Payer: 59 | Admitting: Family Medicine

## 2021-10-26 ENCOUNTER — Other Ambulatory Visit (HOSPITAL_COMMUNITY): Payer: Self-pay

## 2021-10-26 VITALS — BP 112/76 | HR 113 | Temp 97.7°F | Resp 17 | Ht 66.0 in | Wt 191.0 lb

## 2021-10-26 DIAGNOSIS — H6983 Other specified disorders of Eustachian tube, bilateral: Secondary | ICD-10-CM | POA: Diagnosis not present

## 2021-10-26 MED ORDER — FLUTICASONE PROPIONATE 50 MCG/ACT NA SUSP
NASAL | 3 refills | Status: DC
Start: 2021-10-26 — End: 2023-05-27
  Filled 2021-10-26: qty 16, 30d supply, fill #0

## 2021-10-26 NOTE — Patient Instructions (Signed)
Follow up as needed or as scheduled ?START the Flonase- 2 sprays each nostril daily ?CONTINUE the Claritin daily ?You can add an OTC decongestant like Sudafed as needed if symptoms are severe ?Drink LOTS of fluids ?Call with any questions or concerns ?Have a great summer!!! ?

## 2021-10-26 NOTE — Progress Notes (Signed)
? ?  Subjective:  ? ? Patient ID: Cheryl [name], female    DOB: [birth year], 21 y.o.   MRN: [id number] ? ?HPI ?B ear pain- 'i'm not sure if I have ear infections or allergies or what'.  Pt reports she has had more frequent muffled hearing and ringing in her ears.  Denies drainage from ears.  No fevers.  Currently taking Claritin.  Has sensation of ears needing to pop. ? ? ?Review of Systems ?For ROS see HPI  ?   ?Objective:  ? Physical Exam ?Vitals reviewed.  ?Constitutional:   ?   General: She is not in acute distress. ?   Appearance: Normal appearance. She is well-developed. She is not ill-appearing.  ?HENT:  ?   Head: Normocephalic and atraumatic.  ?   Right Ear: Ear canal normal. Tympanic membrane is retracted.  ?   Left Ear: Ear canal normal. Tympanic membrane is retracted.  ?   Nose: Mucosal edema and rhinorrhea present.  ?   Right Sinus: No maxillary sinus tenderness or frontal sinus tenderness.  ?   Left Sinus: No maxillary sinus tenderness or frontal sinus tenderness.  ?   Mouth/Throat:  ?   Pharynx: Posterior oropharyngeal erythema (w/ PND) present.  ?Eyes:  ?   Conjunctiva/sclera: Conjunctivae normal.  ?   Pupils: Pupils are equal, round, and reactive to light.  ?Cardiovascular:  ?   Rate and Rhythm: Regular rhythm. Tachycardia present.  ?Pulmonary:  ?   Effort: Pulmonary effort is normal. No respiratory distress.  ?Musculoskeletal:  ?   Cervical back: Normal range of motion and neck supple.  ?Lymphadenopathy:  ?   Cervical: No cervical adenopathy.  ?Skin: ?   General: Skin is warm and dry.  ?Neurological:  ?   General: No focal deficit present.  ?   Mental Status: She is alert and oriented to person, place, and time.  ?Psychiatric:     ?   Mood and Affect: Mood normal.     ?   Behavior: Behavior normal.     ?   Thought Content: Thought content normal.  ? ? ? ? ? ?   ?Assessment & Plan:  ?Eustachian tube dysfxn- new.  No evidence of OM or OE.  Suspect her ear sxs are all allergy related.  She is taking a  daily antihistamine so will add nasal steroid- 2 sprays each nostril daily.  Pt expressed understanding and is in agreement w/ plan.  ? ?

## 2021-10-27 ENCOUNTER — Ambulatory Visit: Payer: 59 | Admitting: Family Medicine

## 2021-10-28 ENCOUNTER — Other Ambulatory Visit (HOSPITAL_COMMUNITY): Payer: Self-pay

## 2021-10-30 ENCOUNTER — Other Ambulatory Visit (HOSPITAL_COMMUNITY): Payer: Self-pay

## 2021-10-31 DIAGNOSIS — F419 Anxiety disorder, unspecified: Secondary | ICD-10-CM | POA: Diagnosis not present

## 2021-11-03 ENCOUNTER — Other Ambulatory Visit (HOSPITAL_COMMUNITY): Payer: Self-pay

## 2021-11-06 ENCOUNTER — Other Ambulatory Visit (HOSPITAL_COMMUNITY): Payer: Self-pay

## 2021-11-07 DIAGNOSIS — F419 Anxiety disorder, unspecified: Secondary | ICD-10-CM | POA: Diagnosis not present

## 2021-11-08 ENCOUNTER — Other Ambulatory Visit: Payer: Self-pay | Admitting: Sports Medicine

## 2021-11-08 ENCOUNTER — Other Ambulatory Visit: Payer: Self-pay | Admitting: Family Medicine

## 2021-11-08 ENCOUNTER — Other Ambulatory Visit (HOSPITAL_COMMUNITY): Payer: Self-pay

## 2021-11-08 MED ORDER — TRAZODONE HCL 50 MG PO TABS
ORAL_TABLET | Freq: Every evening | ORAL | 3 refills | Status: DC | PRN
  Filled 2021-11-08: qty 30, 30d supply, fill #0
  Filled 2021-11-17 – 2022-01-01 (×3): qty 30, 30d supply, fill #1
  Filled 2022-02-16: qty 30, 30d supply, fill #2
  Filled 2022-04-16: qty 30, 30d supply, fill #3

## 2021-11-09 ENCOUNTER — Other Ambulatory Visit (HOSPITAL_COMMUNITY): Payer: Self-pay

## 2021-11-10 ENCOUNTER — Other Ambulatory Visit (HOSPITAL_COMMUNITY): Payer: Self-pay

## 2021-11-15 ENCOUNTER — Other Ambulatory Visit (HOSPITAL_COMMUNITY): Payer: Self-pay

## 2021-11-15 MED ORDER — PHENTERMINE HCL 37.5 MG PO TABS
37.5000 mg | ORAL_TABLET | Freq: Every morning | ORAL | 2 refills | Status: DC
  Filled 2021-11-15: qty 30, 30d supply, fill #0
  Filled 2022-01-01: qty 30, 30d supply, fill #1
  Filled 2022-02-16: qty 30, 30d supply, fill #2

## 2021-11-15 NOTE — Progress Notes (Unsigned)
  Cheryl [name] [address] [place] Phone: [phone] Subjective:   Cheryl [name], am serving as a scribe for Dr. Hulan Saas.  I'm seeing this patient by the request  of:  Midge Minium, MD  CC: back and neck pain   [id number]  Cheryl [name] is a 21 y.o. female coming in with complaint of back and neck pain. OMT on 4/27/203. Patient states same per usual. No new complaints.  Medications patient has been prescribed: None  Taking:         Reviewed prior external information including notes and imaging from previsou exam, outside providers and external EMR if available.   As well as notes that were available from care everywhere and other healthcare systems.  Past medical history, social, surgical and family history all reviewed in electronic medical record.  No pertanent information unless stated regarding to the chief complaint.   Past Medical History:  Diagnosis Date   Allergy    Anxiety    Depression    PCOS (polycystic ovarian syndrome) 11/2016    No Known Allergies   Review of Systems:  No headache, visual changes, nausea, vomiting, diarrhea, constipation, dizziness, abdominal pain, skin rash, fevers, chills, night sweats, weight loss, swollen lymph nodes, body aches, joint swelling, chest pain, shortness of breath, mood changes. POSITIVE muscle aches  Objective  Blood pressure 126/80, pulse (!) 120, height '5\' 6"'$  (1.676 m), weight 192 lb (87.1 kg), SpO2 96 %.   General: No apparent distress alert and oriented x3 mood and affect normal, dressed appropriately.  HEENT: Pupils equal, extraocular movements intact  Respiratory: Patient's speak in full sentences and does not appear short of breath  Cardiovascular: No lower extremity edema, non tender, no erythema  Tightness noted in the left cervical area.  Negative Spurling's.  Patient does have tightness noted in the parascapular region.  Osteopathic  findings  C2 flexed rotated and side bent right C5 flexed rotated and side bent left T3 extended rotated and side bent right inhaled rib T9 extended rotated and side bent left L2 flexed rotated and side bent right Sacrum right on right       Assessment and Plan:  Neck muscle spasm Tightness noted loss of lordosis  Discussed which activities to do patient is to increase activity slowly overall.  Patient does have the muscle relaxer if needed and refill began with some of the mild exacerbation the patient is having.  Follow-up again in 6 to 8 weeks    Nonallopathic problems  Decision today to treat with OMT was based on Physical Exam  After verbal consent patient was treated with HVLA, ME, FPR techniques in cervical, rib, thoracic, lumbar, and sacral  areas  Patient tolerated the procedure well with improvement in symptoms  Patient given exercises, stretches and lifestyle modifications  See medications in patient instructions if given  Patient will follow up in 4-8 weeks      The above documentation has been reviewed and is accurate and complete Cheryl Pulley, DO        Note: This dictation was prepared with Dragon dictation along with smaller phrase technology. Any transcriptional errors that result from this process are unintentional.

## 2021-11-16 ENCOUNTER — Ambulatory Visit: Payer: 59 | Admitting: Family Medicine

## 2021-11-16 ENCOUNTER — Other Ambulatory Visit (HOSPITAL_COMMUNITY): Payer: Self-pay

## 2021-11-16 VITALS — BP 126/80 | HR 120 | Ht 66.0 in | Wt 192.0 lb

## 2021-11-16 DIAGNOSIS — M9901 Segmental and somatic dysfunction of cervical region: Secondary | ICD-10-CM

## 2021-11-16 DIAGNOSIS — M62838 Other muscle spasm: Secondary | ICD-10-CM

## 2021-11-16 DIAGNOSIS — M9903 Segmental and somatic dysfunction of lumbar region: Secondary | ICD-10-CM

## 2021-11-16 DIAGNOSIS — M9904 Segmental and somatic dysfunction of sacral region: Secondary | ICD-10-CM | POA: Diagnosis not present

## 2021-11-16 DIAGNOSIS — M9902 Segmental and somatic dysfunction of thoracic region: Secondary | ICD-10-CM | POA: Diagnosis not present

## 2021-11-16 DIAGNOSIS — M9908 Segmental and somatic dysfunction of rib cage: Secondary | ICD-10-CM | POA: Diagnosis not present

## 2021-11-16 MED ORDER — TIZANIDINE HCL 2 MG PO TABS
ORAL_TABLET | Freq: Every day | ORAL | 0 refills | Status: AC
Start: 2021-11-16 — End: 2022-11-16
  Filled 2021-11-16 – 2022-04-13 (×2): qty 30, 30d supply, fill #0

## 2021-11-16 NOTE — Patient Instructions (Addendum)
Zanaflex refilled Keep working on posture and ergonomics See you again in 5-6 weeks

## 2021-11-16 NOTE — Assessment & Plan Note (Signed)
Tightness noted loss of lordosis  Discussed which activities to do patient is to increase activity slowly overall.  Patient does have the muscle relaxer if needed and refill began with some of the mild exacerbation the patient is having.  Follow-up again in 6 to 8 weeks

## 2021-11-17 ENCOUNTER — Other Ambulatory Visit (HOSPITAL_COMMUNITY): Payer: Self-pay

## 2021-11-25 ENCOUNTER — Other Ambulatory Visit (HOSPITAL_COMMUNITY): Payer: Self-pay

## 2021-11-29 DIAGNOSIS — F419 Anxiety disorder, unspecified: Secondary | ICD-10-CM | POA: Diagnosis not present

## 2021-12-14 ENCOUNTER — Ambulatory Visit (INDEPENDENT_AMBULATORY_CARE_PROVIDER_SITE_OTHER): Payer: 59 | Admitting: Family Medicine

## 2021-12-14 VITALS — BP 122/84 | HR 88 | Ht 66.0 in | Wt 194.0 lb

## 2021-12-14 DIAGNOSIS — M9901 Segmental and somatic dysfunction of cervical region: Secondary | ICD-10-CM

## 2021-12-14 DIAGNOSIS — M62838 Other muscle spasm: Secondary | ICD-10-CM

## 2021-12-14 DIAGNOSIS — M9902 Segmental and somatic dysfunction of thoracic region: Secondary | ICD-10-CM | POA: Diagnosis not present

## 2021-12-14 DIAGNOSIS — M9903 Segmental and somatic dysfunction of lumbar region: Secondary | ICD-10-CM | POA: Diagnosis not present

## 2021-12-14 DIAGNOSIS — M9904 Segmental and somatic dysfunction of sacral region: Secondary | ICD-10-CM | POA: Diagnosis not present

## 2021-12-14 DIAGNOSIS — M9908 Segmental and somatic dysfunction of rib cage: Secondary | ICD-10-CM | POA: Diagnosis not present

## 2021-12-14 NOTE — Patient Instructions (Addendum)
Good to see you Try yoga wheel Will hurt so good See me in 6-8 weeks

## 2021-12-14 NOTE — Assessment & Plan Note (Signed)
Increasing tightness in the lower of the neck on the right again today.  Seems to be more spasm.  Discussed home exercises, icing regimen, we will not make any significant changes in medications at this moment.  Follow-up with me again in 6 to 8 weeks.

## 2021-12-14 NOTE — Progress Notes (Deleted)
  [place] Phone: [phone] Subjective:    I'm seeing this patient by the request  of:  Midge Minium, MD  CC:   [id number]  Cheryl [name] is a 21 y.o. female coming in with complaint of back and neck pain. OMT on 11/16/2021. Patient states   Medications patient has been prescribed: Zanaflex  Taking:         Reviewed prior external information including notes and imaging from previsou exam, outside providers and external EMR if available.   As well as notes that were available from care everywhere and other healthcare systems.  Past medical history, social, surgical and family history all reviewed in electronic medical record.  No pertanent information unless stated regarding to the chief complaint.   Past Medical History:  Diagnosis Date   Allergy    Anxiety    Depression    PCOS (polycystic ovarian syndrome) 11/2016    No Known Allergies   Review of Systems:  No headache, visual changes, nausea, vomiting, diarrhea, constipation, dizziness, abdominal pain, skin rash, fevers, chills, night sweats, weight loss, swollen lymph nodes, body aches, joint swelling, chest pain, shortness of breath, mood changes. POSITIVE muscle aches  Objective  There were no vitals taken for this visit.   General: No apparent distress alert and oriented x3 mood and affect normal, dressed appropriately.  HEENT: Pupils equal, extraocular movements intact  Respiratory: Patient's speak in full sentences and does not appear short of breath  Cardiovascular: No lower extremity edema, non tender, no erythema  Gait MSK:  Back   Osteopathic findings  C2 flexed rotated and side bent right C6 flexed rotated and side bent left T3 extended rotated and side bent right inhaled rib T9 extended rotated and side bent left L2 flexed rotated and side bent right Sacrum right on right       Assessment and  Plan:  No problem-specific Assessment & Plan notes found for this encounter.    Nonallopathic problems  Decision today to treat with OMT was based on Physical Exam  After verbal consent patient was treated with HVLA, ME, FPR techniques in cervical, rib, thoracic, lumbar, and sacral  areas  Patient tolerated the procedure well with improvement in symptoms  Patient given exercises, stretches and lifestyle modifications  See medications in patient instructions if given  Patient will follow up in 4-8 weeks             Note: This dictation was prepared with Dragon dictation along with smaller phrase technology. Any transcriptional errors that result from this process are unintentional.

## 2021-12-14 NOTE — Progress Notes (Signed)
  [place] Phone: [phone] Subjective:   Cheryl [name], am serving as a scribe for Dr. Hulan Saas.   I'm seeing this patient by the request  of:  Cheryl Minium, MD  CC: Neck and back pain follow-up  [id number]  Cheryl [name] is a 21 y.o. female coming in with complaint of back and neck pain. OMT 11/16/2021. 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 [name] new issues since last visit. Back does get sore at times.   Medications patient has been prescribed: Zanaflex  Taking:       Past Medical History:  Diagnosis Date   Allergy    Anxiety    Depression    PCOS (polycystic ovarian syndrome) 11/2016    [name] Known Allergies   Objective  Blood pressure 122/84, pulse 88, height '5\' 6"'$  (1.676 m), weight 194 lb (88 kg), SpO2 98 %.   General: [name], dressed appropriately.  HEENT: Pupils equal, extraocular movements intact  Respiratory: Patient's speak in full sentences and does not appear short of breath  Cardiovascular: [name], non tender, [name]  Gait normal gait MSK:  Back Still has tenderness over the sacroiliac joint seems to be left.  Patient does have tenderness to palpation of the thoracic juncture as well but more pain parascapular region going up side of the neck.  Negative right as well.  Osteopathic findings  C2 flexed rotated and side bent left C6 flexed rotated and side bent left T3 extended rotated and side bent left inhaled rib T6 extended rotated and side bent left L2 flexed rotated and side bent right Sacrum right on right       Assessment and Plan:  Neck muscle spasm Increasing tightness in the lower of the neck on the right again today.  Seems to be more spasm.  Discussed home exercises, icing regimen, we will not make any significant changes in medications at this moment.   Follow-up with me again in 6 to 8 weeks.    Nonallopathic problems  Decision today to treat with OMT was based on Physical Exam  After verbal consent patient was treated with HVLA, ME, FPR techniques in cervical, rib, thoracic, lumbar, and sacral  areas  Patient tolerated the procedure well with improvement in symptoms  Patient given exercises, stretches and lifestyle modifications  See medications in patient instructions if given  Patient will follow up in 4-8 weeks    The above documentation has been reviewed and is accurate and complete Cheryl Pulley, DO          Note: This dictation was prepared with Dragon dictation along with smaller phrase technology. Any transcriptional errors that result from this process are unintentional.

## 2021-12-15 ENCOUNTER — Ambulatory Visit: Payer: 59 | Admitting: Family Medicine

## 2021-12-20 DIAGNOSIS — F419 Anxiety disorder, unspecified: Secondary | ICD-10-CM | POA: Diagnosis not present

## 2022-01-01 ENCOUNTER — Other Ambulatory Visit (HOSPITAL_COMMUNITY): Payer: Self-pay

## 2022-01-03 ENCOUNTER — Ambulatory Visit: Payer: 59 | Admitting: Family Medicine

## 2022-01-10 ENCOUNTER — Other Ambulatory Visit (HOSPITAL_COMMUNITY): Payer: Self-pay

## 2022-01-19 DIAGNOSIS — F419 Anxiety disorder, unspecified: Secondary | ICD-10-CM | POA: Diagnosis not present

## 2022-01-25 NOTE — Progress Notes (Deleted)
  [place] Phone: [phone] Subjective:    I'm seeing this patient by the request  of:  Midge Minium, MD  CC: back and neck pain follow up   Cheryl [name]  IVETT LUEBBE is a 21 y.o. female coming in with complaint of back and neck pain. OMT 12/14/2021. Patient states   Medications patient has been prescribed: Zanaflex  Taking:         Reviewed prior external information including notes and imaging from previsou exam, outside providers and external EMR if available.   As well as notes that were available from care everywhere and other healthcare systems.  Past medical history, social, surgical and family history all reviewed in electronic medical record.  No pertanent information unless stated regarding to the chief complaint.   Past Medical History:  Diagnosis Date   Allergy    Anxiety    Depression    PCOS (polycystic ovarian syndrome) 11/2016    No Known Allergies   Review of Systems:  No headache, visual changes, nausea, vomiting, diarrhea, constipation, dizziness, abdominal pain, skin rash, fevers, chills, night sweats, weight loss, swollen lymph nodes, body aches, joint swelling, chest pain, shortness of breath, mood changes. POSITIVE muscle aches  Objective  There were no vitals taken for this visit.   General: No apparent distress alert and oriented x3 mood and affect normal, dressed appropriately.  HEENT: Pupils equal, extraocular movements intact  Respiratory: Patient's speak in full sentences and does not appear short of breath  Cardiovascular: No lower extremity edema, non tender, no erythema  Gait MSK:  Back   Osteopathic findings  C2 flexed rotated and side bent right C6 flexed rotated and side bent left T3 extended rotated and side bent right inhaled rib T9 extended rotated and side bent left L2 flexed rotated and side bent right Sacrum right on right        Assessment and Plan:  No problem-specific Assessment & Plan notes found for this encounter.    Nonallopathic problems  Decision today to treat with OMT was based on Physical Exam  After verbal consent patient was treated with HVLA, ME, FPR techniques in cervical, rib, thoracic, lumbar, and sacral  areas  Patient tolerated the procedure well with improvement in symptoms  Patient given exercises, stretches and lifestyle modifications  See medications in patient instructions if given  Patient will follow up in 4-8 weeks    The above documentation has been reviewed and is accurate and complete [name], DO          Note: This dictation was prepared with Dragon dictation along with smaller phrase technology. Any transcriptional errors that result from this process are unintentional.

## 2022-01-26 ENCOUNTER — Ambulatory Visit: Payer: 59 | Admitting: Family Medicine

## 2022-02-02 DIAGNOSIS — F419 Anxiety disorder, unspecified: Secondary | ICD-10-CM | POA: Diagnosis not present

## 2022-02-16 ENCOUNTER — Other Ambulatory Visit (HOSPITAL_COMMUNITY): Payer: Self-pay

## 2022-02-16 ENCOUNTER — Other Ambulatory Visit: Payer: Self-pay | Admitting: Family Medicine

## 2022-02-16 DIAGNOSIS — H0288B Meibomian gland dysfunction left eye, upper and lower eyelids: Secondary | ICD-10-CM | POA: Diagnosis not present

## 2022-02-16 DIAGNOSIS — H0102B Squamous blepharitis left eye, upper and lower eyelids: Secondary | ICD-10-CM | POA: Diagnosis not present

## 2022-02-16 DIAGNOSIS — H0288A Meibomian gland dysfunction right eye, upper and lower eyelids: Secondary | ICD-10-CM | POA: Diagnosis not present

## 2022-02-16 DIAGNOSIS — H16423 Pannus (corneal), bilateral: Secondary | ICD-10-CM | POA: Diagnosis not present

## 2022-02-16 DIAGNOSIS — H1045 Other chronic allergic conjunctivitis: Secondary | ICD-10-CM | POA: Diagnosis not present

## 2022-02-16 DIAGNOSIS — H0102A Squamous blepharitis right eye, upper and lower eyelids: Secondary | ICD-10-CM | POA: Diagnosis not present

## 2022-02-20 ENCOUNTER — Other Ambulatory Visit (HOSPITAL_COMMUNITY): Payer: Self-pay

## 2022-02-20 MED ORDER — BUPROPION HCL ER (XL) 300 MG PO TB24
300.0000 mg | ORAL_TABLET | Freq: Every day | ORAL | 1 refills | Status: DC
  Filled 2022-02-20 – 2022-03-05 (×2): qty 90, 90d supply, fill #0
  Filled 2022-06-05: qty 90, 90d supply, fill #1

## 2022-02-27 DIAGNOSIS — H52223 Regular astigmatism, bilateral: Secondary | ICD-10-CM | POA: Diagnosis not present

## 2022-02-27 DIAGNOSIS — H5213 Myopia, bilateral: Secondary | ICD-10-CM | POA: Diagnosis not present

## 2022-02-28 ENCOUNTER — Other Ambulatory Visit (HOSPITAL_COMMUNITY): Payer: Self-pay

## 2022-03-05 ENCOUNTER — Other Ambulatory Visit (HOSPITAL_COMMUNITY): Payer: Self-pay

## 2022-03-12 NOTE — Progress Notes (Unsigned)
[place] [address] [place] Phone: [phone] Subjective:   [name], am serving as a scribe for Dr. Hulan Saas.  I'm seeing this patient by the request  of:  Midge Minium, MD  CC: back and neck pain follow up   [id number]  ANDERIA LORENZO is a 21 y.o. female coming in with complaint of back and neck pain. OMT 12/14/2021. Patient states same per usual. No new complaints.  Has been tighter than usual secondary to patient not being seen for some time.  Patient does have meloxicam which she has used intermittently as well as the muscle relaxer.  More tightness in the upper back.  Medications patient has been prescribed: Zanaflex  Taking:         Reviewed prior external information including notes and imaging from previsou exam, outside providers and external EMR if available.   As well as notes that were available from care everywhere and other healthcare systems.  Past medical history, social, surgical and family history all reviewed in electronic medical record.  No pertanent information unless stated regarding to the chief complaint.   Past Medical History:  Diagnosis Date   Allergy    Anxiety    Depression    PCOS (polycystic ovarian syndrome) 11/2016    No Known Allergies   Review of Systems:  No headache, visual changes, nausea, vomiting, diarrhea, constipation, dizziness, abdominal pain, skin rash, fevers, chills, night sweats, weight loss, swollen lymph nodes, body aches, joint swelling, chest pain, shortness of breath, mood changes. POSITIVE muscle aches  Objective  Blood pressure 124/86, pulse (!) 115, height '5\' 6"'$  (1.676 m), weight 206 lb (93.4 kg), SpO2 98 %.   General: No apparent distress alert and oriented x3 mood and affect normal, dressed appropriately.  HEENT: Pupils equal, extraocular movements intact  Respiratory: Patient's speak in full sentences and does not appear short of  breath  Cardiovascular: No lower extremity edema, non tender, no erythema  Neck exam does have some loss of lordosis.  Patient does have significant tightness on the right side of the parascapular region noted.  Some mild scapular dyskinesis noted.  Patient also has what appears to be significant more tightness with FABER test right greater than left.  Negative straight leg test noted.  Osteopathic findings  C6 flexed rotated and side bent left C7 flexed rotated and side bent right T3 extended rotated and side bent right inhaled rib T5 extended rotated and side bent left T7 extended rotated and side bent right T9 extended rotated and side bent left L2 flexed rotated and side bent right Sacrum right on right       Assessment and Plan:  Neck muscle spasm Seems to be more secondary to poor posture and ergonomics.  Patient does have significant amount of breast tissue that could also be pulling in the area that causes more discomfort and pain.  Patient also has some tightness noted with certain range of motion in the sacroiliac joints.  Has responded well to certain activities.  Follow-up again in 6 to 8 weeks    Nonallopathic problems  Decision today to treat with OMT was based on Physical Exam  After verbal consent patient was treated with HVLA, ME, FPR techniques in cervical, rib, thoracic, lumbar, and sacral  areas  Patient tolerated the procedure well with improvement in symptoms  Patient given exercises, stretches and lifestyle modifications  See medications in patient instructions if given  Patient  will follow up in 4-8 weeks     The above documentation has been reviewed and is accurate and complete [name], DO         Note: This dictation was prepared with Dragon dictation along with smaller phrase technology. Any transcriptional errors that result from this process are unintentional.

## 2022-03-13 ENCOUNTER — Ambulatory Visit: Payer: 59 | Admitting: Family Medicine

## 2022-03-13 VITALS — BP 124/86 | HR 115 | Ht 66.0 in | Wt 206.0 lb

## 2022-03-13 DIAGNOSIS — H9313 Tinnitus, bilateral: Secondary | ICD-10-CM | POA: Diagnosis not present

## 2022-03-13 DIAGNOSIS — H9201 Otalgia, right ear: Secondary | ICD-10-CM | POA: Diagnosis not present

## 2022-03-13 DIAGNOSIS — M9903 Segmental and somatic dysfunction of lumbar region: Secondary | ICD-10-CM

## 2022-03-13 DIAGNOSIS — M9908 Segmental and somatic dysfunction of rib cage: Secondary | ICD-10-CM

## 2022-03-13 DIAGNOSIS — M9901 Segmental and somatic dysfunction of cervical region: Secondary | ICD-10-CM | POA: Diagnosis not present

## 2022-03-13 DIAGNOSIS — M9904 Segmental and somatic dysfunction of sacral region: Secondary | ICD-10-CM

## 2022-03-13 DIAGNOSIS — M9902 Segmental and somatic dysfunction of thoracic region: Secondary | ICD-10-CM | POA: Diagnosis not present

## 2022-03-13 DIAGNOSIS — M62838 Other muscle spasm: Secondary | ICD-10-CM

## 2022-03-13 DIAGNOSIS — M26631 Articular disc disorder of right temporomandibular joint: Secondary | ICD-10-CM | POA: Diagnosis not present

## 2022-03-13 DIAGNOSIS — H9041 Sensorineural hearing loss, unilateral, right ear, with unrestricted hearing on the contralateral side: Secondary | ICD-10-CM | POA: Diagnosis not present

## 2022-03-13 NOTE — Patient Instructions (Signed)
Good to see you! That was a little long

## 2022-03-13 NOTE — Assessment & Plan Note (Signed)
Seems to be more secondary to poor posture and ergonomics.  Patient does have significant amount of breast tissue that could also be pulling in the area that causes more discomfort and pain.  Patient also has some tightness noted with certain range of motion in the sacroiliac joints.  Has responded well to certain activities.  Follow-up again in 6 to 8 weeks

## 2022-03-21 ENCOUNTER — Ambulatory Visit: Payer: 59 | Admitting: Family Medicine

## 2022-04-06 DIAGNOSIS — F419 Anxiety disorder, unspecified: Secondary | ICD-10-CM | POA: Diagnosis not present

## 2022-04-10 ENCOUNTER — Ambulatory Visit: Payer: 59 | Admitting: Sports Medicine

## 2022-04-10 ENCOUNTER — Other Ambulatory Visit (HOSPITAL_COMMUNITY): Payer: Self-pay

## 2022-04-10 VITALS — BP 110/78 | HR 108 | Ht 66.0 in | Wt 206.0 lb

## 2022-04-10 DIAGNOSIS — S46812A Strain of other muscles, fascia and tendons at shoulder and upper arm level, left arm, initial encounter: Secondary | ICD-10-CM

## 2022-04-10 DIAGNOSIS — M62838 Other muscle spasm: Secondary | ICD-10-CM

## 2022-04-10 DIAGNOSIS — M542 Cervicalgia: Secondary | ICD-10-CM

## 2022-04-10 MED ORDER — MELOXICAM 15 MG PO TABS
15.0000 mg | ORAL_TABLET | Freq: Every day | ORAL | 0 refills | Status: DC
  Filled 2022-04-10: qty 30, 30d supply, fill #0

## 2022-04-10 MED ORDER — METHYLPREDNISOLONE ACETATE 80 MG/ML IJ SUSP
80.0000 mg | Freq: Once | INTRAMUSCULAR | Status: AC
  Administered 2022-04-10: 80 mg via INTRAMUSCULAR

## 2022-04-10 MED ORDER — KETOROLAC TROMETHAMINE 60 MG/2ML IM SOLN
60.0000 mg | Freq: Once | INTRAMUSCULAR | Status: AC
  Administered 2022-04-10: 60 mg via INTRAMUSCULAR

## 2022-04-10 NOTE — Patient Instructions (Addendum)
Good to see you   You are experiencing a flare of left trapezius, levator scapula, rhomboid.  -Starting tomorrow meloxicam 15 mg daily x2 weeks.Do not to use additional NSAIDs while taking meloxicam.  May use Tylenol [phone] mg 2 to 3 times a day for breakthrough pain. - May use Zanaflex 2 mg nightly as needed for muscle spasms.  Can increase to 4 mg if needed. - Start HEP for neck and trap - IM injection of Toradol 60 mg and methylprednisone 80 mg provided today in clinic  Follow-up in 2 weeks

## 2022-04-10 NOTE — Progress Notes (Signed)
Cheryl [name] D.[place] Phone: [phone]   Assessment and Plan:     1. Neck muscle spasm 2. Neck pain 3. Strain of left trapezius muscle, initial encounter -Chronic with exacerbation, initial sports medicine visit - Recurrent flare of neck pain with significant worsening over the past 24 hours with patient sleeping on the couch over the weekend.  Most consistent with strains of left trapezius, left levator scapula, left rhomboid - Start HEP focusing on neck, trapezius, levator scapula, rhomboid - Start meloxicam 15 mg daily x2 weeks.  If still having pain after 2 weeks, complete 3rd-week of meloxicam. May use remaining meloxicam as needed once daily for pain control.  Do not to use additional NSAIDs while taking meloxicam.  May use Tylenol [phone] mg 2 to 3 times a day for breakthrough pain. - Continue Zanaflex 2 to 4 mg nightly as needed for muscle spasms - Due to acute pain, patient elected for methylprednisone 80 mg/ketorolac 60 mg IM injection at today's visit.  Advised to not start meloxicam until tomorrow - No red flag symptoms on physical exam, and no trauma, so no imaging performed today    Other orders - meloxicam (MOBIC) 15 MG tablet; Take 1 tablet (15 mg total) by mouth daily.     Pertinent previous records reviewed include none   Follow Up: 2 weeks for reevaluation.  Could consider trigger point injections versus OMT versus x-ray based on symptoms   Subjective:   I, Moenique Parris, am serving as a Education administrator for Doctor Glennon Mac  Chief Complaint: neck pain   HPI:   04/10/22 Patient is a 21 year old female complaining of neck pain. Patient states that she slept in a weird position this morning , has had it  maybe twice before , dd have some tingling going on but nothing crazy, has been using ice and heat, decreased ROM, does radiate down to her shoulder blades, Zanaflex , tramadol, and  advil today  and these have helped a bunch , has pain when she moves hard to hold head ,   Relevant Historical Information: History of   Additional pertinent review of systems negative.   Current Outpatient Medications:    buPROPion (WELLBUTRIN XL) 300 MG 24 hr tablet, Take 1 tablet (300 mg total) by mouth daily., Disp: 90 tablet, Rfl: 1   cycloSPORINE (RESTASIS) 0.05 % ophthalmic emulsion, Instill 1 drop into both eyes twice a day, Disp: 180 each, Rfl: 3   EPINEPHrine 0.3 mg/0.3 mL IJ SOAJ injection, , Disp: , Rfl: 1   etonogestrel-ethinyl estradiol (NUVARING) 0.12-0.015 MG/24HR vaginal ring, INSERT 1 RING VAGINALLY ONCE EVERY 4 WEEKS; LEAVE IN FOR 3 WEEKS AND REMOVE FOR 1 WEEK AS DIRECTED BY PROVIDER, Disp: 3 each, Rfl: 6   FLUoxetine (PROZAC) 40 MG capsule, TAKE 1 CAPSULE BY MOUTH ONCE A DAY, Disp: 90 capsule, Rfl: 1   fluticasone (FLONASE) 50 MCG/ACT nasal spray, INSTILL 1 TO 2 SPRAYS INTO BOTH NOSTRILS DAILY, Disp: 16 g, Rfl: 3   loratadine-pseudoephedrine (CLARITIN-D 24 HOUR) 10-240 MG 24 hr tablet, Take 1 tablet by mouth daily., Disp: 30 tablet, Rfl: 0   ondansetron (ZOFRAN) 4 MG tablet, Take 1 tablet (4 mg total) by mouth every 8 (eight) hours as needed for nausea or vomiting., Disp: 20 tablet, Rfl: 1   phentermine (ADIPEX-P) 37.5 MG tablet, Take 1 tablet by mouth daily in the morning., Disp: 30 tablet, Rfl: 2   phentermine (  ADIPEX-P) 37.5 MG tablet, Take 1 tablet by mouth daily in the morning., Disp: 30 tablet, Rfl: 2   tiZANidine (ZANAFLEX) 2 MG tablet, TAKE 1 TABLET BY MOUTH AT BEDTIME, Disp: 30 tablet, Rfl: 0   traZODone (DESYREL) 50 MG tablet, TAKE 1/2 TO 1 TABLET BY MOUTH AT BEDTIME AS NEEDED FOR SLEEP., Disp: 30 tablet, Rfl: 3   albuterol (VENTOLIN HFA) 108 (90 Base) MCG/ACT inhaler, INHALE 2 PUFFS INTO THE LUNGS EVERY 6 (SIX) HOURS AS NEEDED FOR WHEEZING OR SHORTNESS OF BREATH., Disp: 18 g, Rfl: 0   meloxicam (MOBIC) 15 MG tablet, Take 1 tablet (15 mg total) by mouth daily.,  Disp: 30 tablet, Rfl: 0   Objective:     Vitals:   04/10/22 1304  BP: 110/78  Pulse: (!) 108  SpO2: 98%  Weight: 206 lb (93.4 kg)  Height: '5\' 6"'$  (1.676 m)      Body mass index is 33.25 kg/m.    Physical Exam:    Cervical Spine: Posture normal Skin: normal, intact  Neurological:   Strength:  Right  Left   Deltoid 5/5 5/5  Bicep 5/5  5/5  Tricep 5/5 5/5  Wrist Flexion 5/5 5/5  Wrist Extension 5/5 5/5  Grip 5/5 5/5  Finger Abduction 5/5 5/5   Sensation: intact to light touch in upper extremities bilaterally  Spurling's:  negative bilaterally Neck ROM: Decreased active ROM in all directions due to left-sided neck pain TTP: Left-sided cervical paraspinal, left thoracic paraspinal, left trapezius, left rhomboid left levator NTTP: cervical spinous processes, right cervical paraspinal, right thoracic paraspinal, right trapezius    Electronically signed by:  Cheryl [name] D.[facility] 1:49 PM 04/10/22

## 2022-04-12 NOTE — Progress Notes (Signed)
[place] Phone: [phone] Subjective:   [name], am serving as a scribe for Dr. Hulan Saas.\  I'm seeing this patient by the request  of:  Midge Minium, MD  CC: Back and neck pain follow-up  [id number]  Cheryl [name] is a 21 y.o. female coming in with complaint of back and neck pain. OMT 03/13/2022. Patient states that she saw Dr. Glennon Mac last week for her neck. Had injections in backside which helped. Otherwise pain has been same since last visit.   Medications patient has been prescribed: Zanaflex, Meloxicam   Taking:         Reviewed prior external information including notes and imaging from previsou exam, outside providers and external EMR if available.   As well as notes that were available from care everywhere and other healthcare systems.  Past medical history, social, surgical and family history all reviewed in electronic medical record.  No pertanent information unless stated regarding to the chief complaint.   Past Medical History:  Diagnosis Date   Allergy    Anxiety    Depression    PCOS (polycystic ovarian syndrome) 11/2016    No Known Allergies   Review of Systems:  No headache, visual changes, nausea, vomiting, diarrhea, constipation, dizziness, abdominal pain, skin rash, fevers, chills, night sweats, weight loss, swollen lymph nodes, body aches, joint swelling, chest pain, shortness of breath, mood changes. POSITIVE muscle aches  Objective  Blood pressure 108/82, pulse 80, height '5\' 6"'$  (1.676 m), weight 205 lb (93 kg), SpO2 99 %.   General: No apparent distress alert and oriented x3 mood and affect normal, dressed appropriately.  HEENT: Pupils equal, extraocular movements intact  Respiratory: Patient's speak in full sentences and does not appear short of breath  Cardiovascular: No lower extremity edema, non tender, no erythema  Neck exam does have some  increasing discomfort on the right side.  Paraspinal musculature is significantly tight  Osteopathic findings  C3 flexed rotated and side bent right C4 flexed rotated and side bent right T4 extended rotated and side bent right inhaled rib       Assessment and Plan:  Neck muscle spasm Patient was seen for by another provider and was given some injections that would have been helpful.  We discussed taking the Zanaflex 4 mg at night more regularly.  Meloxicam 15 mg does have during the day.  We have tried trazodone as well secondary to the underlying anxiety aspect that could be contributing to some of the increasing neck pain.  Discussed with patient about icing regimen, ergonomics, which activities to do and which ones to avoid.  Follow-up again in 6 weeks  Patellar subluxation, left, initial encounter Patient did bring up the idea concerning for repetitive in the discomfort.  We discussed again maybe we discussed VMO strengthening    Nonallopathic problems  Decision today to treat with OMT was based on Physical Exam  After verbal consent patient was treated with HVLA, ME, FPR techniques in cervical, rib, thoracic  areas  Patient tolerated the procedure well with improvement in symptoms  Patient given exercises, stretches and lifestyle modifications  See medications in patient instructions if given  Patient will follow up in 4-8 weeks     The above documentation has been reviewed and is accurate and complete [name], DO         Note: This dictation was prepared with Dragon dictation along with  smaller phrase technology. Any transcriptional errors that result from this process are unintentional.

## 2022-04-13 ENCOUNTER — Other Ambulatory Visit (HOSPITAL_COMMUNITY): Payer: Self-pay

## 2022-04-16 ENCOUNTER — Other Ambulatory Visit (HOSPITAL_COMMUNITY): Payer: Self-pay

## 2022-04-16 ENCOUNTER — Encounter: Payer: Self-pay | Admitting: Family Medicine

## 2022-04-16 ENCOUNTER — Ambulatory Visit (INDEPENDENT_AMBULATORY_CARE_PROVIDER_SITE_OTHER): Payer: 59 | Admitting: Family Medicine

## 2022-04-16 VITALS — BP 108/82 | HR 80 | Ht 66.0 in | Wt 205.0 lb

## 2022-04-16 DIAGNOSIS — M62838 Other muscle spasm: Secondary | ICD-10-CM

## 2022-04-16 DIAGNOSIS — D2261 Melanocytic nevi of right upper limb, including shoulder: Secondary | ICD-10-CM | POA: Diagnosis not present

## 2022-04-16 DIAGNOSIS — S83002A Unspecified subluxation of left patella, initial encounter: Secondary | ICD-10-CM

## 2022-04-16 DIAGNOSIS — M9908 Segmental and somatic dysfunction of rib cage: Secondary | ICD-10-CM

## 2022-04-16 DIAGNOSIS — M9902 Segmental and somatic dysfunction of thoracic region: Secondary | ICD-10-CM | POA: Diagnosis not present

## 2022-04-16 DIAGNOSIS — D1801 Hemangioma of skin and subcutaneous tissue: Secondary | ICD-10-CM | POA: Diagnosis not present

## 2022-04-16 DIAGNOSIS — L309 Dermatitis, unspecified: Secondary | ICD-10-CM | POA: Diagnosis not present

## 2022-04-16 DIAGNOSIS — D225 Melanocytic nevi of trunk: Secondary | ICD-10-CM | POA: Diagnosis not present

## 2022-04-16 DIAGNOSIS — M9901 Segmental and somatic dysfunction of cervical region: Secondary | ICD-10-CM | POA: Diagnosis not present

## 2022-04-16 DIAGNOSIS — L280 Lichen simplex chronicus: Secondary | ICD-10-CM | POA: Diagnosis not present

## 2022-04-16 DIAGNOSIS — L812 Freckles: Secondary | ICD-10-CM | POA: Diagnosis not present

## 2022-04-16 DIAGNOSIS — D2271 Melanocytic nevi of right lower limb, including hip: Secondary | ICD-10-CM | POA: Diagnosis not present

## 2022-04-16 DIAGNOSIS — L738 Other specified follicular disorders: Secondary | ICD-10-CM | POA: Diagnosis not present

## 2022-04-16 MED ORDER — CLINDAMYCIN PHOSPHATE 1 % EX LOTN
TOPICAL_LOTION | CUTANEOUS | 2 refills | Status: DC
  Filled 2022-04-16: qty 60, 30d supply, fill #0
  Filled 2022-06-05: qty 60, 15d supply, fill #0

## 2022-04-16 MED ORDER — MUPIROCIN 2 % EX OINT
TOPICAL_OINTMENT | CUTANEOUS | 2 refills | Status: DC
  Filled 2022-04-16: qty 22, 7d supply, fill #0
  Filled 2022-06-05: qty 22, 15d supply, fill #0

## 2022-04-16 NOTE — Patient Instructions (Signed)
L metatarsal pad See me again 5-6 weeks

## 2022-04-16 NOTE — Assessment & Plan Note (Signed)
Patient did bring up the idea concerning for repetitive in the discomfort.  We discussed again maybe we discussed VMO strengthening

## 2022-04-16 NOTE — Assessment & Plan Note (Signed)
Patient was seen for by another provider and was given some injections that would have been helpful.  We discussed taking the Zanaflex 4 mg at night more regularly.  Meloxicam 15 mg does have during the day.  We have tried trazodone as well secondary to the underlying anxiety aspect that could be contributing to some of the increasing neck pain.  Discussed with patient about icing regimen, ergonomics, which activities to do and which ones to avoid.  Follow-up again in 6 weeks

## 2022-05-02 ENCOUNTER — Other Ambulatory Visit (HOSPITAL_COMMUNITY): Payer: Self-pay

## 2022-05-04 DIAGNOSIS — F419 Anxiety disorder, unspecified: Secondary | ICD-10-CM | POA: Diagnosis not present

## 2022-05-09 NOTE — Progress Notes (Unsigned)
[name] [place] Phone: [phone] Subjective:   Cheryl [name], am serving as a scribe for Dr. Hulan Saas.  I'm seeing this patient by the request  of:  Midge Minium, MD  CC: Neck and back pain follow-up  [id number]  Cheryl [name] is a 21 y.o. female coming in with complaint of back and neck pain. OMT 04/16/2022. Patient states has done relatively well.  Still some tenderness on the right side of her lower back.  Not stopping her from activity but continues to give her discomfort.  Medications patient has been prescribed: Zanaflex  Taking: Zanaflex          Reviewed prior external information including notes and imaging from previsou exam, outside providers and external EMR if available.   As well as notes that were available from care everywhere and other healthcare systems.  Past medical history, social, surgical and family history all reviewed in electronic medical record.  No pertanent information unless stated regarding to the chief complaint.   Past Medical History:  Diagnosis Date   Allergy    Anxiety    Depression    PCOS (polycystic ovarian syndrome) 11/2016    No Known Allergies   Review of Systems:  No headache, visual changes, nausea, vomiting, diarrhea, constipation, dizziness, abdominal pain, skin rash, fevers, chills, night sweats, weight loss, swollen lymph nodes, body aches, joint swelling, chest pain, shortness of breath, mood changes. POSITIVE muscle aches  Objective  Blood pressure 124/84, pulse 99, height '5\' 6"'$  (1.676 m), weight 205 lb (93 kg), SpO2 97 %.   General: No apparent distress alert and oriented x3 mood and affect normal, dressed appropriately.  HEENT: Pupils equal, extraocular movements intact  Respiratory: Patient's speak in full sentences and does not appear short of breath  Cardiovascular: No lower extremity edema, non tender, no erythema  Neck  exam does have some loss of lordosis.  Tightness still noted to the trapezius bilaterally right greater than left.  Osteopathic findings  C2 flexed rotated and side bent right C6 flexed rotated and side bent right  T3 extended rotated and side bent right inhaled rib T9 extended rotated and side bent left L2 flexed rotated and side bent right Sacrum right on right       Assessment and Plan:  SI (sacroiliac) joint dysfunction Continuing to work on core strength.  Discussed posture and ergonomics, which activities to do and which ones to avoid, increase activity slowly over the course of next several weeks.  Discussed icing regimen.  Increase activity slowly.  He is responding well to osteopathic manipulation.  Does have the Zanaflex 4 mg to take at night as needed.  Follow-up again in 6 to 8 weeks    Nonallopathic problems  Decision today to treat with OMT was based on Physical Exam  After verbal consent patient was treated with HVLA, ME, FPR techniques in cervical, rib, thoracic, lumbar, and sacral  areas  Patient tolerated the procedure well with improvement in symptoms  Patient given exercises, stretches and lifestyle modifications  See medications in patient instructions if given  Patient will follow up in 4-8 weeks     The above documentation has been reviewed and is accurate and complete [name], DO         Note: This dictation was prepared with Dragon dictation along with smaller phrase technology. Any transcriptional errors that result from this process are unintentional.

## 2022-05-11 ENCOUNTER — Other Ambulatory Visit (HOSPITAL_COMMUNITY): Payer: Self-pay

## 2022-05-15 ENCOUNTER — Ambulatory Visit: Payer: 59 | Admitting: Family Medicine

## 2022-05-15 VITALS — BP 124/84 | HR 99 | Ht 66.0 in | Wt 205.0 lb

## 2022-05-15 DIAGNOSIS — M9904 Segmental and somatic dysfunction of sacral region: Secondary | ICD-10-CM | POA: Diagnosis not present

## 2022-05-15 DIAGNOSIS — M533 Sacrococcygeal disorders, not elsewhere classified: Secondary | ICD-10-CM | POA: Diagnosis not present

## 2022-05-15 DIAGNOSIS — M9903 Segmental and somatic dysfunction of lumbar region: Secondary | ICD-10-CM

## 2022-05-15 DIAGNOSIS — M9901 Segmental and somatic dysfunction of cervical region: Secondary | ICD-10-CM | POA: Diagnosis not present

## 2022-05-15 DIAGNOSIS — M9908 Segmental and somatic dysfunction of rib cage: Secondary | ICD-10-CM

## 2022-05-15 DIAGNOSIS — M9902 Segmental and somatic dysfunction of thoracic region: Secondary | ICD-10-CM | POA: Diagnosis not present

## 2022-05-15 NOTE — Assessment & Plan Note (Signed)
Continuing to work on core strength.  Discussed posture and ergonomics, which activities to do and which ones to avoid, increase activity slowly over the course of next several weeks.  Discussed icing regimen.  Increase activity slowly.  He is responding well to osteopathic manipulation.  Does have the Zanaflex 4 mg to take at night as needed.  Follow-up again in 6 to 8 weeks

## 2022-05-15 NOTE — Patient Instructions (Addendum)
Good to see you Coop pillow  Have fun with doggy  See you at next visit

## 2022-06-01 DIAGNOSIS — F419 Anxiety disorder, unspecified: Secondary | ICD-10-CM | POA: Diagnosis not present

## 2022-06-04 NOTE — Progress Notes (Deleted)
  [place] Phone: [phone] Subjective:    I'm seeing this patient by the request  of:  Midge Minium, MD  CC: back and neck pain follow up   [id number]  Cheryl [name] is a 21 y.o. female coming in with complaint of back and neck pain. OMT 05/15/2022. Patient states   Medications patient has been prescribed: Zanaflex  Taking:         Reviewed prior external information including notes and imaging from previsou exam, outside providers and external EMR if available.   As well as notes that were available from care everywhere and other healthcare systems.  Past medical history, social, surgical and family history all reviewed in electronic medical record.  No pertanent information unless stated regarding to the chief complaint.   Past Medical History:  Diagnosis Date   Allergy    Anxiety    Depression    PCOS (polycystic ovarian syndrome) 11/2016    No Known Allergies   Review of Systems:  No headache, visual changes, nausea, vomiting, diarrhea, constipation, dizziness, abdominal pain, skin rash, fevers, chills, night sweats, weight loss, swollen lymph nodes, body aches, joint swelling, chest pain, shortness of breath, mood changes. POSITIVE muscle aches  Objective  There were no vitals taken for this visit.   General: No apparent distress alert and oriented x3 mood and affect normal, dressed appropriately.  HEENT: Pupils equal, extraocular movements intact  Respiratory: Patient's speak in full sentences and does not appear short of breath  Cardiovascular: No lower extremity edema, non tender, no erythema  MSK:  Back   Osteopathic findings  C2 flexed rotated and side bent right C6 flexed rotated and side bent left T3 extended rotated and side bent right inhaled rib T5 extended rotated and side bent left L2 flexed rotated and side bent right Sacrum right on right        Assessment and Plan:  No problem-specific Assessment & Plan notes found for this encounter.    Nonallopathic problems  Decision today to treat with OMT was based on Physical Exam  After verbal consent patient was treated with HVLA, ME, FPR techniques in cervical, rib, thoracic, lumbar, and sacral  areas  Patient tolerated the procedure well with improvement in symptoms  Patient given exercises, stretches and lifestyle modifications  See medications in patient instructions if given  Patient will follow up in 4-8 weeks    The above documentation has been reviewed and is accurate and complete [name], DO          Note: This dictation was prepared with Dragon dictation along with smaller phrase technology. Any transcriptional errors that result from this process are unintentional.

## 2022-06-05 ENCOUNTER — Other Ambulatory Visit: Payer: Self-pay | Admitting: Family Medicine

## 2022-06-05 ENCOUNTER — Other Ambulatory Visit (HOSPITAL_COMMUNITY): Payer: Self-pay

## 2022-06-05 NOTE — Telephone Encounter (Signed)
Patient is requesting a refill of the following medications: Requested Prescriptions   Pending Prescriptions Disp Refills   traZODone (DESYREL) 50 MG tablet 30 tablet 3    Sig: TAKE 1/2 TO 1 TABLET BY MOUTH AT BEDTIME AS NEEDED FOR SLEEP.    Date of patient request: 06/05/22 Last office visit: 10/26/21 Date of last refill: 11/08/21 Last refill amount: 30

## 2022-06-06 ENCOUNTER — Other Ambulatory Visit (HOSPITAL_COMMUNITY): Payer: Self-pay

## 2022-06-06 MED ORDER — TRAZODONE HCL 50 MG PO TABS
25.0000 mg | ORAL_TABLET | Freq: Every evening | ORAL | 3 refills | Status: DC | PRN
  Filled 2022-06-06: qty 30, 30d supply, fill #0
  Filled 2022-08-29 – 2022-08-30 (×2): qty 30, 30d supply, fill #1
  Filled 2022-09-24: qty 30, 30d supply, fill #2
  Filled 2023-02-12: qty 30, 30d supply, fill #3

## 2022-06-07 ENCOUNTER — Ambulatory Visit: Payer: 59 | Admitting: Family Medicine

## 2022-06-07 DIAGNOSIS — F419 Anxiety disorder, unspecified: Secondary | ICD-10-CM | POA: Diagnosis not present

## 2022-06-08 ENCOUNTER — Other Ambulatory Visit (HOSPITAL_COMMUNITY): Payer: Self-pay

## 2022-06-28 DIAGNOSIS — F419 Anxiety disorder, unspecified: Secondary | ICD-10-CM | POA: Diagnosis not present

## 2022-07-17 ENCOUNTER — Ambulatory Visit (INDEPENDENT_AMBULATORY_CARE_PROVIDER_SITE_OTHER): Payer: Commercial Managed Care - PPO | Admitting: Family Medicine

## 2022-07-17 ENCOUNTER — Other Ambulatory Visit (HOSPITAL_COMMUNITY): Payer: Self-pay

## 2022-07-17 ENCOUNTER — Encounter: Payer: Self-pay | Admitting: Family Medicine

## 2022-07-17 VITALS — BP 126/76 | HR 88 | Temp 98.3°F | Resp 16 | Ht 66.0 in | Wt 206.1 lb

## 2022-07-17 DIAGNOSIS — K5900 Constipation, unspecified: Secondary | ICD-10-CM | POA: Diagnosis not present

## 2022-07-17 DIAGNOSIS — F411 Generalized anxiety disorder: Secondary | ICD-10-CM

## 2022-07-17 MED ORDER — ALPRAZOLAM 0.5 MG PO TABS
0.5000 mg | ORAL_TABLET | Freq: Two times a day (BID) | ORAL | 1 refills | Status: DC | PRN
  Filled 2022-07-17: qty 30, 15d supply, fill #0
  Filled 2022-12-03 – 2022-12-14 (×2): qty 30, 15d supply, fill #1

## 2022-07-17 MED ORDER — ONDANSETRON HCL 4 MG PO TABS
4.0000 mg | ORAL_TABLET | Freq: Three times a day (TID) | ORAL | 1 refills | Status: DC | PRN
  Filled 2022-07-17: qty 20, 7d supply, fill #0
  Filled 2023-03-15: qty 20, 7d supply, fill #1

## 2022-07-17 NOTE — Patient Instructions (Signed)
Please schedule a lab visit at your convenience Start OTC Miralax once daily in 8 oz of fluid Continue to drink LOTS of water Try and get regular physical activity to keep bowels moving Call with any questions or concerns Stay Safe!  Stay Healthy! Hang in there!!

## 2022-07-17 NOTE — Progress Notes (Unsigned)
   Subjective:    Patient ID: Cheryl [name], female    DOB: [birth year], 22 y.o.   MRN: [id number]  HPI GAD- ongoing issue for pt.  Currently on Wellbutrin XL '300mg'$  daily, Fluoxetine '40mg'$  daily, and Alprazolam 0.'5mg'$  PRN.  Pt doesn't have any concerns regarding medications.  Pt knows she has a harder time in the winter due to lack of sunlight, anniversary of dad's death, and the holidays.  Is currently in trauma based counseling.  Is using less Xanax since starting treatment.    Constipation- pt reports ~10 days of constipation.  Started OTC Magnesium last week w/o relief.  Had to take a laxative last week to move bowels.  'i lost 4-5 lbs'.  Pt reports she has had abdominal pain, bloating, distention.  Pain is described as an ache in the RLQ and RUQ.  Pt reports no change in medication, diet.  Pt reports good water intake.     Review of Systems For ROS see HPI     Objective:   Physical Exam Vitals reviewed.  Constitutional:      General: She is not in acute distress.    Appearance: Normal appearance. She is obese. She is not ill-appearing.  HENT:     Head: Normocephalic and atraumatic.  Cardiovascular:     Rate and Rhythm: Normal rate and regular rhythm.     Pulses: Normal pulses.     Heart sounds: Normal heart sounds.  Pulmonary:     Effort: Pulmonary effort is normal. No respiratory distress.     Breath sounds: No wheezing or rhonchi.  Abdominal:     General: There is no distension.     Palpations: Abdomen is soft.     Tenderness: There is abdominal tenderness (mild RUQ TTP). There is no guarding or rebound.  Skin:    General: Skin is warm and dry.  Neurological:     General: No focal deficit present.     Mental Status: She is alert and oriented to person, place, and time.  Psychiatric:        Mood and Affect: Mood normal.        Behavior: Behavior normal.        Thought Content: Thought content normal.           Assessment & Plan:  Constipation- new.  Pt reports  she has never had an issue that wasn't able to be resolved w/ coffee.  Starting 10 days ago, she developed constipation, abd cramping, bloating.  She states she has been unable to move bowels w/o using a laxative (x2).  She denies changes to medication, diet, stress levels.  Will start Miralax OTC and explained that she can increase or decrease doses based on bowel movements.  Encouraged increased water intake and regular physical activity.  Pt expressed understanding and is in agreement w/ plan.

## 2022-07-18 NOTE — Assessment & Plan Note (Signed)
Stable.  Pt feels that entering trauma based counseling and doing EMDR has been helpful.  She is not interested in changing medications at this time.  Will continue Wellbutrin XL '300mg'$  daily, Fluoxetine '40mg'$  daily, and Alprazolam 0.'5mg'$  prn.  Refill provided on Alprazolam.

## 2022-07-19 ENCOUNTER — Other Ambulatory Visit: Payer: Self-pay

## 2022-07-19 ENCOUNTER — Other Ambulatory Visit (INDEPENDENT_AMBULATORY_CARE_PROVIDER_SITE_OTHER): Payer: Self-pay

## 2022-07-19 DIAGNOSIS — K5900 Constipation, unspecified: Secondary | ICD-10-CM

## 2022-07-19 LAB — BASIC METABOLIC PANEL
BUN: 10 mg/dL (ref 6–23)
CO2: 28 mEq/L (ref 19–32)
Calcium: 9.3 mg/dL (ref 8.4–10.5)
Chloride: 101 mEq/L (ref 96–112)
Creatinine, Ser: 0.87 mg/dL (ref 0.40–1.20)
GFR: 93.88 mL/min (ref >60.00)
Glucose, Bld: 80 mg/dL (ref 70–99)
Potassium: 4.2 mEq/L (ref 3.5–5.1)
Sodium: 135 mEq/L (ref 135–145)

## 2022-07-19 LAB — HEPATIC FUNCTION PANEL
ALT: 14 U/L (ref 0–35)
AST: 16 U/L (ref 0–37)
Albumin: 4.1 g/dL (ref 3.5–5.2)
Alkaline Phosphatase: 41 U/L (ref 39–117)
Bilirubin, Direct: 0.1 mg/dL (ref 0.0–0.3)
Total Bilirubin: 0.3 mg/dL (ref 0.2–1.2)
Total Protein: 7.7 g/dL (ref 6.0–8.3)

## 2022-07-19 LAB — TSH: TSH: 2.96 u[IU]/mL (ref 0.35–5.50)

## 2022-07-20 DIAGNOSIS — F419 Anxiety disorder, unspecified: Secondary | ICD-10-CM | POA: Diagnosis not present

## 2022-08-16 ENCOUNTER — Other Ambulatory Visit (HOSPITAL_COMMUNITY): Payer: Self-pay

## 2022-08-16 ENCOUNTER — Encounter: Payer: Self-pay | Admitting: Family Medicine

## 2022-08-16 ENCOUNTER — Telehealth (INDEPENDENT_AMBULATORY_CARE_PROVIDER_SITE_OTHER): Payer: Commercial Managed Care - PPO | Admitting: Family Medicine

## 2022-08-16 VITALS — Ht 66.0 in | Wt 200.0 lb

## 2022-08-16 DIAGNOSIS — R058 Other specified cough: Secondary | ICD-10-CM | POA: Diagnosis not present

## 2022-08-16 MED ORDER — GUAIFENESIN-CODEINE 100-10 MG/5ML PO SYRP
10.0000 mL | ORAL_SOLUTION | Freq: Three times a day (TID) | ORAL | 0 refills | Status: DC | PRN
  Filled 2022-08-16: qty 120, 4d supply, fill #0

## 2022-08-16 MED ORDER — DOXYCYCLINE HYCLATE 100 MG PO TABS
100.0000 mg | ORAL_TABLET | Freq: Two times a day (BID) | ORAL | 0 refills | Status: DC
  Filled 2022-08-16: qty 20, 10d supply, fill #0

## 2022-08-16 MED ORDER — ALBUTEROL SULFATE HFA 108 (90 BASE) MCG/ACT IN AERS
2.0000 | INHALATION_SPRAY | Freq: Four times a day (QID) | RESPIRATORY_TRACT | 0 refills | Status: DC | PRN
  Filled 2022-08-16: qty 6.7, 30d supply, fill #0

## 2022-08-16 NOTE — Progress Notes (Signed)
Virtual Visit via Video   I connected with patient on 08/16/22 at 10:20 AM EST by a video enabled telemedicine application and verified that I am speaking with the correct person using two identifiers.  Location patient: Home Location provider: [facility] Persons participating in the virtual visit: Patient, Provider, [place] Marcille Blanco C)  I discussed the limitations of evaluation and management by telemedicine and the availability of in person appointments. The patient expressed understanding and agreed to proceed.  Subjective:   HPI:   Cough- pt reports she abruptly started not feeling well on weekend of 2/16.  Things seemed to improve last week, but then again yesterday developed cough and burning in her chest and pain w/ coughing.  Cough is intermittently productive of green sputum.  +HA, ear pain.  No fever.  No pain w/ taking a deep breath.  Denies SOB.  Denies wheezing.  No known sick contacts but was recently traveling.    ROS:   See pertinent positives and negatives per HPI.  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   Nonallopathic lesion of sacral region 07/14/2019   Nonallopathic lesion of lumbosacral region 07/14/2019   Nonallopathic lesion of thoracic region 07/14/2019   Medial tibial stress syndrome, right, initial encounter 07/14/2019   Palpitations 04/13/2019   PCOS (polycystic ovarian syndrome) 11/16/2016   Physical exam 11/15/2016   Amenorrhea 11/15/2016   Weight gain 11/15/2016   GAD (generalized anxiety disorder) 03/22/2015    Social History   Tobacco Use   Smoking status: Former    Types: Cigarettes    Quit date: 10/21/2017    Years since quitting: 4.8   Smokeless tobacco: Never   Substance Use Topics   Alcohol use: No    Alcohol/week: 0.0 standard drinks of alcohol    Current Outpatient Medications:    ALPRAZolam (XANAX) 0.5 MG tablet, Take 1 tablet (0.5 mg total) by mouth 2 (two) times daily as needed., Disp: 30 tablet, Rfl: 1   buPROPion (WELLBUTRIN XL) 300 MG 24 hr tablet, Take 1 tablet (300 mg total) by mouth daily., Disp: 90 tablet, Rfl: 1   clindamycin (CLEOCIN T) 1 % lotion, Apply liberally to affected area twice a day, Disp: 60 mL, Rfl: 2   cycloSPORINE (RESTASIS) 0.05 % ophthalmic emulsion, Instill 1 drop into both eyes twice a day, Disp: 180 each, Rfl: 3   EPINEPHrine 0.3 mg/0.3 mL IJ SOAJ injection, , Disp: , Rfl: 1   etonogestrel-ethinyl estradiol (NUVARING) 0.12-0.015 MG/24HR vaginal ring, INSERT 1 RING VAGINALLY ONCE EVERY 4 WEEKS; LEAVE IN FOR 3 WEEKS AND REMOVE FOR 1 WEEK AS DIRECTED BY PROVIDER, Disp: 3 each, Rfl: 6   FLUoxetine (PROZAC) 40 MG capsule, TAKE 1 CAPSULE BY MOUTH ONCE A DAY, Disp: 90 capsule, Rfl: 1   fluticasone (FLONASE) 50 MCG/ACT nasal spray, INSTILL 1 TO 2 SPRAYS INTO BOTH NOSTRILS DAILY, Disp: 16 g, Rfl: 3   loratadine-pseudoephedrine (CLARITIN-D 24 HOUR) 10-240 MG 24 hr tablet, Take 1 tablet by mouth daily., Disp: 30 tablet, Rfl: 0   meloxicam (MOBIC) 15 MG tablet, Take 1 tablet (15 mg total) by mouth daily., Disp: 30 tablet, Rfl: 0   methylphenidate (CONCERTA) 18 MG PO CR tablet, Take 1 tablet by mouth daily., Disp: , Rfl:  ondansetron (ZOFRAN) 4 MG tablet, Take 1 tablet (4 mg total) by mouth every 8 (eight) hours as needed for nausea or vomiting., Disp: 20 tablet, Rfl: 1   phentermine (ADIPEX-P) 37.5 MG tablet, Take 1 tablet by mouth daily in the morning., Disp: 30 tablet, Rfl: 2   tiZANidine (ZANAFLEX) 2 MG tablet, TAKE 1 TABLET BY MOUTH AT BEDTIME, Disp: 30 tablet, Rfl: 0   traZODone (DESYREL) 50 MG tablet, Take 0.5-1 tablets (25-50 mg total) by mouth at bedtime as needed for sleep, Disp: 30 tablet, Rfl: 3   albuterol  (VENTOLIN HFA) 108 (90 Base) MCG/ACT inhaler, INHALE 2 PUFFS INTO THE LUNGS EVERY 6 (SIX) HOURS AS NEEDED FOR WHEEZING OR SHORTNESS OF BREATH., Disp: 18 g, Rfl: 0  No Known Allergies  Objective:   Ht '5\' 6"'$  (1.676 m)   Wt 200 lb (90.7 kg)   BMI 32.28 kg/m  AAOx3, NAD NCAT, EOMI No obvious CN deficits Coloring WNL Pt is able to speak clearly, coherently without shortness of breath or increased work of breathing.  + dry cough No pain w/ deep breath, no TTP of chest Thought process is linear.  Mood is appropriate.   Assessment and Plan:   Productive cough- new.  Pt reports not feeling well while she was traveling but felt that sxs had improved and then suddenly developed cough, burning in chest, pain w/ coughing.  She denies SOB, no TTP over chest, no pain w/ deep breath- only w/ cough.  Will start Doxycycline for possible bacterial infection, albuterol inhaler to help w/ cough, and codeine cough syrup to help w/ cough and pain.  Encouraged tylenol and ibuprofen for pain relief.  Increased fluids.  Rest.  Pt to notify if sxs change or worsen.  Pt expressed understanding and is in agreement w/ plan.    Annye Asa, MD 08/16/2022

## 2022-08-24 NOTE — Progress Notes (Unsigned)
  Cheryl [name] [address] [place] Phone: [phone] Subjective:   [name], am serving as a scribe for Dr. Hulan Saas.  I'm seeing this patient by the request  of:  Midge Minium, MD  CC: back and neck   Cheryl [name]  Cheryl [name] is a 22 y.o. female coming in with complaint of back and neck pain. OMT 05/16/2023. Patient states same per usual. No new concerns.  Patient has been able to be active.  Will be traveling more and is concerned about that somewhat.  Medications patient has been prescribed: None  Taking:         Reviewed prior external information including notes and imaging from previsou exam, outside providers and external EMR if available.   As well as notes that were available from care everywhere and other healthcare systems.  Past medical history, social, surgical and family history all reviewed in electronic medical record.  No pertanent information unless stated regarding to the chief complaint.   Past Medical History:  Diagnosis Date   Allergy    Anxiety    Depression    PCOS (polycystic ovarian syndrome) 11/2016    No Known Allergies   Review of Systems:  No headache, visual changes, nausea, vomiting, diarrhea, constipation, dizziness, abdominal pain, skin rash, fevers, chills, night sweats, weight loss, swollen lymph nodes, body aches, joint swelling, chest pain, shortness of breath, mood changes. POSITIVE muscle aches  Objective  Blood pressure 116/72, pulse (!) 108, height 5\' 6"  (1.676 m), weight 209 lb (94.8 kg), SpO2 97 %.   General: No apparent distress alert and oriented x3 mood and affect normal, dressed appropriately.  HEENT: Pupils equal, extraocular movements intact  Respiratory: Patient's speak in full sentences and does not appear short of breath  Cardiovascular: No lower extremity edema, non tender, no erythema  Low back exam does have some loss lordosis noted.  Some  tenderness to palpation in the paraspinal musculature.  Osteopathic findings  C3 flexed rotated and side bent left T3 extended rotated and side bent left inhaled rib T9 extended rotated and side bent left L2 flexed rotated and side bent left Sacrum right on right       Assessment and Plan:  SI (sacroiliac) joint dysfunction Continue to work on core strength  Discussed continue to work on core strength. Doing well. Medication: Regular basis.  Follow-up again in 6 to 8 weeks    Nonallopathic problems  Decision today to treat with OMT was based on Physical Exam  After verbal consent patient was treated with HVLA, ME, FPR techniques in cervical, rib, thoracic, lumbar, and sacral  areas  Patient tolerated the procedure well with improvement in symptoms  Patient given exercises, stretches and lifestyle modifications  See medications in patient instructions if given  Patient will follow up in 4-8 weeks     The above documentation has been reviewed and is accurate and complete Cheryl Pulley, DO         Note: This dictation was prepared with Dragon dictation along with smaller phrase technology. Any transcriptional errors that result from this process are unintentional.

## 2022-08-27 ENCOUNTER — Ambulatory Visit (INDEPENDENT_AMBULATORY_CARE_PROVIDER_SITE_OTHER): Payer: Commercial Managed Care - PPO | Admitting: Family Medicine

## 2022-08-27 ENCOUNTER — Encounter: Payer: Self-pay | Admitting: Family Medicine

## 2022-08-27 VITALS — BP 116/72 | HR 108 | Ht 66.0 in | Wt 209.0 lb

## 2022-08-27 DIAGNOSIS — M9908 Segmental and somatic dysfunction of rib cage: Secondary | ICD-10-CM

## 2022-08-27 DIAGNOSIS — M533 Sacrococcygeal disorders, not elsewhere classified: Secondary | ICD-10-CM | POA: Diagnosis not present

## 2022-08-27 DIAGNOSIS — M9904 Segmental and somatic dysfunction of sacral region: Secondary | ICD-10-CM

## 2022-08-27 DIAGNOSIS — M9902 Segmental and somatic dysfunction of thoracic region: Secondary | ICD-10-CM | POA: Diagnosis not present

## 2022-08-27 DIAGNOSIS — M9901 Segmental and somatic dysfunction of cervical region: Secondary | ICD-10-CM

## 2022-08-27 DIAGNOSIS — M9903 Segmental and somatic dysfunction of lumbar region: Secondary | ICD-10-CM | POA: Diagnosis not present

## 2022-08-27 NOTE — Patient Instructions (Signed)
Good to see you! Have fun at [facility] See you again in 2-3 months

## 2022-08-27 NOTE — Assessment & Plan Note (Addendum)
Continue to work on core strength  Discussed continue to work on core strength. Doing well. Medication: Regular basis.  Follow-up again in 6 to 8 weeks

## 2022-08-29 ENCOUNTER — Other Ambulatory Visit (HOSPITAL_COMMUNITY): Payer: Self-pay

## 2022-08-30 ENCOUNTER — Other Ambulatory Visit: Payer: Self-pay

## 2022-08-30 ENCOUNTER — Other Ambulatory Visit (HOSPITAL_COMMUNITY): Payer: Self-pay

## 2022-09-24 ENCOUNTER — Other Ambulatory Visit (HOSPITAL_COMMUNITY): Payer: Self-pay

## 2022-09-24 ENCOUNTER — Other Ambulatory Visit: Payer: Self-pay | Admitting: Family Medicine

## 2022-09-24 MED ORDER — FLUOXETINE HCL 40 MG PO CAPS
40.0000 mg | ORAL_CAPSULE | Freq: Every day | ORAL | 1 refills | Status: DC
  Filled 2022-09-24: qty 90, 90d supply, fill #0
  Filled 2023-03-06: qty 90, 90d supply, fill #1

## 2022-09-25 ENCOUNTER — Ambulatory Visit: Payer: Commercial Managed Care - PPO | Admitting: Family Medicine

## 2022-10-02 DIAGNOSIS — F419 Anxiety disorder, unspecified: Secondary | ICD-10-CM | POA: Diagnosis not present

## 2022-10-03 DIAGNOSIS — F419 Anxiety disorder, unspecified: Secondary | ICD-10-CM | POA: Diagnosis not present

## 2022-10-08 DIAGNOSIS — F419 Anxiety disorder, unspecified: Secondary | ICD-10-CM | POA: Diagnosis not present

## 2022-10-08 NOTE — Progress Notes (Unsigned)
  [name] [address] [place] [address] Phone: [phone] Subjective:   [name], am serving as a scribe for Dr. Antoine Primas.  I'm seeing this patient by the request  of:  Sheliah Hatch, MD  CC: Low back and neck pain follow-up  [id number]  Cheryl [name] is a 22 y.o. female coming in with complaint of back and neck pain. OMT on 08/27/2022. Patient states doing well. No new concerns.  Has not been as active since patient took off for her sister's wedding.          Reviewed prior external information including notes and imaging from previsou exam, outside providers and external EMR if available.   As well as notes that were available from care everywhere and other healthcare systems.  Past medical history, social, surgical and family history all reviewed in electronic medical record.  No pertanent information unless stated regarding to the chief complaint.   Past Medical History:  Diagnosis Date   Allergy    Anxiety    Depression    PCOS (polycystic ovarian syndrome) 11/2016    No Known Allergies   Review of Systems:  No headache, visual changes, nausea, vomiting, diarrhea, constipation, dizziness, abdominal pain, skin rash, fevers, chills, night sweats, weight loss, swollen lymph nodes, , joint swelling, chest pain, shortness of breath, mood changes. POSITIVE muscle aches, body aches  Objective  Blood pressure 122/84, pulse 91, height  (1.676 m), weight 204 lb (92.5 kg), SpO2 98 %.   General: No apparent distress alert and oriented x3 mood and affect normal, dressed appropriately.  HEENT: Pupils equal, extraocular movements intact  Respiratory: Patient's speak in full sentences and does not appear short of breath  Cardiovascular: No lower extremity edema, non tender, no erythema  Low back does have some tightness noted around the sacroiliac joint.  Still tightness around the neck also noted.  Negative  straight leg test  Osteopathic findings  C3 flexed rotated and side bent right C4 flexed rotated and side bent left T3 extended rotated and side bent right inhaled rib T5 extended rotated and side bent right with inhaled rib L2 flexed rotated and side bent right Sacrum right on right       Assessment and Plan:  SI (sacroiliac) joint dysfunction Low back exam does have some loss lordosis noted.  Some tenderness to palpation in the paraspinal musculature.  Tightness still noted around the right sacroiliac joint.  Discussed posture and ergonomics also.  Discussed core strengthening.  Follow-up again in 6 to 8 weeks Zanaflex for breakthrough pain  Nonallopathic problems  Decision today to treat with OMT was based on Physical Exam  After verbal consent patient was treated with HVLA, ME, FPR techniques in cervical, rib, thoracic, lumbar, and sacral  areas  Patient tolerated the procedure well with improvement in symptoms  Patient given exercises, stretches and lifestyle modifications  See medications in patient instructions if given  Patient will follow up in 4-8 weeks     The above documentation has been reviewed and is accurate and complete Judi Saa, DO         Note: This dictation was prepared with Dragon dictation along with smaller phrase technology. Any transcriptional errors that result from this process are unintentional.

## 2022-10-09 ENCOUNTER — Ambulatory Visit: Payer: Commercial Managed Care - PPO | Admitting: Family Medicine

## 2022-10-09 ENCOUNTER — Encounter: Payer: Self-pay | Admitting: Family Medicine

## 2022-10-09 VITALS — BP 122/84 | HR 91 | Ht 66.0 in | Wt 204.0 lb

## 2022-10-09 DIAGNOSIS — M533 Sacrococcygeal disorders, not elsewhere classified: Secondary | ICD-10-CM

## 2022-10-09 DIAGNOSIS — M9902 Segmental and somatic dysfunction of thoracic region: Secondary | ICD-10-CM

## 2022-10-09 DIAGNOSIS — M9901 Segmental and somatic dysfunction of cervical region: Secondary | ICD-10-CM | POA: Diagnosis not present

## 2022-10-09 DIAGNOSIS — M9908 Segmental and somatic dysfunction of rib cage: Secondary | ICD-10-CM | POA: Diagnosis not present

## 2022-10-09 DIAGNOSIS — M9903 Segmental and somatic dysfunction of lumbar region: Secondary | ICD-10-CM | POA: Diagnosis not present

## 2022-10-09 DIAGNOSIS — M9904 Segmental and somatic dysfunction of sacral region: Secondary | ICD-10-CM

## 2022-10-09 NOTE — Patient Instructions (Signed)
Good to see you! See you again in 6-8 weeks 

## 2022-10-09 NOTE — Assessment & Plan Note (Signed)
Low back exam does have some loss lordosis noted.  Some tenderness to palpation in the paraspinal musculature.  Tightness still noted around the right sacroiliac joint.  Discussed posture and ergonomics also.  Discussed core strengthening.  Follow-up again in 6 to 8 weeks

## 2022-10-18 DIAGNOSIS — F419 Anxiety disorder, unspecified: Secondary | ICD-10-CM | POA: Diagnosis not present

## 2022-10-24 DIAGNOSIS — F419 Anxiety disorder, unspecified: Secondary | ICD-10-CM | POA: Diagnosis not present

## 2022-11-02 DIAGNOSIS — F419 Anxiety disorder, unspecified: Secondary | ICD-10-CM | POA: Diagnosis not present

## 2022-11-06 ENCOUNTER — Encounter: Payer: Self-pay | Admitting: Family Medicine

## 2022-11-06 ENCOUNTER — Other Ambulatory Visit (HOSPITAL_COMMUNITY): Payer: Self-pay

## 2022-11-06 ENCOUNTER — Ambulatory Visit (INDEPENDENT_AMBULATORY_CARE_PROVIDER_SITE_OTHER): Payer: Commercial Managed Care - PPO | Admitting: Family Medicine

## 2022-11-06 VITALS — BP 116/76 | HR 71 | Temp 98.0°F | Resp 17 | Ht 66.0 in | Wt 209.1 lb

## 2022-11-06 DIAGNOSIS — F900 Attention-deficit hyperactivity disorder, predominantly inattentive type: Secondary | ICD-10-CM

## 2022-11-06 MED ORDER — METHYLPHENIDATE HCL ER (OSM) 27 MG PO TBCR
27.0000 mg | EXTENDED_RELEASE_TABLET | ORAL | 0 refills | Status: DC
  Filled 2022-11-06: qty 30, 30d supply, fill #0

## 2022-11-06 NOTE — Patient Instructions (Signed)
Schedule your complete physical in 3 months RESTART the Concerta 27mg  daily Call with any questions or concerns Stay Safe! Stay Healthy! Have a great summer!!!

## 2022-11-06 NOTE — Assessment & Plan Note (Signed)
Pt would like to restart her Concerta as she is having a hard time completing tasks and she has summer classes to complete.  Since 18mg  isn't lasting long enough, will increase to 27mg  daily.  Pt expressed understanding and is in agreement w/ plan.

## 2022-11-06 NOTE — Progress Notes (Signed)
   Subjective:    Patient ID: Cheryl [name], female    DOB: 04/14/2001, 22 y.o.   MRN: [id number]  HPI ADHD- pt was previously on Concerta.  Stopped taking medication after father passed b/c her 'panic attacks were so bad'.  Pt will randomly take her medication and people will notice her improved focus, ability to complete tasks, and nicer demeanor.  Pt was previously on 18mg  but feels this doesn't last long enough.   Review of Systems For ROS see HPI     Objective:   Physical Exam Vitals reviewed.  Constitutional:      General: She is not in acute distress.    Appearance: Normal appearance. She is not ill-appearing.  HENT:     Head: Normocephalic and atraumatic.  Eyes:     Extraocular Movements: Extraocular movements intact.     Conjunctiva/sclera: Conjunctivae normal.  Cardiovascular:     Rate and Rhythm: Normal rate and regular rhythm.  Pulmonary:     Effort: Pulmonary effort is normal. No respiratory distress.  Skin:    General: Skin is warm and dry.  Neurological:     General: No focal deficit present.     Mental Status: She is alert and oriented to person, place, and time.  Psychiatric:        Mood and Affect: Mood normal.        Behavior: Behavior normal.        Thought Content: Thought content normal.           Assessment & Plan:

## 2022-11-13 DIAGNOSIS — F419 Anxiety disorder, unspecified: Secondary | ICD-10-CM | POA: Diagnosis not present

## 2022-11-13 NOTE — Progress Notes (Unsigned)
  [name] [address] [place] [address] Phone: [phone] Subjective:   Bruce Donath, am serving as a scribe for Dr. Antoine Primas.  I'm seeing this patient by the request  of:  Sheliah Hatch, MD  CC: back and neck pain follow up   [name]  Cheryl [name] is a 22 y.o. female coming in with complaint of back and neck pain. OMT On 10/09/2022. Patient states that she is the same as last visit.  Has been having some tightness recently.  Little bit more anxiety but nothing severe.  Medications patient has been prescribed:   Taking:         Reviewed prior external information including notes and imaging from previsou exam, outside providers and external EMR if available.   As well as notes that were available from care everywhere and other healthcare systems.  Past medical history, social, surgical and family history all reviewed in electronic medical record.  No pertanent information unless stated regarding to the chief complaint.   Past Medical History:  Diagnosis Date   Allergy    Anxiety    Depression    PCOS (polycystic ovarian syndrome) 11/2016    No Known Allergies   Review of Systems:  No headache, visual changes, nausea, vomiting, diarrhea, constipation, dizziness, abdominal pain, skin rash, fevers, chills, night sweats, weight loss, swollen lymph nodes, body aches, joint swelling, chest pain, shortness of breath, mood changes. POSITIVE muscle aches  Objective  Blood pressure 112/72, pulse 96, height 5\' 6"  (1.676 m), weight 209 lb (94.8 kg), SpO2 98 %.   General: No apparent distress alert and oriented x3 mood and affect normal, dressed appropriately.  HEENT: Pupils equal, extraocular movements intact  Respiratory: Patient's speak in full sentences and does not appear short of breath  Cardiovascular: No lower extremity edema, non tender, no erythema  MSK:  Back low back does have some loss of lordosis  noted.  Some tenderness to palpation in the paraspinal musculature.  Osteopathic findings  C2 flexed rotated and side bent right C6 flexed rotated and side bent left T3 extended rotated and side bent right inhaled rib T9 extended rotated and side bent left L2 flexed rotated and side bent right Sacrum right on right     Assessment and Plan:  SI (sacroiliac) joint dysfunction Chronic problem with patient driving a little bit more recently.  We discussed icing regimen and home exercises.  Discussed which activities to do and which ones to avoid.  Continue to work on core strengthening.  Discussed with patient about posture and ergonomics.  Follow-up again in 6 to 8 weeks    Nonallopathic problems  Decision today to treat with OMT was based on Physical Exam  After verbal consent patient was treated with HVLA, ME, FPR techniques in cervical, rib, thoracic, lumbar, and sacral  areas  Patient tolerated the procedure well with improvement in symptoms  Patient given exercises, stretches and lifestyle modifications  See medications in patient instructions if given  Patient will follow up in 4-8 weeks     The above documentation has been reviewed and is accurate and complete Judi Saa, DO         Note: This dictation was prepared with Dragon dictation along with smaller phrase technology. Any transcriptional errors that result from this process are unintentional.

## 2022-11-14 ENCOUNTER — Ambulatory Visit: Payer: Commercial Managed Care - PPO | Admitting: Family Medicine

## 2022-11-14 VITALS — BP 112/72 | HR 96 | Ht 66.0 in | Wt 209.0 lb

## 2022-11-14 DIAGNOSIS — M9908 Segmental and somatic dysfunction of rib cage: Secondary | ICD-10-CM | POA: Diagnosis not present

## 2022-11-14 DIAGNOSIS — M9901 Segmental and somatic dysfunction of cervical region: Secondary | ICD-10-CM | POA: Diagnosis not present

## 2022-11-14 DIAGNOSIS — M9903 Segmental and somatic dysfunction of lumbar region: Secondary | ICD-10-CM

## 2022-11-14 DIAGNOSIS — M533 Sacrococcygeal disorders, not elsewhere classified: Secondary | ICD-10-CM

## 2022-11-14 DIAGNOSIS — M9902 Segmental and somatic dysfunction of thoracic region: Secondary | ICD-10-CM

## 2022-11-14 DIAGNOSIS — M9904 Segmental and somatic dysfunction of sacral region: Secondary | ICD-10-CM

## 2022-11-14 NOTE — Assessment & Plan Note (Signed)
Chronic problem with patient driving a little bit more recently.  We discussed icing regimen and home exercises.  Discussed which activities to do and which ones to avoid.  Continue to work on core strengthening.  Discussed with patient about posture and ergonomics.  Follow-up again in 6 to 8 weeks

## 2022-11-21 DIAGNOSIS — F419 Anxiety disorder, unspecified: Secondary | ICD-10-CM | POA: Diagnosis not present

## 2022-11-27 DIAGNOSIS — F419 Anxiety disorder, unspecified: Secondary | ICD-10-CM | POA: Diagnosis not present

## 2022-11-29 DIAGNOSIS — F419 Anxiety disorder, unspecified: Secondary | ICD-10-CM | POA: Diagnosis not present

## 2022-12-03 ENCOUNTER — Other Ambulatory Visit: Payer: Self-pay | Admitting: Family Medicine

## 2022-12-04 ENCOUNTER — Other Ambulatory Visit (HOSPITAL_COMMUNITY): Payer: Self-pay

## 2022-12-04 ENCOUNTER — Other Ambulatory Visit: Payer: Self-pay

## 2022-12-04 MED ORDER — METHYLPHENIDATE HCL ER (OSM) 27 MG PO TBCR
27.0000 mg | EXTENDED_RELEASE_TABLET | ORAL | 0 refills | Status: DC
  Filled 2022-12-04 – 2022-12-14 (×2): qty 30, 30d supply, fill #0

## 2022-12-04 NOTE — Telephone Encounter (Signed)
Concerta 27  mg LOV: 11/06/22 Last Refill:11/06/22 Upcoming appt: none

## 2022-12-11 ENCOUNTER — Other Ambulatory Visit (HOSPITAL_COMMUNITY): Payer: Self-pay

## 2022-12-13 DIAGNOSIS — F419 Anxiety disorder, unspecified: Secondary | ICD-10-CM | POA: Diagnosis not present

## 2022-12-14 ENCOUNTER — Other Ambulatory Visit (HOSPITAL_COMMUNITY): Payer: Self-pay

## 2022-12-17 DIAGNOSIS — F419 Anxiety disorder, unspecified: Secondary | ICD-10-CM | POA: Diagnosis not present

## 2022-12-17 NOTE — Progress Notes (Deleted)
  [name] [address] [place] [address] Phone: [phone] Subjective:    I'm seeing this patient by the request  of:  Sheliah Hatch, MD  CC: back and neck pain follow up   [id number]  Cheryl JASPERSON is a 22 y.o. female coming in with complaint of back and neck pain. OMT on 11/14/2022. Patient states   Medications patient has been prescribed:   Taking:       Reviewed prior external information including notes and imaging from previsou exam, outside providers and external EMR if available.   As well as notes that were available from care everywhere and other healthcare systems.  Past medical history, social, surgical and family history all reviewed in electronic medical record.  No pertanent information unless stated regarding to the chief complaint.   Past Medical History:  Diagnosis Date   Allergy    Anxiety    Depression    PCOS (polycystic ovarian syndrome) 11/2016    No Known Allergies   Review of Systems:  No headache, visual changes, nausea, vomiting, diarrhea, constipation, dizziness, abdominal pain, skin rash, fevers, chills, night sweats, weight loss, swollen lymph nodes, body aches, joint swelling, chest pain, shortness of breath, mood changes. POSITIVE muscle aches  Objective  There were no vitals taken for this visit.   General: No apparent distress alert and oriented x3 mood and affect normal, dressed appropriately.  HEENT: Pupils equal, extraocular movements intact  Respiratory: Patient's speak in full sentences and does not appear short of breath  Cardiovascular: No lower extremity edema, non tender, no erythema  MSK:  Back   Osteopathic findings  C2 flexed rotated and side bent right C6 flexed rotated and side bent left T3 extended rotated and side bent right inhaled rib T9 extended rotated and side bent left L2 flexed rotated and side bent right Sacrum right on right    Assessment and  Plan:  No problem-specific Assessment & Plan notes found for this encounter.    Nonallopathic problems  Decision today to treat with OMT was based on Physical Exam  After verbal consent patient was treated with HVLA, ME, FPR techniques in cervical, rib, thoracic, lumbar, and sacral  areas  Patient tolerated the procedure well with improvement in symptoms  Patient given exercises, stretches and lifestyle modifications  See medications in patient instructions if given  Patient will follow up in 4-8 weeks    The above documentation has been reviewed and is accurate and complete Judi Saa, DO          Note: This dictation was prepared with Dragon dictation along with smaller phrase technology. Any transcriptional errors that result from this process are unintentional.

## 2022-12-19 ENCOUNTER — Ambulatory Visit: Payer: Commercial Managed Care - PPO | Admitting: Family Medicine

## 2022-12-24 DIAGNOSIS — F419 Anxiety disorder, unspecified: Secondary | ICD-10-CM | POA: Diagnosis not present

## 2022-12-25 NOTE — Progress Notes (Signed)
  [name] [address] [place] [address] Phone: [phone] Subjective:   [name], am serving as a scribe for Dr. Antoine Primas.  I'm seeing this patient by the request  of:  Sheliah Hatch, MD  CC: back  and neck pain   [name]  Cheryl [name] is a 22 y.o. female coming in with complaint of back and neck pain. OMT 11/14/2022. Patient states no new concerns. Same per usual.  Medications patient has been prescribed: None  Taking:         Reviewed prior external information including notes and imaging from previsou exam, outside providers and external EMR if available.   As well as notes that were available from care everywhere and other healthcare systems.  Past medical history, social, surgical and family history all reviewed in electronic medical record.  No pertanent information unless stated regarding to the chief complaint.   Past Medical History:  Diagnosis Date   Allergy    Anxiety    Depression    PCOS (polycystic ovarian syndrome) 11/2016    No Known Allergies   Review of Systems:  No headache, visual changes, nausea, vomiting, diarrhea, constipation, dizziness, abdominal pain, skin rash, fevers, chills, night sweats, weight loss, swollen lymph nodes, body aches, joint swelling, chest pain, shortness of breath, mood changes. POSITIVE muscle aches  Objective  Blood pressure 112/72, pulse 88, height 5\' 6"  (1.676 m), weight 208 lb (94.3 kg), SpO2 97%.   General: No apparent distress alert and oriented x3 mood and affect normal, dressed appropriately.  HEENT: Pupils equal, extraocular movements intact  Respiratory: Patient's speak in full sentences and does not appear short of breath  Cardiovascular: No lower extremity edema, non tender, no erythema  Neck exam on the right side seems to be tighter than previously.  Does have tightness noted with the paraspinal musculature.  Patient has tenderness over  the medial scapular region as well.  Still having tightness around the sacroiliac joint right greater than left with a positive FABER test.  Osteopathic findings  C3 flexed rotated and side bent right C7 flexed rotated and side bent right T3 extended rotated and side bent right inhaled rib T9 extended rotated and side bent right L2 flexed rotated and side bent right Sacrum right on right       Assessment and Plan:  Neck muscle spasm Chronic with some increasing in tightness noted today.  Discussed icing regimen and home exercises.  Discussed paraspinal musculature.  Tightness noted.  Right greater than left.  Increase activity slowly otherwise.  Follow-up again in 6 to 8 weeks.  Has responded well to osteopathic manipulation.  Will continue to monitor.    Nonallopathic problems  Decision today to treat with OMT was based on Physical Exam  After verbal consent patient was treated with HVLA, ME, FPR techniques in cervical, rib, thoracic, lumbar, and sacral  areas  Patient tolerated the procedure well with improvement in symptoms  Patient given exercises, stretches and lifestyle modifications  See medications in patient instructions if given  Patient will follow up in 4-8 weeks      The above documentation has been reviewed and is accurate and complete Judi Saa, DO        Note: This dictation was prepared with Dragon dictation along with smaller phrase technology. Any transcriptional errors that result from this process are unintentional.

## 2022-12-31 ENCOUNTER — Encounter: Payer: Self-pay | Admitting: Family Medicine

## 2022-12-31 ENCOUNTER — Ambulatory Visit: Payer: Commercial Managed Care - PPO | Admitting: Family Medicine

## 2022-12-31 VITALS — BP 112/72 | HR 88 | Ht 66.0 in | Wt 208.0 lb

## 2022-12-31 DIAGNOSIS — M62838 Other muscle spasm: Secondary | ICD-10-CM | POA: Diagnosis not present

## 2022-12-31 DIAGNOSIS — M9904 Segmental and somatic dysfunction of sacral region: Secondary | ICD-10-CM

## 2022-12-31 DIAGNOSIS — M9901 Segmental and somatic dysfunction of cervical region: Secondary | ICD-10-CM

## 2022-12-31 DIAGNOSIS — M9908 Segmental and somatic dysfunction of rib cage: Secondary | ICD-10-CM | POA: Diagnosis not present

## 2022-12-31 DIAGNOSIS — M9903 Segmental and somatic dysfunction of lumbar region: Secondary | ICD-10-CM | POA: Diagnosis not present

## 2022-12-31 DIAGNOSIS — M9902 Segmental and somatic dysfunction of thoracic region: Secondary | ICD-10-CM

## 2022-12-31 DIAGNOSIS — F419 Anxiety disorder, unspecified: Secondary | ICD-10-CM | POA: Diagnosis not present

## 2022-12-31 NOTE — Patient Instructions (Signed)
Good to see you! Stay active Get back in the routine  See you again in 6-8 weeks

## 2022-12-31 NOTE — Assessment & Plan Note (Signed)
Chronic with some increasing in tightness noted today.  Discussed icing regimen and home exercises.  Discussed paraspinal musculature.  Tightness noted.  Right greater than left.  Increase activity slowly otherwise.  Follow-up again in 6 to 8 weeks.  Has responded well to osteopathic manipulation.  Will continue to monitor.

## 2023-01-02 ENCOUNTER — Encounter: Payer: Commercial Managed Care - PPO | Admitting: Family Medicine

## 2023-01-02 ENCOUNTER — Encounter: Payer: Self-pay | Admitting: Family Medicine

## 2023-01-02 VITALS — Ht 66.0 in | Wt 208.0 lb

## 2023-01-02 DIAGNOSIS — F411 Generalized anxiety disorder: Secondary | ICD-10-CM

## 2023-01-02 NOTE — Progress Notes (Signed)
Virtual Visit via Video   I connected with patient on 01/02/23 at 11:20 AM EDT by a video enabled telemedicine application and verified that I am speaking with the correct person using two identifiers.  Location patient: Home Location provider: [facility] Persons participating in the virtual visit: Patient, Provider, CMA Sheryle Hail C)  I discussed the limitations of evaluation and management by telemedicine and the availability of in person appointments. The patient expressed understanding and agreed to proceed.  Subjective:   HPI:   Anxiety- ongoing issue.  Currently on Wellbutrin XL 300mg  daily, Fluoxetine 40mg  daily, and Alprazolam prn  ROS:   See pertinent positives and negatives per HPI.  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   Nonallopathic lesion of sacral region 07/14/2019   Nonallopathic lesion of lumbosacral region 07/14/2019   Nonallopathic lesion of thoracic region 07/14/2019   Medial tibial stress syndrome, right, initial encounter 07/14/2019   Palpitations 04/13/2019   PCOS (polycystic ovarian syndrome) 11/16/2016   Physical exam 11/15/2016   Amenorrhea 11/15/2016   Weight gain 11/15/2016   GAD (generalized anxiety disorder) 03/22/2015    Social History   Tobacco Use   Smoking status: Former    Current packs/day: 0.00    Types: Cigarettes    Quit date: 10/21/2017    Years since quitting: 5.2   Smokeless tobacco: Never  Substance Use Topics   Alcohol use: No    Alcohol/week: 0.0 standard drinks of alcohol    Current Outpatient Medications:    ALPRAZolam (XANAX) 0.5 MG tablet, Take 1 tablet (0.5 mg total) by mouth 2 (two) times daily as needed., Disp: 30 tablet, Rfl: 1    buPROPion (WELLBUTRIN XL) 300 MG 24 hr tablet, Take 1 tablet (300 mg total) by mouth daily., Disp: 90 tablet, Rfl: 1   clindamycin (CLEOCIN T) 1 % lotion, Apply liberally to affected area twice a day, Disp: 60 mL, Rfl: 2   EPINEPHrine 0.3 mg/0.3 mL IJ SOAJ injection, , Disp: , Rfl: 1   FLUoxetine (PROZAC) 40 MG capsule, Take 1 capsule (40 mg total) by mouth daily., Disp: 90 capsule, Rfl: 1   loratadine-pseudoephedrine (CLARITIN-D 24 HOUR) 10-240 MG 24 hr tablet, Take 1 tablet by mouth daily., Disp: 30 tablet, Rfl: 0   methylphenidate (CONCERTA) 27 MG PO CR tablet, Take 1 tablet (27 mg total) by mouth every morning., Disp: 30 tablet, Rfl: 0   ondansetron (ZOFRAN) 4 MG tablet, Take 1 tablet (4 mg total) by mouth every 8 (eight) hours as needed for nausea or vomiting., Disp: 20 tablet, Rfl: 1   traZODone (DESYREL) 50 MG tablet, Take 0.5-1 tablets (25-50 mg total) by mouth at bedtime as needed for sleep, Disp: 30 tablet, Rfl: 3   albuterol (VENTOLIN HFA) 108 (90 Base) MCG/ACT inhaler, Inhale 2 puffs into the lungs every 6 (six) hours as needed for wheezing or shortness of breath., Disp: 6.7 g, Rfl: 0   etonogestrel-ethinyl estradiol (NUVARING) 0.12-0.015 MG/24HR vaginal ring, INSERT 1 RING VAGINALLY ONCE EVERY 4 WEEKS; LEAVE IN FOR 3 WEEKS AND REMOVE FOR 1 WEEK AS DIRECTED BY PROVIDER (Patient not taking: Reported on 01/02/2023), Disp: 3 each, Rfl: 6   fluticasone (FLONASE) 50 MCG/ACT nasal spray, INSTILL 1 TO 2 SPRAYS INTO BOTH NOSTRILS DAILY, Disp: 16  g, Rfl: 3   meloxicam (MOBIC) 15 MG tablet, Take 1 tablet (15 mg total) by mouth daily. (Patient not taking: Reported on 01/02/2023), Disp: 30 tablet, Rfl: 0  No Known Allergies  Objective:   Ht 5\' 6"  (1.676 m)   Wt 208 lb (94.3 kg)   BMI 33.57 kg/m   Assessment and Plan:   This encounter was created in error - please disregard.

## 2023-01-07 DIAGNOSIS — F419 Anxiety disorder, unspecified: Secondary | ICD-10-CM | POA: Diagnosis not present

## 2023-01-14 NOTE — Progress Notes (Signed)
Cheryl [name] is a 22 y.o. female who presents to [facility] at [facility] today for right ankle injury. Inversion injury about 11 days ago. Some soreness the following day. Sx have gotten progressively worse over the past week. Swelling has improved overall. Notes more bruising. Pain with ambulation, flexion, and WB. Hx of similar injury last spring. Has been taking Advil and using ice prn.   Pertinent review of systems: No fevers or chills  Relevant historical information: ADHD   Exam:  BP 112/84   Pulse 91   Ht 5\' 6"  (1.676 m)   Wt 208 lb (94.3 kg)   SpO2 98%   BMI 33.57 kg/m  General: Well Developed, well nourished, and in no acute distress.   MSK: Right ankle swollen lateral ankle.  Otherwise normal-appearing Normal ankle motion pain with eversion and inversion. Strength intact but painful to resisted foot inversion eversion and dorsiflexion. Mildly positive anterior drawer test. Tender to palpation ATFL region. Pulses cap refill and sensation are intact distally.    Lab and Radiology Results   Diagnostic Limited MSK Ultrasound of: Joint effusion is present at the lateral joint near ATFL.  No disruption of the peroneal tendons is present but there is some hypoechoic fluid surrounding the tendon within the tendon sheath. Joint effusion and peroneal tenosynovitis. Impression: Joint effusion  X-ray images right ankle obtained today personally and independently interpreted No severe degenerative changes.  No visible acute fractures present.  On the lateral x-ray there is a line at the posterior aspect of the fibula.  I do not think this is a fracture but it is possible.   Await formal radiology review   Assessment and Plan: 22 y.o. female with right ankle pain thought to be ankle sprain.  Ongoing for now 11 days.  Plan for compression sleeve and physical therapy. Will ask radiology to read the x-ray stat so we get an answer back today. Otherwise  PT compression sleeve recheck 6 weeks.  PDMP not reviewed this encounter. Orders Placed This Encounter  Procedures   DG Ankle Complete Right    Standing Status:   Future    Number of Occurrences:   1    Standing Expiration Date:   01/14/2024    Order Specific Question:   Reason for Exam (SYMPTOM  OR DIAGNOSIS REQUIRED)    Answer:   right ankle injury    Order Specific Question:   Preferred imaging location?    Answer:   [facility]    Order Specific Question:   Is patient pregnant?    Answer:   No   [place] LIMITED JOINT SPACE STRUCTURES LOW RIGHT(NO LINKED CHARGES)    Order Specific Question:   Reason for Exam (SYMPTOM  OR DIAGNOSIS REQUIRED)    Answer:   right ankle pain    Order Specific Question:   Preferred imaging location?    Answer:   [facility] referral to Physical Therapy    Referral Priority:   Routine    Referral Type:   Physical Medicine    Referral Reason:   Specialty Services Required    Requested Specialty:   Physical Therapy    Number of Visits Requested:   1   No orders of the defined types were placed in this encounter.    Discussed warning signs or symptoms. Please see discharge instructions. Patient expresses understanding.   The above documentation has been reviewed and is accurate and complete  Clementeen Graham, M.D.

## 2023-01-15 ENCOUNTER — Other Ambulatory Visit: Payer: Self-pay

## 2023-01-15 ENCOUNTER — Ambulatory Visit (INDEPENDENT_AMBULATORY_CARE_PROVIDER_SITE_OTHER): Payer: Commercial Managed Care - PPO

## 2023-01-15 ENCOUNTER — Encounter: Payer: Self-pay | Admitting: Family Medicine

## 2023-01-15 ENCOUNTER — Ambulatory Visit (INDEPENDENT_AMBULATORY_CARE_PROVIDER_SITE_OTHER): Payer: Commercial Managed Care - PPO | Admitting: Family Medicine

## 2023-01-15 VITALS — BP 112/84 | HR 91 | Ht 66.0 in | Wt 208.0 lb

## 2023-01-15 DIAGNOSIS — S99911A Unspecified injury of right ankle, initial encounter: Secondary | ICD-10-CM | POA: Diagnosis not present

## 2023-01-15 DIAGNOSIS — M25571 Pain in right ankle and joints of right foot: Secondary | ICD-10-CM

## 2023-01-15 NOTE — Patient Instructions (Signed)
Thank you for coming in today.   Please get an Xray today before you leave   I've referred you to Physical Therapy.  Let us know if you don't hear from them in one week.   If not better recheck in about 6 weeks or let me sooner this is not working.   Ok to use that boot in a pinch.   I recommend you obtained a compression sleeve to help with your joint problems. There are many options on the market however I recommend obtaining a full ankle Body Helix compression sleeve.  You can find information (including how to appropriate measure yourself for sizing) can be found at www.[employer].ch.  Many of these products are health savings account (HSA) eligible.   You can use the compression sleeve at any time throughout the day but is most important to use while being active as well as for 2 hours post-activity.   It is appropriate to ice following activity with the compression sleeve in place.   Ankle Sprain, Phase I Rehab An ankle sprain is an injury to the tissues that connect bone to bone (ligaments) in your ankle. Ankle sprains can cause stiffness, loss of motion, and loss of strength. Ask your health care provider which exercises are safe for you. Do exercises exactly as told by your provider and adjust them as directed. It is normal to feel mild stretching, pulling, tightness, or discomfort as you do these exercises. Stop right away if you feel sudden pain or your pain gets worse. Do not begin these exercises until told by your provider. Stretching and range-of-motion exercises These exercises warm up your muscles and joints. They can improve the movement and flexibility of your lower leg and ankle. They also help to relieve pain and stiffness. Gastroc and soleus stretch This exercise is also called a calf stretch. It stretches the muscles in the back of the lower leg. These muscles are the gastrocnemius, or gastroc, and the soleus. Sit on the floor with your left / right leg extended. Loop a  belt or towel around the ball of your left / right foot. The ball of your foot is on the walking surface, right under your toes. Keep your left / right ankle and foot relaxed and keep your knee straight. Use the belt or towel to pull your foot toward you. You should feel a gentle stretch behind your calf or knee in your gastroc muscle. Hold this position for __________ seconds, then release to the starting position. Repeat the exercise with your knee bent. You can put a pillow or a rolled bath towel under your knee to support it. You should feel a stretch deep in your calf in the soleus muscle or at your Achilles tendon. Repeat __________ times. Complete this exercise __________ times a day. Ankle alphabet  Sit with your left / right leg supported at the lower leg. Do not rest your foot on anything. Make sure your foot has room to move freely. Think of your left / right foot as a paintbrush. Move your foot to trace each letter of the alphabet in the air. Keep your hip and knee still while you trace. Make the letters as large as you can without feeling discomfort. Trace every letter from A to Z. Repeat __________ times. Complete this exercise __________ times a day. Strengthening exercises These exercises build strength and endurance in your ankle and lower leg. Endurance is the ability to use your muscles for a long time, even  after they get tired. Ankle dorsiflexion  Secure a rubber exercise band or tube to an object, such as a table leg, that will stay still when the band is pulled. Secure the other end around your left / right foot. Sit on the floor facing the object, with your left / right leg extended. The band or tube should be slightly tense when your foot is relaxed. Slowly bring your foot toward you, bringing the top of your foot toward your shin (dorsiflexion), and pulling the band tighter. Hold this position for __________ seconds. Slowly return your foot to the starting  position. Repeat __________ times. Complete this exercise __________ times a day. Ankle plantar flexion  Sit on the floor with your left / right leg extended. Loop a rubber exercise tube or band around the ball of your left / right foot. The ball of your foot is on the walking surface, right under your toes. Hold the ends of the band or tube in your hands. The band or tube should be slightly tense when your foot is relaxed. Slowly point your foot and toes downward to tilt the top of your foot away from your shin (plantar flexion). Hold this position for __________ seconds. Slowly return your foot to the starting position. Repeat __________ times. Complete this exercise __________ times a day. Ankle eversion  Sit on the floor with your legs straight out in front of you. Loop a rubber exercise band or tube around the ball of your left / right foot. The ball of your foot is on the walking surface, right under your toes. Hold the ends of the band in your hands or secure the band to a stable object. The band or tube should be slightly tense when your foot is relaxed. Slowly push your foot outward, away from your other leg (eversion). Hold this position for __________ seconds. Slowly return your foot to the starting position. Repeat __________ times. Complete this exercise __________ times a day. This information is not intended to replace advice given to you by your health care provider. Make sure you discuss any questions you have with your health care provider. Document Revised: 03/28/2022 Document Reviewed: 03/28/2022 Elsevier Patient Education  2024 [employer].

## 2023-01-15 NOTE — Progress Notes (Signed)
Right ankle x-ray shows some swelling over the outside part of the ankle but no fracture is present.

## 2023-01-24 ENCOUNTER — Ambulatory Visit: Payer: Commercial Managed Care - PPO | Admitting: Family Medicine

## 2023-01-25 ENCOUNTER — Ambulatory Visit: Payer: Commercial Managed Care - PPO | Admitting: Family Medicine

## 2023-01-28 DIAGNOSIS — F419 Anxiety disorder, unspecified: Secondary | ICD-10-CM | POA: Diagnosis not present

## 2023-01-29 ENCOUNTER — Ambulatory Visit: Payer: Commercial Managed Care - PPO | Admitting: Family Medicine

## 2023-02-04 DIAGNOSIS — F419 Anxiety disorder, unspecified: Secondary | ICD-10-CM | POA: Diagnosis not present

## 2023-02-07 NOTE — Progress Notes (Deleted)
  [name] [address] [place] [address] Phone: [phone] Subjective:    I'm seeing this patient by the request  of:  Sheliah Hatch, MD  CC: back and ankle pain   [id number]  Cheryl [name] is a 22 y.o. female coming in with complaint of back and neck pain. OMT 12/31/2022. Saw Dr. Denyse Amass a month ago for acute R ankle pain. Patient states   Medications patient has been prescribed:   Taking:  Xray R ankle 01/15/2023 MPRESSION: Ankle soft tissue edema, most pronounced over the lateral malleolus. No acute fracture or dislocation       Reviewed prior external information including notes and imaging from previsou exam, outside providers and external EMR if available.   As well as notes that were available from care everywhere and other healthcare systems.  Past medical history, social, surgical and family history all reviewed in electronic medical record.  No pertanent information unless stated regarding to the chief complaint.   Past Medical History:  Diagnosis Date   Allergy    Anxiety    Depression    PCOS (polycystic ovarian syndrome) 11/2016    No Known Allergies   Review of Systems:  No headache, visual changes, nausea, vomiting, diarrhea, constipation, dizziness, abdominal pain, skin rash, fevers, chills, night sweats, weight loss, swollen lymph nodes, body aches, joint swelling, chest pain, shortness of breath, mood changes. POSITIVE muscle aches  Objective  Last menstrual period 01/08/2023.   General: No apparent distress alert and oriented x3 mood and affect normal, dressed appropriately.  HEENT: Pupils equal, extraocular movements intact  Respiratory: Patient's speak in full sentences and does not appear short of breath  Cardiovascular: No lower extremity edema, non tender, no erythema  Gait MSK:  Back   Osteopathic findings  C2 flexed rotated and side bent right C6 flexed rotated and side bent  left T3 extended rotated and side bent right inhaled rib T9 extended rotated and side bent left L2 flexed rotated and side bent right Sacrum right on right       Assessment and Plan:  No problem-specific Assessment & Plan notes found for this encounter.    Nonallopathic problems  Decision today to treat with OMT was based on Physical Exam  After verbal consent patient was treated with HVLA, ME, FPR techniques in cervical, rib, thoracic, lumbar, and sacral  areas  Patient tolerated the procedure well with improvement in symptoms  Patient given exercises, stretches and lifestyle modifications  See medications in patient instructions if given  Patient will follow up in 4-8 weeks    The above documentation has been reviewed and is accurate and complete Judi Saa, DO          Note: This dictation was prepared with Dragon dictation along with smaller phrase technology. Any transcriptional errors that result from this process are unintentional.

## 2023-02-11 ENCOUNTER — Ambulatory Visit: Payer: Commercial Managed Care - PPO | Admitting: Family Medicine

## 2023-02-12 ENCOUNTER — Other Ambulatory Visit: Payer: Self-pay

## 2023-02-12 ENCOUNTER — Other Ambulatory Visit: Payer: Self-pay | Admitting: Family Medicine

## 2023-02-12 NOTE — Telephone Encounter (Signed)
Xanax 0.5 mg Requested Prescriptions   Pending Prescriptions Disp Refills   ALPRAZolam (XANAX) 0.5 MG tablet 30 tablet 1    Sig: Take 1 tablet (0.5 mg total) by mouth 2 (two) times daily as needed.   methylphenidate (CONCERTA) 27 MG PO CR tablet 30 tablet 0    Sig: Take 1 tablet (27 mg total) by mouth every morning.     Date of patient request: 02/12/23 Last office visit: 11/06/22 Date of last refill: 07/17/22 Last refill amount: 30 Follow up time period per chart: 3 months  Conerta 27 mg Filled 12/04/22  QTY 30

## 2023-02-13 ENCOUNTER — Other Ambulatory Visit (HOSPITAL_COMMUNITY): Payer: Self-pay

## 2023-02-13 MED ORDER — METHYLPHENIDATE HCL ER (OSM) 27 MG PO TBCR
27.0000 mg | EXTENDED_RELEASE_TABLET | ORAL | 0 refills | Status: DC
  Filled 2023-02-13: qty 30, 30d supply, fill #0

## 2023-02-13 MED ORDER — ALPRAZOLAM 0.5 MG PO TABS
0.5000 mg | ORAL_TABLET | Freq: Two times a day (BID) | ORAL | 1 refills | Status: DC | PRN
  Filled 2023-02-13: qty 30, 15d supply, fill #0
  Filled 2023-04-15: qty 30, 15d supply, fill #1

## 2023-02-14 ENCOUNTER — Other Ambulatory Visit (HOSPITAL_COMMUNITY): Payer: Self-pay

## 2023-02-15 ENCOUNTER — Other Ambulatory Visit (HOSPITAL_COMMUNITY): Payer: Self-pay

## 2023-02-15 MED ORDER — MOUNJARO 2.5 MG/0.5ML ~~LOC~~ SOAJ
2.5000 mg | SUBCUTANEOUS | 0 refills | Status: DC
  Filled 2023-02-15 – 2023-03-11 (×2): qty 2, 28d supply, fill #0

## 2023-02-15 MED ORDER — MOUNJARO 5 MG/0.5ML ~~LOC~~ SOAJ
5.0000 mg | SUBCUTANEOUS | 0 refills | Status: DC
  Filled 2023-04-06: qty 2, 28d supply, fill #0

## 2023-02-19 ENCOUNTER — Other Ambulatory Visit (HOSPITAL_COMMUNITY): Payer: Self-pay

## 2023-02-20 ENCOUNTER — Ambulatory Visit (INDEPENDENT_AMBULATORY_CARE_PROVIDER_SITE_OTHER): Payer: Commercial Managed Care - PPO | Admitting: Family Medicine

## 2023-02-20 ENCOUNTER — Encounter: Payer: Self-pay | Admitting: Family Medicine

## 2023-02-20 VITALS — BP 118/78 | HR 78 | Ht 66.0 in | Wt 211.0 lb

## 2023-02-20 DIAGNOSIS — M9908 Segmental and somatic dysfunction of rib cage: Secondary | ICD-10-CM | POA: Diagnosis not present

## 2023-02-20 DIAGNOSIS — M9902 Segmental and somatic dysfunction of thoracic region: Secondary | ICD-10-CM | POA: Diagnosis not present

## 2023-02-20 DIAGNOSIS — M9903 Segmental and somatic dysfunction of lumbar region: Secondary | ICD-10-CM | POA: Diagnosis not present

## 2023-02-20 DIAGNOSIS — M533 Sacrococcygeal disorders, not elsewhere classified: Secondary | ICD-10-CM | POA: Diagnosis not present

## 2023-02-20 DIAGNOSIS — M9901 Segmental and somatic dysfunction of cervical region: Secondary | ICD-10-CM | POA: Diagnosis not present

## 2023-02-20 DIAGNOSIS — M9904 Segmental and somatic dysfunction of sacral region: Secondary | ICD-10-CM | POA: Diagnosis not present

## 2023-02-20 NOTE — Progress Notes (Signed)
  [name] [address] [place] [address] Phone: [phone] Subjective:   Bruce Donath, am serving as a scribe for Dr. Antoine Primas.  I'm seeing this patient by the request  of:  Sheliah Hatch, MD  CC: back and ankle pain   [id number]  Cheryl [name] is a 22 y.o. female coming in with complaint of back and neck pain. OMT 12/31/2022. Saw Dr. Denyse Amass a month ago for acute R ankle pain. Patient states doing relatively well.  No longer having any significant ankle pain.  Having significant back pain now.  Describes it as a dull, throbbing aching pain.  Medications patient has been prescribed:   Taking:  Xray R ankle 01/15/2023 MPRESSION: Ankle soft tissue edema, most pronounced over the lateral malleolus. No acute fracture or dislocation       Reviewed prior external information including notes and imaging from previsou exam, outside providers and external EMR if available.   As well as notes that were available from care everywhere and other healthcare systems.  Past medical history, social, surgical and family history all reviewed in electronic medical record.  No pertanent information unless stated regarding to the chief complaint.   Past Medical History:  Diagnosis Date   Allergy    Anxiety    Depression    PCOS (polycystic ovarian syndrome) 11/2016    No Known Allergies   Review of Systems:  No headache, visual changes, nausea, vomiting, diarrhea, constipation, dizziness, abdominal pain, skin rash, fevers, chills, night sweats, weight loss, swollen lymph nodes, body aches, joint swelling, chest pain, shortness of breath, mood changes. POSITIVE muscle aches  Objective  Blood pressure 118/78, pulse 78, height 5\' 6"  (1.676 m), weight 211 lb (95.7 kg).   General: No apparent distress alert and oriented x3 mood and affect normal, dressed appropriately.  HEENT: Pupils equal, extraocular movements intact  Respiratory:  Patient's speak in full sentences and does not appear short of breath  Cardiovascular: No lower extremity edema, non tender, no erythema  Gait normal MSK:  Back does have some loss of lordosis noted.  Some tenderness to palpation noted.  Osteopathic findings  C3 flexed rotated and side bent right C5 flexed rotated and side bent left T3 extended rotated and side bent right inhaled rib T9 extended rotated and side bent left L1 flexed rotated and side bent right Sacrum right on right       Assessment and Plan:  SI (sacroiliac) joint dysfunction Discussed which activities to do and which ones to avoid.  Increase activity slowly.  Discussed with patient about hip abductor strengthening.  Follow-up again in 6 to 8 weeks otherwise.    Nonallopathic problems  Decision today to treat with OMT was based on Physical Exam  After verbal consent patient was treated with HVLA, ME, FPR techniques in cervical, rib, thoracic, lumbar, and sacral  areas  Patient tolerated the procedure well with improvement in symptoms  Patient given exercises, stretches and lifestyle modifications  See medications in patient instructions if given  Patient will follow up in 4-8 weeks    The above documentation has been reviewed and is accurate and complete Judi Saa, DO        Note: This dictation was prepared with Dragon dictation along with smaller phrase technology. Any transcriptional errors that result from this process are unintentional.

## 2023-02-20 NOTE — Assessment & Plan Note (Signed)
Discussed which activities to do and which ones to avoid.  Increase activity slowly.  Discussed with patient about hip abductor strengthening.  Follow-up again in 6 to 8 weeks otherwise.

## 2023-02-20 NOTE — Patient Instructions (Signed)
Good to see you!  Watch how you hold that camera See you again in 4-6 weeks

## 2023-02-21 ENCOUNTER — Other Ambulatory Visit (HOSPITAL_COMMUNITY): Payer: Self-pay

## 2023-02-23 ENCOUNTER — Other Ambulatory Visit (HOSPITAL_COMMUNITY): Payer: Self-pay

## 2023-03-11 ENCOUNTER — Other Ambulatory Visit (HOSPITAL_COMMUNITY): Payer: Self-pay

## 2023-03-15 ENCOUNTER — Other Ambulatory Visit: Payer: Self-pay

## 2023-03-15 ENCOUNTER — Ambulatory Visit (INDEPENDENT_AMBULATORY_CARE_PROVIDER_SITE_OTHER): Payer: Commercial Managed Care - PPO | Admitting: Family Medicine

## 2023-03-15 ENCOUNTER — Telehealth: Payer: Self-pay

## 2023-03-15 ENCOUNTER — Other Ambulatory Visit (HOSPITAL_COMMUNITY): Payer: Self-pay

## 2023-03-15 ENCOUNTER — Encounter: Payer: Self-pay | Admitting: Family Medicine

## 2023-03-15 VITALS — BP 118/70 | HR 92 | Ht 66.0 in | Wt 211.0 lb

## 2023-03-15 DIAGNOSIS — M25511 Pain in right shoulder: Secondary | ICD-10-CM | POA: Diagnosis not present

## 2023-03-15 DIAGNOSIS — M533 Sacrococcygeal disorders, not elsewhere classified: Secondary | ICD-10-CM | POA: Diagnosis not present

## 2023-03-15 DIAGNOSIS — M9901 Segmental and somatic dysfunction of cervical region: Secondary | ICD-10-CM

## 2023-03-15 DIAGNOSIS — M9903 Segmental and somatic dysfunction of lumbar region: Secondary | ICD-10-CM | POA: Diagnosis not present

## 2023-03-15 DIAGNOSIS — M9904 Segmental and somatic dysfunction of sacral region: Secondary | ICD-10-CM | POA: Diagnosis not present

## 2023-03-15 DIAGNOSIS — M9902 Segmental and somatic dysfunction of thoracic region: Secondary | ICD-10-CM

## 2023-03-15 DIAGNOSIS — M9908 Segmental and somatic dysfunction of rib cage: Secondary | ICD-10-CM

## 2023-03-15 MED ORDER — TIZANIDINE HCL 4 MG PO TABS
4.0000 mg | ORAL_TABLET | Freq: Every day | ORAL | 0 refills | Status: DC
  Filled 2023-03-15: qty 30, 30d supply, fill #0

## 2023-03-15 NOTE — Telephone Encounter (Signed)
Refilled prescription 

## 2023-03-15 NOTE — Progress Notes (Signed)
Cheryl [name] [address] [place] [address] Phone: [phone] Subjective:   Cheryl [name], am serving as a scribe for Dr. Antoine Primas.  I'm seeing this patient by the request  of:  Sheliah Hatch, MD  CC: Neck and upper back pain  [id number]  Cheryl [name] is a 22 y.o. female coming in with complaint of back and neck pain Patient states couldn't wait until next appointment. Having increased intensity of pain. No new concerns.  Medications patient has been prescribed:   Taking:         Reviewed prior external information including notes and imaging from previsou exam, outside providers and external EMR if available.   As well as notes that were available from care everywhere and other healthcare systems.  Past medical history, social, surgical and family history all reviewed in electronic medical record.  No pertanent information unless stated regarding to the chief complaint.   Past Medical History:  Diagnosis Date   Allergy    Anxiety    Depression    PCOS (polycystic ovarian syndrome) 11/2016    No Known Allergies   Review of Systems:  No headache, visual changes, nausea, vomiting, diarrhea, constipation, dizziness, abdominal pain, skin rash, fevers, chills, night sweats, weight loss, swollen lymph nodes, body aches, joint swelling, chest pain, shortness of breath, mood changes. POSITIVE muscle aches  Objective  Blood pressure 118/70, pulse 92, height 5\' 6"  (1.676 m), weight 211 lb (95.7 kg), SpO2 98%.   General: No apparent distress alert and oriented x3 mood and affect normal, dressed appropriately.  HEENT: Pupils equal, extraocular movements intact  Respiratory: Patient's speak in full sentences and does not appear short of breath  Cardiovascular: No lower extremity edema, non tender, no erythema  Right side does have some tenderness to palpation noted.  Patient does have some discomfort in the scapular area.   Patient does have multiple trigger points in the trapezius, rhomboid and latissimus dorsi on the right side of the neck.  1 on the left side in the latissimus dorsi.  Osteopathic findings  C2 flexed rotated and side bent right C6 flexed rotated and side bent right T3 extended rotated and side bent right inhaled rib T9 extended rotated and side bent left L2 flexed rotated and side bent right Sacrum right on right   After verbal consent patient was prepped with alcohol swab and with a 25-gauge half inch needle injected with a total of 3 cc of 0.5% Marcaine and 1 cc of Kenalog 40 mg/mL minimal blood loss.  Band-Aids placed.  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  Given injection today and tolerated the procedure well, discussed with patient about posture and ergonomics.  Do think patient is under some increasing stress.  Follow-up again in 6 to 8 weeks  Trigger point of right shoulder region Patient given injections and tolerated the procedure well, discussed icing regimen and home exercises.  Discussed which activities to do and which ones to avoid.  Increase activity slowly.  Follow-up again 6 to 8 weeks    Nonallopathic problems  Decision today to treat with OMT was based on Physical Exam  After verbal consent patient was treated with HVLA, ME, FPR techniques in cervical, rib, thoracic, lumbar, and sacral  areas  Patient tolerated the procedure well with improvement in symptoms  Patient given exercises, stretches  and lifestyle modifications  See medications in patient instructions if given  Patient will follow up in 4-8 weeks             Note: This dictation was prepared with Dragon dictation along with smaller phrase technology. Any transcriptional errors that result from this process are unintentional.

## 2023-03-15 NOTE — Telephone Encounter (Signed)
Patient called stating she forgot to ask for a refill of the zanaflex before she left. Can she get that sent to [facility] please

## 2023-03-15 NOTE — Assessment & Plan Note (Signed)
Sacroiliac joint dysfunction with [place] trigger points in the right shoulder area.  Given injection today and tolerated the procedure well, discussed with patient about posture and ergonomics.  Do think patient is under some increasing stress.  Follow-up again in 6 to 8 weeks

## 2023-03-15 NOTE — Assessment & Plan Note (Signed)
Patient given injections and tolerated the procedure well, discussed icing regimen and home exercises.  Discussed which activities to do and which ones to avoid.  Increase activity slowly.  Follow-up again 6 to 8 weeks

## 2023-03-15 NOTE — Patient Instructions (Signed)
Trigger point injections today See you again in 4-6 weeks

## 2023-03-20 ENCOUNTER — Ambulatory Visit: Payer: Commercial Managed Care - PPO | Admitting: Family Medicine

## 2023-03-25 ENCOUNTER — Other Ambulatory Visit: Payer: Self-pay

## 2023-03-25 ENCOUNTER — Other Ambulatory Visit: Payer: Self-pay | Admitting: Family Medicine

## 2023-03-25 ENCOUNTER — Other Ambulatory Visit (HOSPITAL_COMMUNITY): Payer: Self-pay

## 2023-03-25 MED ORDER — TRAZODONE HCL 50 MG PO TABS
25.0000 mg | ORAL_TABLET | Freq: Every evening | ORAL | 3 refills | Status: DC | PRN
  Filled 2023-03-25: qty 30, 30d supply, fill #0
  Filled 2023-05-27: qty 30, 30d supply, fill #1
  Filled 2023-08-26 (×2): qty 30, 30d supply, fill #2
  Filled 2023-10-15: qty 30, 30d supply, fill #3

## 2023-03-25 MED ORDER — METHYLPHENIDATE HCL ER (OSM) 27 MG PO TBCR
27.0000 mg | EXTENDED_RELEASE_TABLET | ORAL | 0 refills | Status: DC
Start: 2023-03-25 — End: 2024-01-15
  Filled 2023-03-25 – 2023-06-01 (×2): qty 30, 30d supply, fill #0

## 2023-03-27 ENCOUNTER — Other Ambulatory Visit (HOSPITAL_COMMUNITY): Payer: Self-pay

## 2023-04-04 ENCOUNTER — Other Ambulatory Visit (HOSPITAL_COMMUNITY): Payer: Self-pay

## 2023-04-06 ENCOUNTER — Other Ambulatory Visit (HOSPITAL_COMMUNITY): Payer: Self-pay

## 2023-04-11 NOTE — Progress Notes (Unsigned)
  [name] [address] [place] [address] Phone: [phone] Subjective:   Cheryl [name], am serving as a scribe for Dr. Antoine Primas.  I'm seeing this patient by the request  of:  Sheliah Hatch, MD  CC: Back and neck pain follow-up  [name]  Daeshawna Amescua Vatalaro is a 22 y.o. female coming in with complaint of back and neck pain. OMT on 03/15/2023. Patient states continues to have morning back and neck pain.  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  Patient seems to be much more motivated at the moment.  Still has not quite helped her neck pain yet.  Medications patient has been prescribed: Zanaflex  Taking:         Reviewed prior external information including notes and imaging from previsou exam, outside providers and external EMR if available.   As well as notes that were available from care everywhere and other healthcare systems.  Past medical history, social, surgical and family history all reviewed in electronic medical record.  No pertanent information unless stated regarding to the chief complaint.   Past Medical History:  Diagnosis Date   Allergy    Anxiety    Depression    PCOS (polycystic ovarian syndrome) 11/2016    No Known Allergies   Review of Systems:  No headache, visual changes, nausea, vomiting, diarrhea, constipation, dizziness, abdominal pain, skin rash, fevers, chills, night sweats, weight loss, swollen lymph nodes, body aches, joint swelling, chest pain, shortness of breath, mood changes. POSITIVE muscle aches  Objective  Blood pressure 112/76, pulse 65, height 5\' 6"  (1.676 m), weight 203 lb (92.1 kg), SpO2 96%.   General: No apparent distress alert and oriented x3 mood and affect normal, dressed appropriately.  HEENT: Pupils equal, extraocular movements intact  Respiratory: Patient's speak in full  sentences and does not appear short of breath  Cardiovascular: No lower extremity edema, non tender, no erythema  Gait MSK:  Back tightness noted in the paraspinal musculature especially on the neck.  Patient seems to have some limited sidebending bilaterally.  Seems to be in the parascapular area as well.  Osteopathic findings  C6 flexed rotated and side bent right T4 extended rotated and side bent right inhaled rib L2 flexed rotated and side bent right Sacrum right on right       Assessment and Plan:  No problem-specific Assessment & Plan notes found for this encounter.    Nonallopathic problems  Decision today to treat with OMT was based on Physical Exam  After verbal consent patient was treated with HVLA, ME, FPR techniques in cervical, rib, thoracic, lumbar, and sacral  areas  Patient tolerated the procedure well with improvement in symptoms  Patient given exercises, stretches and lifestyle modifications  See medications in patient instructions if given  Patient will follow up in 4-8 weeks     The above documentation has been reviewed and is accurate and complete Judi Saa, DO         Note: This dictation was prepared with Dragon dictation along with smaller phrase technology. Any transcriptional errors that result from this process are unintentional.

## 2023-04-17 ENCOUNTER — Other Ambulatory Visit (HOSPITAL_COMMUNITY): Payer: Self-pay

## 2023-04-17 ENCOUNTER — Ambulatory Visit (INDEPENDENT_AMBULATORY_CARE_PROVIDER_SITE_OTHER): Payer: Commercial Managed Care - PPO | Admitting: Family Medicine

## 2023-04-17 VITALS — BP 112/76 | HR 65 | Ht 66.0 in | Wt 203.0 lb

## 2023-04-17 DIAGNOSIS — M9908 Segmental and somatic dysfunction of rib cage: Secondary | ICD-10-CM | POA: Diagnosis not present

## 2023-04-17 DIAGNOSIS — M9904 Segmental and somatic dysfunction of sacral region: Secondary | ICD-10-CM | POA: Diagnosis not present

## 2023-04-17 DIAGNOSIS — M9902 Segmental and somatic dysfunction of thoracic region: Secondary | ICD-10-CM | POA: Diagnosis not present

## 2023-04-17 DIAGNOSIS — M9903 Segmental and somatic dysfunction of lumbar region: Secondary | ICD-10-CM

## 2023-04-17 DIAGNOSIS — M533 Sacrococcygeal disorders, not elsewhere classified: Secondary | ICD-10-CM

## 2023-04-17 DIAGNOSIS — M9901 Segmental and somatic dysfunction of cervical region: Secondary | ICD-10-CM

## 2023-04-17 NOTE — Patient Instructions (Signed)
Good to see you Good luck with double header See me in 6-8 weeks

## 2023-04-18 ENCOUNTER — Encounter: Payer: Self-pay | Admitting: Family Medicine

## 2023-04-18 NOTE — Assessment & Plan Note (Signed)
Some increasing discomfort in the low back.  Will think it is more secondary to still and poor core strength and will continue to work on it.  Hopefully with patient is now having less stress she will start to do much better.  Discussed icing regimen and home exercises.  Follow-up again in 6 to 8 weeks

## 2023-04-25 NOTE — Progress Notes (Signed)
  [name] [address] [place] [address] Phone: [phone] Subjective:   Cheryl [name], am serving as a scribe for Dr. Antoine Primas.  I'm seeing this patient by the request  of:  Sheliah Hatch, MD  CC: Neck and back pain follow-up  [id number]  Cheryl [name] is a 22 y.o. female coming in with complaint of back and neck pain. OMT 04/17/2023. Patient states same per usual. No new concerns.  Medications patient has been prescribed: Zanaflex  Taking:  Lets get cervical x-ray on the way into the room    Last cervical neck x-rays were from 2018 that showed no bony abnormality.   Reviewed prior external information including notes and imaging from previsou exam, outside providers and external EMR if available.   As well as notes that were available from care everywhere and other healthcare systems.  Past medical history, social, surgical and family history all reviewed in electronic medical record.  No pertanent information unless stated regarding to the chief complaint.   Past Medical History:  Diagnosis Date   Allergy    Anxiety    Depression    PCOS (polycystic ovarian syndrome) 11/2016    No Known Allergies   Review of Systems:  No headache, visual changes, nausea, vomiting, diarrhea, constipation, dizziness, abdominal pain, skin rash, fevers, chills, night sweats, weight loss, swollen lymph nodes, body aches, joint swelling, chest pain, shortness of breath, mood changes. POSITIVE muscle aches  Objective  Blood pressure 118/84, pulse 100, height 5\' 6"  (1.676 m), weight 207 lb (93.9 kg), SpO2 96%.   General: No apparent distress alert and oriented x3 mood and affect normal, dressed appropriately.  HEENT: Pupils equal, extraocular movements intact  Respiratory: Patient's speak in full sentences and does not appear short of breath  Cardiovascular: No lower extremity edema, non tender, no erythema  Gait MSK:  Back  low back does have some loss of lordosis.  Tightness noted in the neck left greater than right.  Patient does have some limited sidebending to the right and rotation to the left noted.  Osteopathic findings  C2 flexed rotated and side bent right C7 flexed rotated and side bent left T3 extended rotated and side bent right inhaled rib T7 extended rotated and side bent left L3 flexed rotated and side bent right Sacrum right on right    Assessment and Plan:  SI (sacroiliac) joint dysfunction Sacroiliac joint disc function noted.  Patient does have tightness with [place] Brownie right greater than left.  Will continue to work on it.  Still has more pain now in the neck as well.  Will monitor.  Follow-up again in 6 to 8 weeks otherwise.    Nonallopathic problems  Decision today to treat with OMT was based on Physical Exam  After verbal consent patient was treated with HVLA, ME, FPR techniques in cervical, rib, thoracic, lumbar, and sacral  areas  Patient tolerated the procedure well with improvement in symptoms  Patient given exercises, stretches and lifestyle modifications  See medications in patient instructions if given  Patient will follow up in 4-8 weeks    The above documentation has been reviewed and is accurate and complete Judi Saa, DO          Note: This dictation was prepared with Dragon dictation along with smaller phrase technology. Any transcriptional errors that result from this process are unintentional.

## 2023-04-29 ENCOUNTER — Ambulatory Visit (INDEPENDENT_AMBULATORY_CARE_PROVIDER_SITE_OTHER): Payer: Commercial Managed Care - PPO | Admitting: Family Medicine

## 2023-04-29 ENCOUNTER — Other Ambulatory Visit (HOSPITAL_COMMUNITY): Payer: Self-pay

## 2023-04-29 ENCOUNTER — Encounter: Payer: Self-pay | Admitting: Family Medicine

## 2023-04-29 VITALS — BP 118/84 | HR 100 | Ht 66.0 in | Wt 207.0 lb

## 2023-04-29 DIAGNOSIS — M9904 Segmental and somatic dysfunction of sacral region: Secondary | ICD-10-CM | POA: Diagnosis not present

## 2023-04-29 DIAGNOSIS — M9902 Segmental and somatic dysfunction of thoracic region: Secondary | ICD-10-CM | POA: Diagnosis not present

## 2023-04-29 DIAGNOSIS — M9903 Segmental and somatic dysfunction of lumbar region: Secondary | ICD-10-CM

## 2023-04-29 DIAGNOSIS — M533 Sacrococcygeal disorders, not elsewhere classified: Secondary | ICD-10-CM | POA: Diagnosis not present

## 2023-04-29 DIAGNOSIS — M9908 Segmental and somatic dysfunction of rib cage: Secondary | ICD-10-CM

## 2023-04-29 DIAGNOSIS — M869 Osteomyelitis, unspecified: Secondary | ICD-10-CM | POA: Diagnosis not present

## 2023-04-29 DIAGNOSIS — M9901 Segmental and somatic dysfunction of cervical region: Secondary | ICD-10-CM | POA: Diagnosis not present

## 2023-04-29 MED ORDER — COLCHICINE 0.6 MG PO TABS
0.6000 mg | ORAL_TABLET | Freq: Two times a day (BID) | ORAL | 0 refills | Status: DC
  Filled 2023-04-29: qty 10, 5d supply, fill #0

## 2023-04-29 NOTE — Assessment & Plan Note (Signed)
Sacroiliac joint disc function noted.  Patient does have tightness with Pearlean Brownie right greater than left.  Will continue to work on it.  Still has more pain now in the neck as well.  Will monitor.  Follow-up again in 6 to 8 weeks otherwise.

## 2023-04-29 NOTE — Patient Instructions (Addendum)
Colchicine prescribed If too expensive, write [place] we will send prednisone Eat too much in [place] See you again in 6-8 weeks

## 2023-04-29 NOTE — Assessment & Plan Note (Signed)
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  Likely could be contributing to some of the back pain as well.  Discussed icing regimen and home exercises, which activities to do and which ones to avoid.  Increase activity slowly otherwise.  Follow-up with me again in 6 to 8 weeks if worsening pain can consider prednisone

## 2023-05-10 ENCOUNTER — Other Ambulatory Visit (HOSPITAL_COMMUNITY): Payer: Self-pay

## 2023-05-27 ENCOUNTER — Other Ambulatory Visit (HOSPITAL_COMMUNITY): Payer: Self-pay

## 2023-05-27 ENCOUNTER — Encounter: Payer: Self-pay | Admitting: Family Medicine

## 2023-05-27 ENCOUNTER — Ambulatory Visit: Payer: Commercial Managed Care - PPO | Admitting: Family Medicine

## 2023-05-27 ENCOUNTER — Other Ambulatory Visit (HOSPITAL_COMMUNITY): Payer: Self-pay | Admitting: Anesthesiology

## 2023-05-27 ENCOUNTER — Telehealth: Payer: Commercial Managed Care - PPO | Admitting: Family Medicine

## 2023-05-27 VITALS — BP 110/72 | HR 96 | Temp 98.0°F | Ht 66.0 in

## 2023-05-27 DIAGNOSIS — J329 Chronic sinusitis, unspecified: Secondary | ICD-10-CM

## 2023-05-27 DIAGNOSIS — J029 Acute pharyngitis, unspecified: Secondary | ICD-10-CM | POA: Diagnosis not present

## 2023-05-27 DIAGNOSIS — J069 Acute upper respiratory infection, unspecified: Secondary | ICD-10-CM | POA: Diagnosis not present

## 2023-05-27 DIAGNOSIS — R52 Pain, unspecified: Secondary | ICD-10-CM | POA: Diagnosis not present

## 2023-05-27 DIAGNOSIS — B9689 Other specified bacterial agents as the cause of diseases classified elsewhere: Secondary | ICD-10-CM | POA: Diagnosis not present

## 2023-05-27 LAB — POC INFLUENZA A&B (BINAX/QUICKVUE)
Influenza A, POC: NEGATIVE
Influenza B, POC: NEGATIVE

## 2023-05-27 LAB — POCT RAPID STREP A (OFFICE): Rapid Strep A Screen: NEGATIVE

## 2023-05-27 LAB — POC COVID19 BINAXNOW: SARS Coronavirus 2 Ag: NEGATIVE

## 2023-05-27 MED ORDER — MOUNJARO 2.5 MG/0.5ML ~~LOC~~ SOAJ
2.5000 mg | SUBCUTANEOUS | 0 refills | Status: DC
  Filled 2023-05-27: qty 2, 28d supply, fill #0

## 2023-05-27 MED ORDER — BENZONATATE 100 MG PO CAPS
100.0000 mg | ORAL_CAPSULE | Freq: Three times a day (TID) | ORAL | 0 refills | Status: DC | PRN
Start: 2023-05-27 — End: 2024-01-15
  Filled 2023-05-27: qty 30, 10d supply, fill #0

## 2023-05-27 MED ORDER — AMOXICILLIN 875 MG PO TABS
875.0000 mg | ORAL_TABLET | Freq: Two times a day (BID) | ORAL | 0 refills | Status: AC
  Filled 2023-05-27: qty 20, 10d supply, fill #0

## 2023-05-27 MED ORDER — FLUTICASONE PROPIONATE 50 MCG/ACT NA SUSP
2.0000 | Freq: Every day | NASAL | 0 refills | Status: AC
Start: 2023-05-27 — End: ?
  Filled 2023-05-27: qty 16, 30d supply, fill #0

## 2023-05-27 MED ORDER — FLUCONAZOLE 150 MG PO TABS
150.0000 mg | ORAL_TABLET | Freq: Once | ORAL | 0 refills | Status: AC
  Filled 2023-05-27: qty 1, 1d supply, fill #0

## 2023-05-27 NOTE — Patient Instructions (Signed)
Follow up as needed or as scheduled START the Amoxicillin as directed- take w/ food Drink LOTS of fluids REST when you are able Tylenol or ibuprofen for headache or fever Call with any questions or concerns Hang in there!!! Happy Holidays!!

## 2023-05-27 NOTE — Progress Notes (Signed)
   Subjective:    Patient ID: Cheryl [name], female    DOB: 2000/10/24, 24 y.o.   MRN: [id number]  HPI Sore throat- pt had an E-Visit this morning and was prescribed Flonase and Tessalon.  She states she prefers Prednisone.  Pt reports increased nasal congestion, PND, sore throat.  Minimal cough.  No fever.  + HA.  + frontal sinus pressure.  Sxs started 'a few days ago'.  + sick contacts.  Pt is very stressed out.     Review of Systems For ROS see HPI     Objective:   Physical Exam Vitals reviewed.  Constitutional:      General: She is not in acute distress.    Appearance: She is well-developed. She is not ill-appearing.  HENT:     Head: Normocephalic and atraumatic.     Right Ear: Tympanic membrane normal.     Left Ear: Tympanic membrane normal.     Nose: Mucosal edema and congestion present. No rhinorrhea.     Right Sinus: Frontal sinus tenderness present. No maxillary sinus tenderness.     Left Sinus: Frontal sinus tenderness present. No maxillary sinus tenderness.     Mouth/Throat:     Pharynx: Uvula midline. No oropharyngeal exudate or posterior oropharyngeal erythema.  Eyes:     Conjunctiva/sclera: Conjunctivae normal.     Pupils: Pupils are equal, round, and reactive to light.  Cardiovascular:     Rate and Rhythm: Normal rate and regular rhythm.     Heart sounds: Normal heart sounds.  Pulmonary:     Effort: Pulmonary effort is normal. No respiratory distress.     Breath sounds: Normal breath sounds. No wheezing.  Musculoskeletal:     Cervical back: Normal range of motion and neck supple.  Lymphadenopathy:     Cervical: No cervical adenopathy.  Skin:    General: Skin is warm and dry.  Neurological:     General: No focal deficit present.     Mental Status: She is alert and oriented to person, place, and time.     Cranial Nerves: No cranial nerve deficit.     Motor: No weakness.     Coordination: Coordination normal.  Psychiatric:        Mood and Affect: Mood  normal.        Behavior: Behavior normal.        Thought Content: Thought content normal.           Assessment & Plan:  Bacterial sinusitis- new.  Pt is very TTP over frontal sinuses.  + sick contacts.  Will start Amoxicillin.  Pt reports hx of yeast infxns w/ abx.  Diflucan provided to use prn.

## 2023-05-27 NOTE — Progress Notes (Signed)

## 2023-05-29 ENCOUNTER — Other Ambulatory Visit (HOSPITAL_COMMUNITY): Payer: Self-pay

## 2023-05-29 DIAGNOSIS — Z113 Encounter for screening for infections with a predominantly sexual mode of transmission: Secondary | ICD-10-CM | POA: Diagnosis not present

## 2023-05-29 DIAGNOSIS — N643 Galactorrhea not associated with childbirth: Secondary | ICD-10-CM | POA: Diagnosis not present

## 2023-05-29 DIAGNOSIS — Z124 Encounter for screening for malignant neoplasm of cervix: Secondary | ICD-10-CM | POA: Diagnosis not present

## 2023-05-29 DIAGNOSIS — Z6833 Body mass index (BMI) 33.0-33.9, adult: Secondary | ICD-10-CM | POA: Diagnosis not present

## 2023-05-29 DIAGNOSIS — Z01419 Encounter for gynecological examination (general) (routine) without abnormal findings: Secondary | ICD-10-CM | POA: Diagnosis not present

## 2023-05-29 LAB — HM PAP SMEAR: HM Pap smear: ABNORMAL

## 2023-05-29 NOTE — Progress Notes (Signed)
  Cheryl [name] [address] [place] [address] Phone: [phone] Subjective:   Cheryl [name], am serving as a scribe for Dr. Antoine Primas.  I'm seeing this patient by the request  of:  Sheliah Hatch, MD  CC: Back and neck pain follow-up  [name]  Cheryl [name] is a 22 y.o. female coming in with complaint of back and neck pain. OMT on 04/29/2023. Patient states same per usual.  Patient has been relatively active overall.  Continues to have some discomfort and pain though.  Medications patient has been prescribed: colchicine  Taking:         Reviewed prior external information including notes and imaging from previsou exam, outside providers and external EMR if available.   As well as notes that were available from care everywhere and other healthcare systems.  Past medical history, social, surgical and family history all reviewed in electronic medical record.  No pertanent information unless stated regarding to the chief complaint.   Past Medical History:  Diagnosis Date   Allergy    Anxiety    Depression    PCOS (polycystic ovarian syndrome) 11/2016    No Known Allergies   Review of Systems:  No headache, visual changes, nausea, vomiting, diarrhea, constipation, dizziness, abdominal pain, skin rash, fevers, chills, night sweats, weight loss, swollen lymph nodes, body aches, joint swelling, chest pain, shortness of breath, mood changes. POSITIVE muscle aches  Objective  Blood pressure 116/88, pulse 81, height 5\' 6"  (1.676 m), weight 206 lb (93.4 kg), SpO2 98%.   General: No apparent distress alert and oriented x3 mood and affect normal, dressed appropriately.  HEENT: Pupils equal, extraocular movements intact  Respiratory: Patient's speak in full sentences and does not appear short of breath  Cardiovascular: No lower extremity edema, non tender, no erythema  Gait normal MSK:  Back does have some loss lordosis.   More tightness in the neck though, have some decrease in sidebending bilaterally.  Osteopathic findings  C2 flexed rotated and side bent right C5 flexed rotated and side bent left T3 extended rotated and side bent right inhaled rib T5 extended rotated and side bent left L2 flexed rotated and side bent right Sacrum right on right     Assessment and Plan:  SI (sacroiliac) joint dysfunction Sacroiliac dysfunction noted.  Discussed posture and ergonomics, discussed which activities to do and which ones to avoid.  Increase activity slowly.  Follow-up again in 6 to 8 weeks.    Nonallopathic problems  Decision today to treat with OMT was based on Physical Exam  After verbal consent patient was treated with HVLA, ME, FPR techniques in cervical, rib, thoracic, lumbar, and sacral  areas  Patient tolerated the procedure well with improvement in symptoms  Patient given exercises, stretches and lifestyle modifications  See medications in patient instructions if given  Patient will follow up in 4-8 weeks    The above documentation has been reviewed and is accurate and complete Judi Saa, DO          Note: This dictation was prepared with Dragon dictation along with smaller phrase technology. Any transcriptional errors that result from this process are unintentional.

## 2023-05-30 ENCOUNTER — Ambulatory Visit (INDEPENDENT_AMBULATORY_CARE_PROVIDER_SITE_OTHER): Payer: Commercial Managed Care - PPO | Admitting: Family Medicine

## 2023-05-30 VITALS — BP 116/88 | HR 81 | Ht 66.0 in | Wt 206.0 lb

## 2023-05-30 DIAGNOSIS — M9902 Segmental and somatic dysfunction of thoracic region: Secondary | ICD-10-CM

## 2023-05-30 DIAGNOSIS — M9901 Segmental and somatic dysfunction of cervical region: Secondary | ICD-10-CM | POA: Diagnosis not present

## 2023-05-30 DIAGNOSIS — M9904 Segmental and somatic dysfunction of sacral region: Secondary | ICD-10-CM | POA: Diagnosis not present

## 2023-05-30 DIAGNOSIS — M533 Sacrococcygeal disorders, not elsewhere classified: Secondary | ICD-10-CM | POA: Diagnosis not present

## 2023-05-30 DIAGNOSIS — M9908 Segmental and somatic dysfunction of rib cage: Secondary | ICD-10-CM | POA: Diagnosis not present

## 2023-05-30 DIAGNOSIS — M9903 Segmental and somatic dysfunction of lumbar region: Secondary | ICD-10-CM

## 2023-05-30 NOTE — Patient Instructions (Signed)
Good to see you! Overall not too bad See you again in 2 months

## 2023-05-31 ENCOUNTER — Encounter: Payer: Self-pay | Admitting: Family Medicine

## 2023-05-31 NOTE — Assessment & Plan Note (Signed)
Sacroiliac dysfunction noted.  Discussed posture and ergonomics, discussed which activities to do and which ones to avoid.  Increase activity slowly.  Follow-up again in 6 to 8 weeks.

## 2023-06-01 ENCOUNTER — Other Ambulatory Visit (HOSPITAL_COMMUNITY): Payer: Self-pay

## 2023-06-03 ENCOUNTER — Other Ambulatory Visit: Payer: Self-pay

## 2023-06-11 ENCOUNTER — Other Ambulatory Visit (HOSPITAL_COMMUNITY): Payer: Self-pay

## 2023-07-01 ENCOUNTER — Telehealth: Payer: Self-pay | Admitting: Family Medicine

## 2023-07-01 NOTE — Telephone Encounter (Signed)
 Left vm to call office to see if we can get her appt at another location sooner

## 2023-07-01 NOTE — Telephone Encounter (Addendum)
 Caller name: Triage for [facility]   On DPR?: Yes  Call back number: [phone] (home)  Provider they see: Mahlon Comer BRAVO, MD  Reason for call: Pt has schedule visit 07/03/2023 but appt notes say : Ear trouble, Shortness of breath, cough, tightness in chest - So I sent her to triage.  Pt was advised to F/U with PCP within 24 hours. Left pt a message to call office if she wanted to change her appt from 07/03/23 to another location we don't have any openings here.

## 2023-07-01 NOTE — Telephone Encounter (Signed)
 Called left vm to scheduled with another office sooner

## 2023-07-02 NOTE — Telephone Encounter (Signed)
 Left vm to call office

## 2023-07-03 ENCOUNTER — Other Ambulatory Visit (HOSPITAL_COMMUNITY): Payer: Self-pay

## 2023-07-03 ENCOUNTER — Ambulatory Visit: Payer: Commercial Managed Care - PPO | Admitting: Family Medicine

## 2023-07-03 ENCOUNTER — Encounter: Payer: Self-pay | Admitting: Family Medicine

## 2023-07-03 VITALS — BP 104/72 | HR 87 | Ht 66.0 in | Wt 201.4 lb

## 2023-07-03 DIAGNOSIS — J069 Acute upper respiratory infection, unspecified: Secondary | ICD-10-CM

## 2023-07-03 MED ORDER — PREDNISONE 10 MG PO TABS
ORAL_TABLET | ORAL | 0 refills | Status: AC
  Filled 2023-07-03: qty 18, 9d supply, fill #0

## 2023-07-03 NOTE — Telephone Encounter (Signed)
 Patient Name First: Cheryl Last: [name] Gender: Female DOB: [birth year] Age: 23 Y 4 M 28 D Return Phone Number: [phone] (Primary) Address: City/ State/ Zip: [place] [address] Client [facility] [place] [facility] - Kandi Oris Client Site [facility] Provider Heddy Liverpool- MD Contact Type Call Who Is Calling Patient / Member / Family / Caregiver Call Type Triage / Clinical Relationship To Patient Self Return Phone Number [phone] (Primary) Chief Complaint BREATHING - shortness of breath or sounds breathless Reason for Call Symptomatic / Request for Health Information Initial Comment Caller is experiencing shortness breath, cough, chest pain Translation No Nurse Assessment Nurse: Phineas Breech, RN, Tyra Galley Date/Time (Eastern Time): 07/01/2023 8:48:12 AM Confirm and document reason for call. If symptomatic, describe symptoms. ---Pt has SOB, cough, and earache. Does the patient have any new or worsening symptoms? ---Yes Will a triage be completed? ---Yes Related visit to physician within the last 2 weeks? ---No Does the PT have any chronic conditions? (i.e. diabetes, asthma, this includes High risk factors for pregnancy, etc.) ---No Is the patient pregnant or possibly pregnant? (Ask all females between the ages of 46-55) ---No Is this a behavioral health or substance abuse call? ---No Guidelines Guideline Title Affirmed Question Affirmed Notes Nurse Date/Time [facility] Time) Cough - Acute Productive Earache Carmon, RN, Denise 07/01/2023 8:49:08 AM Disp. Time [facility] Time) Disposition Final User 07/01/2023 8:45:02 AM Send to Urgent Queue Eleni Griffin 07/01/2023 8:51:29 AM See PCP within 24 Hours Yes Carmon, RN, Tyra Galley PLEASE NOTE: All timestamps contained within this report are represented as [place] Standard Time. CONFIDENTIALTY NOTICE: This fax transmission is intended only for the addressee. It contains  information that is legally privileged, confidential or otherwise protected from use or disclosure. If you are not the intended recipient, you are strictly prohibited from reviewing, disclosing, copying using or disseminating any of this information or taking any action in reliance on or regarding this information. If you have received this fax in error, please notify us  immediately by telephone so that we can arrange for its return to us . Phone: [phone], Toll-Free: [phone], Fax: [phone] Page: 2 of 2 Call Id: [id number] Final Disposition 07/01/2023 8:51:29 AM See PCP within 24 Hours Yes Carmon, RN, Flavio Huguenin Disagree/Comply Comply Caller Understands Yes PreDisposition Call Doctor Care Advice Given Per Guideline SEE PCP WITHIN 24 HOURS: PAIN MEDICINES: * ACETAMINOPHEN  - EXTRA STRENGTH TYLENOL : Take 1,000 mg (two 500 mg pills) every 6 to 8 hours as needed. Each Extra Strength Tylenol  pill has 500 mg of acetaminophen . The most you should take is 6 pills a day (3,000 mg total). Note: In [place], the maximum is 8 pills a day (4,000 mg total). * IBUPROFEN  (SUCH AS MOTRIN , ADVIL ): Take 400 mg (two 200 mg pills) by mouth every 6 hours. The most you should take is 6 pills a day (1,200 mg total). COUGHING SPELLS: * Drink warm fluids. Inhale warm mist. This can help relax the airway and also loosen up phlegm. CALL BACK IF: * You become worse CARE ADVICE given per Cough - Acute Productive (Adult) guideline. Referrals REFERRED TO PCP OFFICE  Pt has appt on 07/03/23 at 1:40pm. This was never added to patient's chart.

## 2023-07-03 NOTE — Patient Instructions (Signed)
 Follow up as needed or as scheduled START the Prednisone  as directed- 3 pills at the same time x3 days, then 2 pills at the same time x3 days, then 1 pill daily.  Take w/ food  Drink LOTS of fluids REST! Call with any questions or concerns Stay Safe! Hang in there!!

## 2023-07-03 NOTE — Progress Notes (Signed)
   Subjective:    Patient ID: Cheryl [name], female    DOB: June 13, 2001, 23 y.o.   MRN: [id number]  HPI URI- pt was tx'd w/ Amoxicillin  on 12/9.  Felt better until 1/5 when she developed sore throat, cough, HA, fatigue.  Denies fever.  Pt reports sxs were 'really bad' last week.  Had sinus pressure, ear pain.  'it has settled in my chest'.  Cough is not productive but wet, 'it's stuck'.     Review of Systems For ROS see HPI     Objective:   Physical Exam Vitals reviewed.  Constitutional:      General: She is not in acute distress.    Appearance: Normal appearance. She is not ill-appearing.  HENT:     Head: Normocephalic and atraumatic.     Ears:     Comments: TM's retracted bilaterally, L>R    Nose: Congestion present. No rhinorrhea.     Comments: No TTP over frontal or maxillary sinuses Eyes:     Extraocular Movements: Extraocular movements intact.     Conjunctiva/sclera: Conjunctivae normal.  Cardiovascular:     Rate and Rhythm: Normal rate and regular rhythm.  Pulmonary:     Effort: Pulmonary effort is normal. No respiratory distress.     Breath sounds: No wheezing or rhonchi.     Comments: + dry, hacking cough Musculoskeletal:     Cervical back: Neck supple.  Lymphadenopathy:     Cervical: No cervical adenopathy.  Skin:    General: Skin is warm and dry.  Neurological:     General: No focal deficit present.     Mental Status: She is alert and oriented to person, place, and time.  Psychiatric:        Mood and Affect: Mood normal.        Behavior: Behavior normal.        Thought Content: Thought content normal.           Assessment & Plan:  URI w/ cough- new.  Pt w/o evidence of bacterial infxn on exam.  Will start low dose steroid taper to help regulate ear pressure and clear up cough.  Reviewed supportive care and red flags that should prompt return.  Pt expressed understanding and is in agreement w/ plan.

## 2023-07-04 ENCOUNTER — Other Ambulatory Visit (HOSPITAL_COMMUNITY): Payer: Self-pay

## 2023-07-04 ENCOUNTER — Other Ambulatory Visit: Payer: Self-pay | Admitting: Family Medicine

## 2023-07-04 MED ORDER — ALPRAZOLAM 0.5 MG PO TABS
0.5000 mg | ORAL_TABLET | Freq: Two times a day (BID) | ORAL | 1 refills | Status: DC | PRN
  Filled 2023-07-04 – 2023-07-25 (×2): qty 30, 15d supply, fill #0
  Filled 2023-08-26 (×2): qty 30, 15d supply, fill #1

## 2023-07-04 NOTE — Telephone Encounter (Signed)
Requested Prescriptions   Pending Prescriptions Disp Refills   ALPRAZolam (XANAX) 0.5 MG tablet 30 tablet 1    Sig: Take 1 tablet (0.5 mg total) by mouth 2 (two) times daily as needed.     Date of patient request: 07/04/2023 Last office visit: 07/03/2023 Upcoming visit: Visit date not found Date of last refill: 02/13/2023 Last refill amount: 30

## 2023-07-12 NOTE — Progress Notes (Unsigned)
  [name] [address] [place] [address] Phone: [phone] Subjective:   [name], am serving as a scribe for Dr. Antoine Primas.  I'm seeing this patient by the request  of:  Sheliah Hatch, MD  CC: Back and neck pain  [name]  Chalice Philbert Kossman is a 23 y.o. female coming in with complaint of back and neck pain. OMT on 05/30/2023. Patient states same per usual. No new concerns.  Medications patient has been prescribed:   Taking:         Reviewed prior external information including notes and imaging from previsou exam, outside providers and external EMR if available.   As well as notes that were available from care everywhere and other healthcare systems.  Past medical history, social, surgical and family history all reviewed in electronic medical record.  No pertanent information unless stated regarding to the chief complaint.   Past Medical History:  Diagnosis Date   Allergy    Anxiety    Depression    PCOS (polycystic ovarian syndrome) 11/2016    No Known Allergies   Review of Systems:  No headache, visual changes, nausea, vomiting, diarrhea, constipation, dizziness, abdominal pain, skin rash, fevers, chills, night sweats, weight loss, swollen lymph nodes, body aches, joint swelling, chest pain, shortness of breath, mood changes. POSITIVE muscle aches  Objective  Blood pressure 112/76, pulse (!) 122, height 5\' 6"  (1.676 m), weight 204 lb (92.5 kg), SpO2 98%.   General: No apparent distress alert and oriented x3 mood and affect normal, dressed appropriately.  HEENT: Pupils equal, extraocular movements intact  Respiratory: Patient's speak in full sentences and does not appear short of breath  Cardiovascular: No lower extremity edema, non tender, no erythema  Gait MSK:  Back does have some loss lordosis and some tenderness to palpation noted.  Tightness in the parascapular area right greater than  left.  Osteopathic findings  C3 flexed rotated and side bent right T3 extended rotated and side bent right inhaled rib T9 extended rotated and side bent left L2 flexed rotated and side bent right L4 flexed rotated and side bent right Sacrum right on right       Assessment and Plan:  No problem-specific Assessment & Plan notes found for this encounter.    Nonallopathic problems  Decision today to treat with OMT was based on Physical Exam  After verbal consent patient was treated with HVLA, ME, FPR techniques in cervical, rib, thoracic, lumbar, and sacral  areas  Patient tolerated the procedure well with improvement in symptoms  Patient given exercises, stretches and lifestyle modifications  See medications in patient instructions if given  Patient will follow up in 4-8 weeks     The above documentation has been reviewed and is accurate and complete Judi Saa, DO         Note: This dictation was prepared with Dragon dictation along with smaller phrase technology. Any transcriptional errors that result from this process are unintentional.

## 2023-07-15 ENCOUNTER — Ambulatory Visit (INDEPENDENT_AMBULATORY_CARE_PROVIDER_SITE_OTHER): Payer: Commercial Managed Care - PPO | Admitting: Family Medicine

## 2023-07-15 ENCOUNTER — Other Ambulatory Visit (HOSPITAL_COMMUNITY): Payer: Self-pay

## 2023-07-15 ENCOUNTER — Encounter: Payer: Self-pay | Admitting: Family Medicine

## 2023-07-15 VITALS — BP 112/76 | HR 122 | Ht 66.0 in | Wt 204.0 lb

## 2023-07-15 DIAGNOSIS — M9908 Segmental and somatic dysfunction of rib cage: Secondary | ICD-10-CM

## 2023-07-15 DIAGNOSIS — M9902 Segmental and somatic dysfunction of thoracic region: Secondary | ICD-10-CM | POA: Diagnosis not present

## 2023-07-15 DIAGNOSIS — M9904 Segmental and somatic dysfunction of sacral region: Secondary | ICD-10-CM | POA: Diagnosis not present

## 2023-07-15 DIAGNOSIS — M9903 Segmental and somatic dysfunction of lumbar region: Secondary | ICD-10-CM

## 2023-07-15 DIAGNOSIS — M9901 Segmental and somatic dysfunction of cervical region: Secondary | ICD-10-CM | POA: Diagnosis not present

## 2023-07-15 DIAGNOSIS — M533 Sacrococcygeal disorders, not elsewhere classified: Secondary | ICD-10-CM | POA: Diagnosis not present

## 2023-07-15 NOTE — Assessment & Plan Note (Signed)
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  Has had osteitis pubis previously we will need to monitor as well with patient having some increase in stiffness recently.  Discussed icing regimen, home exercises, core strengthening.  Patient will try to be more active and lose meals.  Follow-up with me again in 6 to 8 weeks otherwise

## 2023-07-15 NOTE — Patient Instructions (Signed)
You're awesome See you again in 6-8 weeks

## 2023-07-25 ENCOUNTER — Other Ambulatory Visit (HOSPITAL_COMMUNITY): Payer: Self-pay

## 2023-07-30 ENCOUNTER — Other Ambulatory Visit (HOSPITAL_COMMUNITY): Payer: Self-pay

## 2023-07-30 DIAGNOSIS — D2261 Melanocytic nevi of right upper limb, including shoulder: Secondary | ICD-10-CM | POA: Diagnosis not present

## 2023-07-30 DIAGNOSIS — D225 Melanocytic nevi of trunk: Secondary | ICD-10-CM | POA: Diagnosis not present

## 2023-07-30 DIAGNOSIS — L738 Other specified follicular disorders: Secondary | ICD-10-CM | POA: Diagnosis not present

## 2023-07-30 MED ORDER — CLINDAMYCIN PHOSPHATE 1 % EX LOTN
TOPICAL_LOTION | CUTANEOUS | 2 refills | Status: DC
  Filled 2023-07-30: qty 60, 30d supply, fill #0

## 2023-08-09 ENCOUNTER — Other Ambulatory Visit (HOSPITAL_COMMUNITY): Payer: Self-pay

## 2023-08-22 NOTE — Progress Notes (Signed)
  [name] [address] [place] [address] Phone: [phone] Subjective:   Cheryl [name], am serving as a scribe for Dr. Antoine Primas. I'm seeing this patient by the request  of:  Sheliah Hatch, MD  CC: Back and neck pain follow-up  [id number]  Cheryl [name] is a 23 y.o. female coming in with complaint of back and neck pain. OMT on 07/15/2023. Patient states that she is doing well.  Has had some mild tightness but nothing severe.  Has been doing relatively well now.  Will  Medications patient has been prescribed:   Taking:         Reviewed prior external information including notes and imaging from previsou exam, outside providers and external EMR if available.   As well as notes that were available from care everywhere and other healthcare systems.  Past medical history, social, surgical and family history all reviewed in electronic medical record.  No pertanent information unless stated regarding to the chief complaint.   Past Medical History:  Diagnosis Date   Allergy    Anxiety    Depression    PCOS (polycystic ovarian syndrome) 11/2016    No Known Allergies   Review of Systems:  No headache, visual changes, nausea, vomiting, diarrhea, constipation, dizziness, abdominal pain, skin rash, fevers, chills, night sweats, weight loss, swollen lymph nodes, body aches, joint swelling, chest pain, shortness of breath, mood changes. POSITIVE muscle aches  Objective  Blood pressure 118/78, pulse (!) 113, height 5\' 6"  (1.676 m), weight 200 lb (90.7 kg), SpO2 97%.   General: No apparent distress alert and oriented x3 mood and affect normal, dressed appropriately.  HEENT: Pupils equal, extraocular movements intact  Respiratory: Patient's speak in full sentences and does not appear short of breath  Cardiovascular: No lower extremity edema, non tender, no erythema  Gait normal MSK:  Back does have some mild loss of  lordosis.  Tightness noted more in the lower back than the mid back today.  Tightness around the sacroiliac joint still noted.  Osteopathic findings C6 flexed rotated and side bent left T3 extended rotated and side bent right inhaled rib T9 extended rotated and side bent left L2 flexed rotated and side bent right L4 flexed rotated and side bent left Sacrum right on right       Assessment and Plan:  SI (sacroiliac) joint dysfunction Continue to have some discomfort of the lower back noted.  Discussed icing regimen and home exercises, which activities to do and which ones to avoid.  Discussed which home exercises would be beneficial.  Follow-up with me again in 6 to 8 weeks.    Nonallopathic problems  Decision today to treat with OMT was based on Physical Exam  After verbal consent patient was treated with HVLA, ME, FPR techniques in cervical, rib, thoracic, lumbar, and sacral  areas  Patient tolerated the procedure well with improvement in symptoms  Patient given exercises, stretches and lifestyle modifications  See medications in patient instructions if given  Patient will follow up in 4-8 weeks     The above documentation has been reviewed and is accurate and complete Judi Saa, DO         Note: This dictation was prepared with Dragon dictation along with smaller phrase technology. Any transcriptional errors that result from this process are unintentional.

## 2023-08-26 ENCOUNTER — Other Ambulatory Visit (HOSPITAL_COMMUNITY): Payer: Self-pay

## 2023-08-26 ENCOUNTER — Other Ambulatory Visit: Payer: Self-pay | Admitting: Family Medicine

## 2023-08-26 ENCOUNTER — Encounter: Payer: Self-pay | Admitting: Family Medicine

## 2023-08-26 ENCOUNTER — Other Ambulatory Visit: Payer: Self-pay

## 2023-08-26 ENCOUNTER — Ambulatory Visit: Payer: Commercial Managed Care - PPO | Admitting: Family Medicine

## 2023-08-26 VITALS — BP 118/78 | HR 113 | Ht 66.0 in | Wt 200.0 lb

## 2023-08-26 DIAGNOSIS — M533 Sacrococcygeal disorders, not elsewhere classified: Secondary | ICD-10-CM

## 2023-08-26 DIAGNOSIS — M9901 Segmental and somatic dysfunction of cervical region: Secondary | ICD-10-CM | POA: Diagnosis not present

## 2023-08-26 DIAGNOSIS — M9904 Segmental and somatic dysfunction of sacral region: Secondary | ICD-10-CM

## 2023-08-26 DIAGNOSIS — M9908 Segmental and somatic dysfunction of rib cage: Secondary | ICD-10-CM

## 2023-08-26 DIAGNOSIS — M9903 Segmental and somatic dysfunction of lumbar region: Secondary | ICD-10-CM

## 2023-08-26 DIAGNOSIS — M9902 Segmental and somatic dysfunction of thoracic region: Secondary | ICD-10-CM

## 2023-08-26 MED ORDER — FLUOXETINE HCL 40 MG PO CAPS
40.0000 mg | ORAL_CAPSULE | Freq: Every day | ORAL | 1 refills | Status: DC
  Filled 2023-08-26: qty 90, 90d supply, fill #0
  Filled 2023-11-29: qty 90, 90d supply, fill #1

## 2023-08-26 NOTE — Assessment & Plan Note (Signed)
 Continue to have some discomfort of the lower back noted.  Discussed icing regimen and home exercises, which activities to do and which ones to avoid.  Discussed which home exercises would be beneficial.  Follow-up with me again in 6 to 8 weeks.

## 2023-08-26 NOTE — Patient Instructions (Signed)
See me in 7-8 weeks 

## 2023-08-28 ENCOUNTER — Encounter: Payer: Self-pay | Admitting: Family Medicine

## 2023-09-17 DIAGNOSIS — H5711 Ocular pain, right eye: Secondary | ICD-10-CM | POA: Diagnosis not present

## 2023-10-09 DIAGNOSIS — F331 Major depressive disorder, recurrent, moderate: Secondary | ICD-10-CM | POA: Diagnosis not present

## 2023-10-09 DIAGNOSIS — F411 Generalized anxiety disorder: Secondary | ICD-10-CM | POA: Diagnosis not present

## 2023-10-09 DIAGNOSIS — F4310 Post-traumatic stress disorder, unspecified: Secondary | ICD-10-CM | POA: Diagnosis not present

## 2023-10-11 NOTE — Progress Notes (Unsigned)
  [facility] [address] [place] [address] Phone: [phone] Subjective:   Cheryl [name], am serving as a scribe for Dr. Ronnell Coins.  I'm seeing this patient by the request  of:  Jess Morita, MD  CC: Neck pain follow-up  [name]  Cheryl [name] is a 23 y.o. female coming in with complaint of back and neck pain. OMT on 08/26/2023. Patient states that she has been doing well. Here for maintenance appt.   Medications patient has been prescribed:   Taking:         Reviewed prior external information including notes and imaging from previsou exam, outside providers and external EMR if available.   As well as notes that were available from care everywhere and other healthcare systems.  Past medical history, social, surgical and family history all reviewed in electronic medical record.  No pertanent information unless stated regarding to the chief complaint.   Past Medical History:  Diagnosis Date   Allergy    Anxiety    Depression    PCOS (polycystic ovarian syndrome) 11/2016    No Known Allergies   Review of Systems:  No headache, visual changes, nausea, vomiting, diarrhea, constipation, dizziness, abdominal pain, skin rash, fevers, chills, night sweats, weight loss, swollen lymph nodes, body aches, joint swelling, chest pain, shortness of breath, mood changes. POSITIVE muscle aches  Objective  Blood pressure 118/82, pulse 73, height 5\' 6"  (1.676 m), weight 199 lb (90.3 kg), SpO2 97%.   General: No apparent distress alert and oriented x3 mood and affect normal, dressed appropriately.  HEENT: Pupils equal, extraocular movements intact  Respiratory: Patient's speak in full sentences and does not appear short of breath  Cardiovascular: No lower extremity edema, non tender, no erythema  Gait MSK:  Back does have some loss lordosis.  Still has some improvement in core strength ago.  Patient otherwise doing very  well with weight loss.  Osteopathic findings  C2 flexed rotated and side bent right C6 flexed rotated and side bent left T3 extended rotated and side bent right inhaled rib T9 extended rotated and side bent left L2 flexed rotated and side bent right L5 flexed rotated and side bent left Sacrum right on right       Assessment and Plan:  SI (sacroiliac) joint dysfunction Continues to have some difficulty but nothing is concerning at the moment.  Patient is going to get in her busy season with her job.  Discussed icing regimen.  Discussed continuing the weight loss and core strengthening.  No change in medications today.  Follow-up again in 6 to 8 weeks otherwise.    Nonallopathic problems  Decision today to treat with OMT was based on Physical Exam  After verbal consent patient was treated with HVLA, ME, FPR techniques in cervical, rib, thoracic, lumbar, and sacral  areas  Patient tolerated the procedure well with improvement in symptoms  Patient given exercises, stretches and lifestyle modifications  See medications in patient instructions if given  Patient will follow up in 4-8 weeks     The above documentation has been reviewed and is accurate and complete Daniela Siebers M Amalia Edgecombe, DO         Note: This dictation was prepared with Dragon dictation along with smaller phrase technology. Any transcriptional errors that result from this process are unintentional.

## 2023-10-15 ENCOUNTER — Ambulatory Visit: Admitting: Family Medicine

## 2023-10-15 ENCOUNTER — Other Ambulatory Visit: Payer: Self-pay | Admitting: Family Medicine

## 2023-10-15 ENCOUNTER — Other Ambulatory Visit (HOSPITAL_COMMUNITY): Payer: Self-pay

## 2023-10-15 VITALS — BP 118/82 | HR 73 | Ht 66.0 in | Wt 199.0 lb

## 2023-10-15 DIAGNOSIS — M9903 Segmental and somatic dysfunction of lumbar region: Secondary | ICD-10-CM

## 2023-10-15 DIAGNOSIS — M533 Sacrococcygeal disorders, not elsewhere classified: Secondary | ICD-10-CM

## 2023-10-15 DIAGNOSIS — M9901 Segmental and somatic dysfunction of cervical region: Secondary | ICD-10-CM

## 2023-10-15 DIAGNOSIS — M9904 Segmental and somatic dysfunction of sacral region: Secondary | ICD-10-CM

## 2023-10-15 DIAGNOSIS — M9902 Segmental and somatic dysfunction of thoracic region: Secondary | ICD-10-CM

## 2023-10-15 DIAGNOSIS — M9908 Segmental and somatic dysfunction of rib cage: Secondary | ICD-10-CM | POA: Diagnosis not present

## 2023-10-15 NOTE — Patient Instructions (Signed)
 See me in 6 weeks

## 2023-10-15 NOTE — Telephone Encounter (Signed)
 Requested Prescriptions   Pending Prescriptions Disp Refills   ALPRAZolam  (XANAX ) 0.5 MG tablet 30 tablet 1    Sig: Take 1 tablet (0.5 mg total) by mouth 2 (two) times daily as needed.     Date of patient request: 10/15/2023 Last office visit: 07/03/2023 Upcoming visit: Visit date not found Date of last refill: 10/15/2023 Last refill amount: 30

## 2023-10-16 ENCOUNTER — Other Ambulatory Visit (HOSPITAL_COMMUNITY): Payer: Self-pay

## 2023-10-16 ENCOUNTER — Encounter: Payer: Self-pay | Admitting: Family Medicine

## 2023-10-16 DIAGNOSIS — H5213 Myopia, bilateral: Secondary | ICD-10-CM | POA: Diagnosis not present

## 2023-10-16 DIAGNOSIS — H52223 Regular astigmatism, bilateral: Secondary | ICD-10-CM | POA: Diagnosis not present

## 2023-10-16 MED ORDER — ALPRAZOLAM 0.5 MG PO TABS
0.5000 mg | ORAL_TABLET | Freq: Two times a day (BID) | ORAL | 1 refills | Status: DC | PRN
  Filled 2023-10-16: qty 30, 15d supply, fill #0
  Filled 2023-11-29: qty 30, 15d supply, fill #1

## 2023-10-16 NOTE — Assessment & Plan Note (Signed)
 Continues to have some difficulty but nothing is concerning at the moment.  Patient is going to get in her busy season with her job.  Discussed icing regimen.  Discussed continuing the weight loss and core strengthening.  No change in medications today.  Follow-up again in 6 to 8 weeks otherwise.

## 2023-10-17 DIAGNOSIS — F331 Major depressive disorder, recurrent, moderate: Secondary | ICD-10-CM | POA: Diagnosis not present

## 2023-10-17 DIAGNOSIS — F4310 Post-traumatic stress disorder, unspecified: Secondary | ICD-10-CM | POA: Diagnosis not present

## 2023-10-17 DIAGNOSIS — F411 Generalized anxiety disorder: Secondary | ICD-10-CM | POA: Diagnosis not present

## 2023-10-24 DIAGNOSIS — F4310 Post-traumatic stress disorder, unspecified: Secondary | ICD-10-CM | POA: Diagnosis not present

## 2023-10-24 DIAGNOSIS — F411 Generalized anxiety disorder: Secondary | ICD-10-CM | POA: Diagnosis not present

## 2023-10-24 DIAGNOSIS — F331 Major depressive disorder, recurrent, moderate: Secondary | ICD-10-CM | POA: Diagnosis not present

## 2023-10-31 DIAGNOSIS — F411 Generalized anxiety disorder: Secondary | ICD-10-CM | POA: Diagnosis not present

## 2023-10-31 DIAGNOSIS — F4310 Post-traumatic stress disorder, unspecified: Secondary | ICD-10-CM | POA: Diagnosis not present

## 2023-10-31 DIAGNOSIS — F331 Major depressive disorder, recurrent, moderate: Secondary | ICD-10-CM | POA: Diagnosis not present

## 2023-11-05 DIAGNOSIS — F331 Major depressive disorder, recurrent, moderate: Secondary | ICD-10-CM | POA: Diagnosis not present

## 2023-11-05 DIAGNOSIS — F4310 Post-traumatic stress disorder, unspecified: Secondary | ICD-10-CM | POA: Diagnosis not present

## 2023-11-05 DIAGNOSIS — F411 Generalized anxiety disorder: Secondary | ICD-10-CM | POA: Diagnosis not present

## 2023-11-07 ENCOUNTER — Ambulatory Visit (INDEPENDENT_AMBULATORY_CARE_PROVIDER_SITE_OTHER): Admitting: Behavioral Health

## 2023-11-14 DIAGNOSIS — F411 Generalized anxiety disorder: Secondary | ICD-10-CM | POA: Diagnosis not present

## 2023-11-14 DIAGNOSIS — F4310 Post-traumatic stress disorder, unspecified: Secondary | ICD-10-CM | POA: Diagnosis not present

## 2023-11-14 DIAGNOSIS — F331 Major depressive disorder, recurrent, moderate: Secondary | ICD-10-CM | POA: Diagnosis not present

## 2023-11-19 DIAGNOSIS — F4310 Post-traumatic stress disorder, unspecified: Secondary | ICD-10-CM | POA: Diagnosis not present

## 2023-11-19 DIAGNOSIS — F411 Generalized anxiety disorder: Secondary | ICD-10-CM | POA: Diagnosis not present

## 2023-11-19 DIAGNOSIS — F331 Major depressive disorder, recurrent, moderate: Secondary | ICD-10-CM | POA: Diagnosis not present

## 2023-11-26 NOTE — Progress Notes (Unsigned)
  [facility] [address] [place] [address] Phone: [phone] Subjective:   Cheryl [name], am serving as a scribe for Dr. Ronnell Coins.\  I'm seeing this patient by the request  of:  Jess Morita, MD  CC: back and neck pain follow up   [name]  [name] is a 23 y.o. female coming in with complaint of back and neck pain. OMT on 10/15/2023. Patient states that she is the same as last visit.  Continues to have some discomfort because patient has been working more.  Seems to be more in the lower back than usual.  Medications patient has been prescribed:   Taking:         Reviewed prior external information including notes and imaging from previsou exam, outside providers and external EMR if available.   As well as notes that were available from care everywhere and other healthcare systems.  Past medical history, social, surgical and family history all reviewed in electronic medical record.  No pertanent information unless stated regarding to the chief complaint.   Past Medical History:  Diagnosis Date   Allergy    Anxiety    Depression    PCOS (polycystic ovarian syndrome) 11/2016    No Known Allergies   Review of Systems:  No headache, visual changes, nausea, vomiting, diarrhea, constipation, dizziness, abdominal pain, skin rash, fevers, chills, night sweats, weight loss, swollen lymph nodes, body aches, joint swelling, chest pain, shortness of breath, mood changes. POSITIVE muscle aches  Objective  Blood pressure 104/74, pulse 77, height 5' 6 (1.676 m), weight 198 lb (89.8 kg), SpO2 96%.   General: No apparent distress alert and oriented x3 mood and affect normal, dressed appropriately.  HEENT: Pupils equal, extraocular movements intact  Respiratory: Patient's speak in full sentences and does not appear short of breath  Cardiovascular: No lower extremity edema, non tender, no erythema  Gait MSK:  Back  does have some loss of lordosis noted.  Some tenderness to palpation of the paraspinal musculature.  Positive Faber right greater than left.  Osteopathic findings  C3 flexed rotated and side bent right C7 flexed rotated and side bent left T5 extended rotated and side bent right inhaled rib T9 extended rotated and side bent left L2 flexed rotated and side bent right Sacrum right on right    Assessment and Plan:  SI (sacroiliac) joint dysfunction Patient did have an exacerbation.  Toradol  and Depo-Medrol  injection given today, patient has been working a lot more.  Discussed core strengthening.  Will be traveling and discussed doing the exercises on a regular basis.  Discussed self-care including massage follow-up again in 6 to 8 weeks    Nonallopathic problems  Decision today to treat with OMT was based on Physical Exam  After verbal consent patient was treated with HVLA, ME, FPR techniques in cervical, rib, thoracic, lumbar, and sacral  areas  Patient tolerated the procedure well with improvement in symptoms  Patient given exercises, stretches and lifestyle modifications  See medications in patient instructions if given  Patient will follow up in 4-8 weeks    The above documentation has been reviewed and is accurate and complete Ashonte Angelucci M Chantrice Hagg, DO          Note: This dictation was prepared with Dragon dictation along with smaller phrase technology. Any transcriptional errors that result from this process are unintentional.

## 2023-11-27 ENCOUNTER — Encounter: Payer: Self-pay | Admitting: Family Medicine

## 2023-11-27 ENCOUNTER — Ambulatory Visit: Admitting: Family Medicine

## 2023-11-27 VITALS — BP 104/74 | HR 77 | Ht 66.0 in | Wt 198.0 lb

## 2023-11-27 DIAGNOSIS — M9901 Segmental and somatic dysfunction of cervical region: Secondary | ICD-10-CM

## 2023-11-27 DIAGNOSIS — M533 Sacrococcygeal disorders, not elsewhere classified: Secondary | ICD-10-CM

## 2023-11-27 DIAGNOSIS — M9904 Segmental and somatic dysfunction of sacral region: Secondary | ICD-10-CM

## 2023-11-27 DIAGNOSIS — M9903 Segmental and somatic dysfunction of lumbar region: Secondary | ICD-10-CM

## 2023-11-27 DIAGNOSIS — F411 Generalized anxiety disorder: Secondary | ICD-10-CM | POA: Diagnosis not present

## 2023-11-27 DIAGNOSIS — M9908 Segmental and somatic dysfunction of rib cage: Secondary | ICD-10-CM

## 2023-11-27 DIAGNOSIS — M9902 Segmental and somatic dysfunction of thoracic region: Secondary | ICD-10-CM | POA: Diagnosis not present

## 2023-11-27 DIAGNOSIS — F331 Major depressive disorder, recurrent, moderate: Secondary | ICD-10-CM | POA: Diagnosis not present

## 2023-11-27 DIAGNOSIS — F4310 Post-traumatic stress disorder, unspecified: Secondary | ICD-10-CM | POA: Diagnosis not present

## 2023-11-27 MED ORDER — METHYLPREDNISOLONE ACETATE 40 MG/ML IJ SUSP
40.0000 mg | Freq: Once | INTRAMUSCULAR | Status: AC
  Administered 2023-11-27: 40 mg via INTRAMUSCULAR

## 2023-11-27 MED ORDER — KETOROLAC TROMETHAMINE 30 MG/ML IJ SOLN
30.0000 mg | Freq: Once | INTRAMUSCULAR | Status: AC
  Administered 2023-11-27: 30 mg via INTRAMUSCULAR

## 2023-11-27 NOTE — Patient Instructions (Addendum)
 Cocktail injection  See you again in July

## 2023-11-27 NOTE — Assessment & Plan Note (Signed)
 Patient did have an exacerbation.  Toradol  and Depo-Medrol  injection given today, patient has been working a lot more.  Discussed core strengthening.  Will be traveling and discussed doing the exercises on a regular basis.  Discussed self-care including massage follow-up again in 6 to 8 weeks

## 2023-11-29 ENCOUNTER — Other Ambulatory Visit (HOSPITAL_COMMUNITY): Payer: Self-pay

## 2023-12-02 ENCOUNTER — Ambulatory Visit: Admitting: Behavioral Health

## 2023-12-02 ENCOUNTER — Encounter: Payer: Self-pay | Admitting: Behavioral Health

## 2023-12-02 ENCOUNTER — Other Ambulatory Visit (HOSPITAL_COMMUNITY): Payer: Self-pay

## 2023-12-02 VITALS — BP 125/77 | HR 87 | Ht 67.0 in | Wt 198.0 lb

## 2023-12-02 DIAGNOSIS — F902 Attention-deficit hyperactivity disorder, combined type: Secondary | ICD-10-CM

## 2023-12-02 DIAGNOSIS — F4310 Post-traumatic stress disorder, unspecified: Secondary | ICD-10-CM

## 2023-12-02 DIAGNOSIS — F411 Generalized anxiety disorder: Secondary | ICD-10-CM | POA: Diagnosis not present

## 2023-12-02 DIAGNOSIS — F99 Mental disorder, not otherwise specified: Secondary | ICD-10-CM

## 2023-12-02 DIAGNOSIS — F5105 Insomnia due to other mental disorder: Secondary | ICD-10-CM | POA: Diagnosis not present

## 2023-12-02 DIAGNOSIS — F331 Major depressive disorder, recurrent, moderate: Secondary | ICD-10-CM | POA: Diagnosis not present

## 2023-12-02 MED ORDER — TRAZODONE HCL 100 MG PO TABS
100.0000 mg | ORAL_TABLET | Freq: Every day | ORAL | 1 refills | Status: DC
  Filled 2023-12-02: qty 30, 30d supply, fill #0
  Filled 2024-02-01: qty 30, 30d supply, fill #1

## 2023-12-02 MED ORDER — DESVENLAFAXINE SUCCINATE ER 50 MG PO TB24
50.0000 mg | ORAL_TABLET | Freq: Every day | ORAL | 1 refills | Status: DC
  Filled 2023-12-02: qty 30, 30d supply, fill #0

## 2023-12-02 MED ORDER — ALPRAZOLAM 0.5 MG PO TABS
0.5000 mg | ORAL_TABLET | ORAL | 1 refills | Status: DC | PRN
  Filled 2023-12-02: qty 30, fill #0
  Filled 2023-12-05: qty 30, 30d supply, fill #0
  Filled 2024-02-01: qty 30, 30d supply, fill #1

## 2023-12-02 NOTE — Progress Notes (Signed)
 [facility] MD/PA/NP Initial Note  12/02/2023 2:43 PM Cheryl [name]  MRN:  [id number]  Chief Complaint:  Chief Complaint   Anxiety; Depression; Medication Refill; Patient Education; Stress; Establish Care     HPI:   Cheryl [name], 23 year old female presents to this office for initial visit and to establish care.  Collateral information should be considered reliable. She is a previous patient of Dr. Bert Britain.  Currently her PCP has been managing her medication.  States that she has struggled with anxiety and depression since her early teen years.  She has been consistent in taking psychotropic medication since that time.  Says that in 2022 her father suddenly passed from an AAA.  Says that she has tried to cope the best she can but her life has been challenging.  Anxiety and depression have worsened.  She is also struggled with having nightmares and psychomotor agitation and upper and lower extremities.  Her panic attacks have lessened but she has had approximately 2 panic attacks since the beginning of the year.  Also started to experience bedwetting after the passing of her father.  Not frequent but did occur 1 time this past month.  She struggled with suicidal ideation during her high school years.  Says that she would not harm self due to the love of her family.  She currently feels safe and verbally contracted for safety with this Clinical research associate.  She lives alone in [place]  and is working as a Chiropractor.  She did not finish college at [employer] and is currently 15 credits shy of finishing her bachelor's degree.  Acknowledges strong family support says that she is very close to her mother.  She is currently dating.  Says that she has irregular periods and not currently on any BC.  Her PHQ-9 was score of 16.  Her MDQ was grossly positive with 12 of the 14 criteria on marked yes.  She numerically rates her depression at 8/10, and anxiety at 8/10.  She endorses periods of hyperactivity,  frequent irritability, periods of increased talkativeness, trouble concentrating.  Endorses previous history of cannabis use.  Not currently using.  Does endorse approximately 4 cocktails per week and sometimes will drink to intoxication.  Not currently sexually active but endorses intercourse first time age 1.  Denies any current mania, no psychosis, no auditory or visual hallucinations.  Denies SI or HI.  Prior psychiatric medication trials: Prozac  Trazodone  Concerta  Wellbutrin  Vyvanse      Visit Diagnosis:    ICD-10-CM   1. Major depressive disorder, recurrent episode, moderate (HCC)  F33.1 desvenlafaxine (PRISTIQ) 50 MG 24 hr tablet    2. Generalized anxiety disorder  F41.1 desvenlafaxine (PRISTIQ) 50 MG 24 hr tablet    3. PTSD (post-traumatic stress disorder)  F43.10 desvenlafaxine (PRISTIQ) 50 MG 24 hr tablet    4. Attention deficit hyperactivity disorder (ADHD), combined type  F90.2 desvenlafaxine (PRISTIQ) 50 MG 24 hr tablet    5. Insomnia due to other mental disorder  F51.05 traZODone  (DESYREL ) 100 MG tablet   F99 ALPRAZolam  (XANAX ) 0.5 MG tablet      Past Psychiatric History: MDD, Anxiety, PTSD  Past Medical History:  Past Medical History:  Diagnosis Date   Allergy    Anxiety    Depression    PCOS (polycystic ovarian syndrome) 11/2016    Past Surgical History:  Procedure Laterality Date   WISDOM TOOTH EXTRACTION     WISDOM TOOTH EXTRACTION      Family Psychiatric History: see chart  Family  History:  Family History  Problem Relation Age of Onset   Depression Mother    Anxiety disorder Mother    Hyperlipidemia Mother    Hypertension Mother    Hypertension Father    Alcohol abuse Sister    Depression Maternal Uncle    Hypertension Maternal Grandfather    Drug abuse Maternal Grandfather    Hypertension Maternal Grandmother     Social History:  Social History   Socioeconomic History   Marital status: Single    Spouse name: n/a   Number of  children: 0   Years of education: 15   Highest education level: High school graduate  Occupational History   Occupation: Dentist:  minor    Comment: [employer] Early Education officer, environmental at [employer]   Occupation: Health and safety inspector: Production assistant, radio FOR SELF EMPLOYED  Tobacco Use   Smoking status: Former    Current packs/day: 0.00    Types: Cigarettes    Quit date: 10/21/2017    Years since quitting: 6.1   Smokeless tobacco: Never  Vaping Use   Vaping status: Some Days  Substance and Sexual Activity   Alcohol use: No    Comment: 4 drinks/coctails per week   Drug use: No   Sexual activity: Not Currently  Other Topics Concern   Not on file  Social History Narrative   Living alone house alone in [place].  Experiencing anhedonia, no hobbies right now.    Social Drivers of Corporate investment banker Strain: Not on file  Food Insecurity: No Food Insecurity (11/19/2017)   Hunger Vital Sign    Worried About Running Out of Food in the Last Year: Never true    Ran Out of Food in the Last Year: Never true  Transportation Needs: No Transportation Needs (11/19/2017)   PRAPARE - Administrator, Civil Service (Medical): No    Lack of Transportation (Non-Medical): No  Physical Activity: Insufficiently Active (11/19/2017)   Exercise Vital Sign    Days of Exercise per Week: 1 day    Minutes of Exercise per Session: 30 min  Stress: Stress Concern Present (11/19/2017)   [employer] of Occupational Health - Occupational Stress Questionnaire    Feeling of Stress : Rather much  Social Connections: Moderately Integrated (11/19/2017)   Social Connection and Isolation Panel    Frequency of Communication with Friends and Family: More than three times a week    Frequency of Social Gatherings with Friends and Family: More than three times a week    Attends Religious Services: More than 4 times per year    Active Member of Clubs or Organizations: Yes    Attends Museum/gallery exhibitions officer: More than 4 times per year    Marital Status: Never married    Allergies: No Known Allergies  Metabolic Disorder Labs: Lab Results  Component Value Date   HGBA1C 5.3 11/11/2019   Lab Results  Component Value Date   PROLACTIN 7.6 11/15/2016   Lab Results  Component Value Date   CHOL 172 12/08/2020   TRIG 84.0 12/08/2020   HDL 46.60 12/08/2020   CHOLHDL 4 12/08/2020   VLDL 16.8 12/08/2020   LDLCALC 109 (H) 12/08/2020   LDLCALC 83 11/11/2019   Lab Results  Component Value Date   TSH 2.96 07/19/2022   TSH 2.13 12/08/2020    Therapeutic Level Labs: No results found for: LITHIUM No results found for: VALPROATE No results found for: CBMZ  Current Medications: Current Outpatient Medications  Medication Sig Dispense Refill   desvenlafaxine (PRISTIQ) 50 MG 24 hr tablet Take 1 tablet (50 mg total) by mouth daily. 30 tablet 1   traZODone  (DESYREL ) 100 MG tablet Take 1 tablet (100 mg total) by mouth at bedtime. 30 tablet 1   albuterol  (VENTOLIN  HFA) 108 (90 Base) MCG/ACT inhaler Inhale 2 puffs into the lungs every 6 (six) hours as needed for wheezing or shortness of breath. 6.7 g 0   ALPRAZolam  (XANAX ) 0.5 MG tablet Take 1 tablet (0.5 mg total) by mouth as needed. 30 tablet 1   benzonatate  (TESSALON ) 100 MG capsule Take 1 capsule (100 mg total) by mouth 3 (three) times daily as needed for cough. 30 capsule 0   buPROPion  (WELLBUTRIN  XL) 300 MG 24 hr tablet Take 1 tablet (300 mg total) by mouth daily. 90 tablet 1   clindamycin  (CLEOCIN  T) 1 % lotion Apply liberally to affected area twice a day 60 mL 2   clindamycin  (CLEOCIN  T) 1 % lotion Apply liberally to affected area twice a day 60 mL 2   colchicine  0.6 MG tablet Take 1 tablet (0.6 mg total) by mouth 2 (two) times daily. 10 tablet 0   EPINEPHrine 0.3 mg/0.3 mL IJ SOAJ injection   1   FLUoxetine  (PROZAC ) 40 MG capsule Take 1 capsule (40 mg total) by mouth daily. 90 capsule 1   fluticasone  (FLONASE ) 50  MCG/ACT nasal spray Place 2 sprays into both nostrils daily. 16 g 0   loratadine -pseudoephedrine (CLARITIN -D 24 HOUR) 10-240 MG 24 hr tablet Take 1 tablet by mouth daily. 30 tablet 0   methylphenidate  (CONCERTA ) 27 MG PO CR tablet Take 1 tablet (27 mg total) by mouth every morning. 30 tablet 0   ondansetron  (ZOFRAN ) 4 MG tablet Take 1 tablet (4 mg total) by mouth every 8 (eight) hours as needed for nausea or vomiting. 20 tablet 1   tirzepatide  (MOUNJARO ) 2.5 MG/0.5ML Pen Inject 2.5 mg into the skin once a week. 2 mL 0   tirzepatide  (MOUNJARO ) 5 MG/0.5ML Pen Inject 5 mg into the skin once a week. 2 mL 0   tiZANidine  (ZANAFLEX ) 4 MG tablet Take 1 tablet (4 mg total) by mouth at bedtime. 30 tablet 0   traZODone  (DESYREL ) 50 MG tablet Take 0.5-1 tablets (25-50 mg total) by mouth at bedtime as needed for sleep 30 tablet 3   No current facility-administered medications for this visit.    Medication Side Effects: none  Orders placed this visit:  No orders of the defined types were placed in this encounter.   Psychiatric Specialty Exam:  Review of Systems  Neurological:  Positive for headaches.    There were no vitals taken for this visit.There is no height or weight on file to calculate BMI.  General Appearance: Casual, Neat, and Well Groomed  Eye Contact:  Good  Speech:  Clear and Coherent  Volume:  Normal  Mood:  Anxious, Depressed, and Dysphoric  Affect:  Depressed and Anxious  Thought Process:  Coherent  Orientation:  Full (Time, Place, and Person)  Thought Content: Logical   Suicidal Thoughts:  No  Homicidal Thoughts:  No  Memory:  WNL  Judgement:  Good  Insight:  Good  Psychomotor Activity:  Normal  Concentration:  Concentration: Good  Recall:  Good  Fund of Knowledge: Good  Language: Good  Assets:  Desire for Improvement  ADL's:  Intact  Cognition: WNL  Prognosis:  Good   Screenings:  GAD-7  Flowsheet Row Office Visit from [facility]2023 [facility] [place]  [facility] Visit from 05/27/2023 in [facility] Erroneous Encounter from 01/02/2023 in [facility] Visit from 11/06/2022 in [facility]  Total GAD-7 Score 6 13 0 0   PHQ2-9    Flowsheet Row Office Visit from 12/02/2023 in [facility] Office Visit from 07/03/2023 in [facility] Visit from 05/27/2023 in [facility] Erroneous Encounter from 01/02/2023 in [facility] [employer] Visit from 11/06/2022 in [facility] Total Score 5 1 1 2  0  PHQ-9 Total Score 16 3 6 6  0    Receiving Psychotherapy: No   Treatment Plan/Recommendations:   Greater than 50% of  60 min face to face time with patient was spent on counseling and coordination of care. We discussed her long-term problems with anxiety and depression, and ADHD stemming back to early teen years.  Discussed her POC and various providers over the years.  Reviewed her current medications and past psychotropic medication trials.  We agreed today to: Will start Pristiq 50 mg daily in the a.m. after breakfast Will decrease Prozac  to 40 mg every other day for 1 week.  Then every 2 days for 1 week and then stop. To increase trazodone  to 100 mg daily at bedtime To continue Xanax  0.5 mg daily as needed for severe anxiety or panic Will report worsening symptoms or side effects promptly Will follow-up in 4 weeks to reassess [facility]-last week, No BC. Counseled on risk of pregnancy while using certain psychiatric medication.  Reviewed PDMP Discussed potential benefits, risk, and side effects of benzodiazepines to include potential risk of tolerance and dependence, as well as possible drowsiness.  Advised  patient not to drive if experiencing drowsiness and to take lowest possible effective dose to minimize risk of dependence and tolerance.      Lincoln Renshaw, NP

## 2023-12-04 ENCOUNTER — Other Ambulatory Visit (HOSPITAL_COMMUNITY): Payer: Self-pay

## 2023-12-05 ENCOUNTER — Other Ambulatory Visit (HOSPITAL_COMMUNITY): Payer: Self-pay

## 2023-12-18 DIAGNOSIS — F411 Generalized anxiety disorder: Secondary | ICD-10-CM | POA: Diagnosis not present

## 2023-12-18 DIAGNOSIS — Z713 Dietary counseling and surveillance: Secondary | ICD-10-CM | POA: Diagnosis not present

## 2023-12-18 DIAGNOSIS — Z6832 Body mass index (BMI) 32.0-32.9, adult: Secondary | ICD-10-CM | POA: Diagnosis not present

## 2023-12-26 DIAGNOSIS — F411 Generalized anxiety disorder: Secondary | ICD-10-CM | POA: Diagnosis not present

## 2024-01-01 ENCOUNTER — Ambulatory Visit (INDEPENDENT_AMBULATORY_CARE_PROVIDER_SITE_OTHER): Admitting: Behavioral Health

## 2024-01-01 ENCOUNTER — Encounter: Payer: Self-pay | Admitting: Behavioral Health

## 2024-01-01 ENCOUNTER — Other Ambulatory Visit (HOSPITAL_BASED_OUTPATIENT_CLINIC_OR_DEPARTMENT_OTHER): Payer: Self-pay

## 2024-01-01 ENCOUNTER — Other Ambulatory Visit (HOSPITAL_COMMUNITY): Payer: Self-pay

## 2024-01-01 DIAGNOSIS — F5105 Insomnia due to other mental disorder: Secondary | ICD-10-CM | POA: Diagnosis not present

## 2024-01-01 DIAGNOSIS — F902 Attention-deficit hyperactivity disorder, combined type: Secondary | ICD-10-CM | POA: Diagnosis not present

## 2024-01-01 DIAGNOSIS — F4310 Post-traumatic stress disorder, unspecified: Secondary | ICD-10-CM | POA: Diagnosis not present

## 2024-01-01 DIAGNOSIS — F99 Mental disorder, not otherwise specified: Secondary | ICD-10-CM

## 2024-01-01 DIAGNOSIS — F411 Generalized anxiety disorder: Secondary | ICD-10-CM | POA: Diagnosis not present

## 2024-01-01 DIAGNOSIS — F331 Major depressive disorder, recurrent, moderate: Secondary | ICD-10-CM

## 2024-01-01 MED ORDER — DESVENLAFAXINE SUCCINATE ER 50 MG PO TB24
50.0000 mg | ORAL_TABLET | Freq: Every day | ORAL | 1 refills | Status: DC
  Filled 2024-01-01: qty 30, 30d supply, fill #0
  Filled 2024-02-01: qty 30, 30d supply, fill #1

## 2024-01-01 MED ORDER — TRAZODONE HCL 50 MG PO TABS
25.0000 mg | ORAL_TABLET | Freq: Every evening | ORAL | 3 refills | Status: DC | PRN
  Filled 2024-01-01: qty 30, 30d supply, fill #0

## 2024-01-01 MED ORDER — CLONIDINE HCL 0.1 MG PO TABS
0.1000 mg | ORAL_TABLET | Freq: Every day | ORAL | 11 refills | Status: AC
  Filled 2024-01-01: qty 30, 30d supply, fill #0
  Filled 2024-02-01: qty 30, 30d supply, fill #1
  Filled 2024-02-26: qty 30, 30d supply, fill #2
  Filled 2024-03-31: qty 30, 30d supply, fill #3
  Filled 2024-05-04: qty 30, 30d supply, fill #4
  Filled 2024-06-04: qty 30, 30d supply, fill #5
  Filled 2024-07-08: qty 30, 30d supply, fill #6

## 2024-01-01 NOTE — Progress Notes (Signed)
 [facility]  Patient ID: Cheryl [name],  MRN: [id number]  PCP: Mahlon Comer BRAVO, MD  Date of Evaluation: 01/01/2024 Time spent:30 minutes  Chief Complaint:  Chief Complaint   Depression; Anxiety; ADHD; Follow-up; Patient Education; Medication Refill     HISTORY/CURRENT STATUS: HPI  [place], 23 year old female presents to this office follow up and medication management. Collateral information should be considered reliable. Report moderate improvement since switching to Pristiq  but not totally where she would like to be. She would like to allow more time for the medication to work. She says Trazodone  was working Adult nurse for improved sleep quality but started to develop very lucid dreams that leave her feeling non rested.  Has questions about pregnancy and psychiatric medication.  She numerically rates her depression at 4/10, and anxiety at 4/10.  Not currently sexually active but endorses intercourse first time age 50.  Denies any current mania, no psychosis, no auditory or visual hallucinations.  Denies SI or HI.   Prior psychiatric medication trials: Prozac  Trazodone  Concerta  Wellbutrin  Vyvanse    Individual Medical History/ Review of Systems: Changes? :No   Allergies: Patient has no known allergies.  Current Medications:  Current Outpatient Medications:    cloNIDine  (CATAPRES ) 0.1 MG tablet, Take 1 tablet (0.1 mg total) by mouth at bedtime., Disp: 30 tablet, Rfl: 11   albuterol  (VENTOLIN  HFA) 108 (90 Base) MCG/ACT inhaler, Inhale 2 puffs into the lungs every 6 (six) hours as needed for wheezing or shortness of breath., Disp: 6.7 g, Rfl: 0   ALPRAZolam  (XANAX ) 0.5 MG tablet, Take 1 tablet (0.5 mg total) by mouth as needed., Disp: 30 tablet, Rfl: 1   benzonatate  (TESSALON ) 100 MG capsule, Take 1 capsule (100 mg total) by mouth 3 (three) times daily as needed for cough., Disp: 30 capsule, Rfl: 0   buPROPion  (WELLBUTRIN  XL) 300 MG 24 hr tablet, Take 1 tablet  (300 mg total) by mouth daily., Disp: 90 tablet, Rfl: 1   clindamycin  (CLEOCIN  T) 1 % lotion, Apply liberally to affected area twice a day, Disp: 60 mL, Rfl: 2   clindamycin  (CLEOCIN  T) 1 % lotion, Apply liberally to affected area twice a day, Disp: 60 mL, Rfl: 2   colchicine  0.6 MG tablet, Take 1 tablet (0.6 mg total) by mouth 2 (two) times daily., Disp: 10 tablet, Rfl: 0   desvenlafaxine  (PRISTIQ ) 50 MG 24 hr tablet, Take 1 tablet (50 mg total) by mouth daily., Disp: 30 tablet, Rfl: 1   EPINEPHrine 0.3 mg/0.3 mL IJ SOAJ injection, , Disp: , Rfl: 1   FLUoxetine  (PROZAC ) 40 MG capsule, Take 1 capsule (40 mg total) by mouth daily., Disp: 90 capsule, Rfl: 1   fluticasone  (FLONASE ) 50 MCG/ACT nasal spray, Place 2 sprays into both nostrils daily., Disp: 16 g, Rfl: 0   loratadine -pseudoephedrine (CLARITIN -D 24 HOUR) 10-240 MG 24 hr tablet, Take 1 tablet by mouth daily., Disp: 30 tablet, Rfl: 0   methylphenidate  (CONCERTA ) 27 MG PO CR tablet, Take 1 tablet (27 mg total) by mouth every morning., Disp: 30 tablet, Rfl: 0   ondansetron  (ZOFRAN ) 4 MG tablet, Take 1 tablet (4 mg total) by mouth every 8 (eight) hours as needed for nausea or vomiting., Disp: 20 tablet, Rfl: 1   tirzepatide  (MOUNJARO ) 2.5 MG/0.5ML Pen, Inject 2.5 mg into the skin once a week., Disp: 2 mL, Rfl: 0   tirzepatide  (MOUNJARO ) 5 MG/0.5ML Pen, Inject 5 mg into the skin once a week., Disp: 2 mL, Rfl: 0   tiZANidine  (ZANAFLEX )  4 MG tablet, Take 1 tablet (4 mg total) by mouth at bedtime., Disp: 30 tablet, Rfl: 0   traZODone  (DESYREL ) 100 MG tablet, Take 1 tablet (100 mg total) by mouth at bedtime., Disp: 30 tablet, Rfl: 1   traZODone  (DESYREL ) 50 MG tablet, Take 0.5-1 tablets (25-50 mg total) by mouth at bedtime as needed for sleep, Disp: 30 tablet, Rfl: 3 Medication Side Effects: none  Family Medical/ Social History: Changes? No  MENTAL HEALTH EXAM:  There were no vitals taken for this visit.There is no height or weight on file to  calculate BMI.  General Appearance: Casual, Neat, and Well Groomed  Eye Contact:  Good  Speech:  Clear and Coherent  Volume:  Normal  Mood:  NA  Affect:  Appropriate  Thought Process:  Coherent  Orientation:  Full (Time, Place, and Person)  Thought Content: Logical   Suicidal Thoughts:  No  Homicidal Thoughts:  No  Memory:  WNL  Judgement:  Good  Insight:  Good  Psychomotor Activity:  Normal  Concentration:  Concentration: Good  Recall:  Good  Fund of Knowledge: Good  Language: Good  Assets:  Desire for Improvement  ADL's:  Intact  Cognition: WNL  Prognosis:  Good    DIAGNOSES:    ICD-10-CM   1. Major depressive disorder, recurrent episode, moderate (HCC)  F33.1 cloNIDine  (CATAPRES ) 0.1 MG tablet    desvenlafaxine  (PRISTIQ ) 50 MG 24 hr tablet    2. Generalized anxiety disorder  F41.1 cloNIDine  (CATAPRES ) 0.1 MG tablet    desvenlafaxine  (PRISTIQ ) 50 MG 24 hr tablet    3. PTSD (post-traumatic stress disorder)  F43.10 desvenlafaxine  (PRISTIQ ) 50 MG 24 hr tablet    4. Attention deficit hyperactivity disorder (ADHD), combined type  F90.2 desvenlafaxine  (PRISTIQ ) 50 MG 24 hr tablet    5. Insomnia due to other mental disorder  F51.05 traZODone  (DESYREL ) 50 MG tablet   F99       Receiving Psychotherapy: No    RECOMMENDATIONS:  Greater than 50% of  30 min face to face time with patient was spent on counseling and coordination of care. Discussed her good improvement with anxiety and depression since switching to Pristiq . Would like to wait another few weeks before considering adjustment.  We also talked about continues problems with sleep. Trazodone  works well but having lucid problematic dreams. Answered questions about pregnancy and continuing psychiatric medications.    We agreed today to:  To continue Pristiq  50 mg daily in the a.m. after breakfast To continue  trazodone  to 100 mg daily at bedtime To start clonidine  0.1 mg daily at bedtime To continue Xanax  0.5 mg  daily as needed for severe anxiety or panic Will report worsening symptoms or side effects promptly Will follow-up in 4 weeks to reassess [facility]-last week, No BC. Counseled on risk of pregnancy while using certain psychiatric medication.  Reviewed PDMP Discussed potential benefits, risk, and side effects of benzodiazepines to include potential risk of tolerance and dependence, as well as possible drowsiness.  Advised patient not to drive if experiencing drowsiness and to take lowest possible effective dose to minimize risk of dependence and tolerance.           Redell DELENA Pizza, NP

## 2024-01-07 DIAGNOSIS — F411 Generalized anxiety disorder: Secondary | ICD-10-CM | POA: Diagnosis not present

## 2024-01-07 NOTE — Progress Notes (Unsigned)
 [name] [address] [place] [address] Phone: [phone] Subjective:   Cheryl [name], am serving as a scribe for Dr. Arthea Claudene.  I'm seeing this patient by the request  of:  Mahlon Comer BRAVO, MD  CC: back and neck pain   [name]  Cheryl [name] is a 23 y.o. female coming in with complaint of back and neck pain. OMT on 11/27/2023. Patient states that her pain is worsening in lumbar spine. Pain is worse without back support.   Medications patient has been prescribed:   Taking:         Reviewed prior external information including notes and imaging from previsou exam, outside providers and external EMR if available.   As well as notes that were available from care everywhere and other healthcare systems.  Past medical history, social, surgical and family history all reviewed in electronic medical record.  No pertanent information unless stated regarding to the chief complaint.   Past Medical History:  Diagnosis Date   Allergy    Anxiety    Depression    PCOS (polycystic ovarian syndrome) 11/2016    No Known Allergies   Review of Systems:  No headache, visual changes, nausea, vomiting, diarrhea,  dizziness, skin rash, fevers, chills, night sweats, weight loss, swollen lymph nodes, body aches, joint swelling, chest pain, shortness of breath, mood changes. POSITIVE muscle aches, constipation, mild abdominal pain   Objective  Blood pressure 110/72, pulse 79, height 5' 7 (1.702 m), SpO2 98%.   General: No apparent distress alert and oriented x3 mood and affect normal, dressed appropriately.  HEENT: Pupils equal, extraocular movements intact  Respiratory: Patient's speak in full sentences and does not appear short of breath  Cardiovascular: No lower extremity edema, non tender, no erythema  MSK:  Back severely tender to palpation noted.  Patient does have tightness noted with FABER test.  Significant tightness in  the paraspinal musculature of the lumbar spine that seems to be out of proportion.  No CVA tenderness.  Osteopathic findings  C3 flexed rotated and side bent right C6 flexed rotated and side bent left T3 extended rotated and side bent right inhaled rib T9 extended rotated and side bent left L3 flexed rotated and side bent right Sacrum right on right       Assessment and Plan:  SI (sacroiliac) joint dysfunction Chronic, with worsening symptoms.  Toradol  and Depo-Medrol  injections given today.  Discussed Zanaflex  again and 4 mg to take at night.  Differential also includes chronic constipation and will get a KUB.  In addition patient does have a history of PCOS and may need to consider the possibility of anything such as a ovarian cyst that could be potentially contributing to some of the back pain.  We will monitor.  Patient knows of worsening pain to seek medical attention.  Follow-up with me again in 4 to 8 weeks    Nonallopathic problems  Decision today to treat with OMT was based on Physical Exam  After verbal consent patient was treated with HVLA, ME, FPR techniques in cervical, rib, thoracic, lumbar, and sacral  areas  Patient tolerated the procedure well with improvement in symptoms  Patient given exercises, stretches and lifestyle modifications  See medications in patient instructions if given  Patient will follow up in 4-8 weeks     The above documentation has been reviewed and is accurate and complete Cheryl Grillot M Eion Timbrook, DO  Note: This dictation was prepared with Dragon dictation along with smaller phrase technology. Any transcriptional errors that result from this process are unintentional.

## 2024-01-09 ENCOUNTER — Encounter: Payer: Self-pay | Admitting: Family Medicine

## 2024-01-09 ENCOUNTER — Ambulatory Visit: Admitting: Family Medicine

## 2024-01-09 ENCOUNTER — Ambulatory Visit (INDEPENDENT_AMBULATORY_CARE_PROVIDER_SITE_OTHER)

## 2024-01-09 VITALS — BP 110/72 | HR 79 | Ht 67.0 in

## 2024-01-09 DIAGNOSIS — M545 Low back pain, unspecified: Secondary | ICD-10-CM

## 2024-01-09 DIAGNOSIS — M533 Sacrococcygeal disorders, not elsewhere classified: Secondary | ICD-10-CM | POA: Diagnosis not present

## 2024-01-09 DIAGNOSIS — M9901 Segmental and somatic dysfunction of cervical region: Secondary | ICD-10-CM

## 2024-01-09 DIAGNOSIS — M9904 Segmental and somatic dysfunction of sacral region: Secondary | ICD-10-CM

## 2024-01-09 DIAGNOSIS — M9902 Segmental and somatic dysfunction of thoracic region: Secondary | ICD-10-CM

## 2024-01-09 DIAGNOSIS — M9903 Segmental and somatic dysfunction of lumbar region: Secondary | ICD-10-CM

## 2024-01-09 DIAGNOSIS — R109 Unspecified abdominal pain: Secondary | ICD-10-CM | POA: Diagnosis not present

## 2024-01-09 DIAGNOSIS — M9908 Segmental and somatic dysfunction of rib cage: Secondary | ICD-10-CM | POA: Diagnosis not present

## 2024-01-09 MED ORDER — METHYLPREDNISOLONE ACETATE 80 MG/ML IJ SUSP
80.0000 mg | Freq: Once | INTRAMUSCULAR | Status: AC
  Administered 2024-01-09: 80 mg via INTRAMUSCULAR

## 2024-01-09 MED ORDER — KETOROLAC TROMETHAMINE 60 MG/2ML IM SOLN
60.0000 mg | Freq: Once | INTRAMUSCULAR | Status: AC
  Administered 2024-01-09: 60 mg via INTRAMUSCULAR

## 2024-01-09 NOTE — Assessment & Plan Note (Signed)
 Chronic, with worsening symptoms.  Toradol  and Depo-Medrol  injections given today.  Discussed Zanaflex  again and 4 mg to take at night.  Differential also includes chronic constipation and will get a KUB.  In addition patient does have a history of PCOS and may need to consider the possibility of anything such as a ovarian cyst that could be potentially contributing to some of the back pain.  We will monitor.  Patient knows of worsening pain to seek medical attention.  Follow-up with me again in 4 to 8 weeks

## 2024-01-09 NOTE — Patient Instructions (Addendum)
 Xray today Full cocktail injections today. August 4th appt

## 2024-01-12 IMAGING — DX DG TIBIA/FIBULA 2V*R*
2 series · 2 of 2 positions shown · non-contrast
Comparison: None.

CLINICAL DATA: Lateral right ankle pain for 1 day after twisting
injury playing basketball.

EXAM:
RIGHT ANKLE - COMPLETE 3+ VIEW; RIGHT FOOT COMPLETE - 3+ VIEW; RIGHT
TIBIA AND FIBULA - 2 VIEW

[tibia ap]
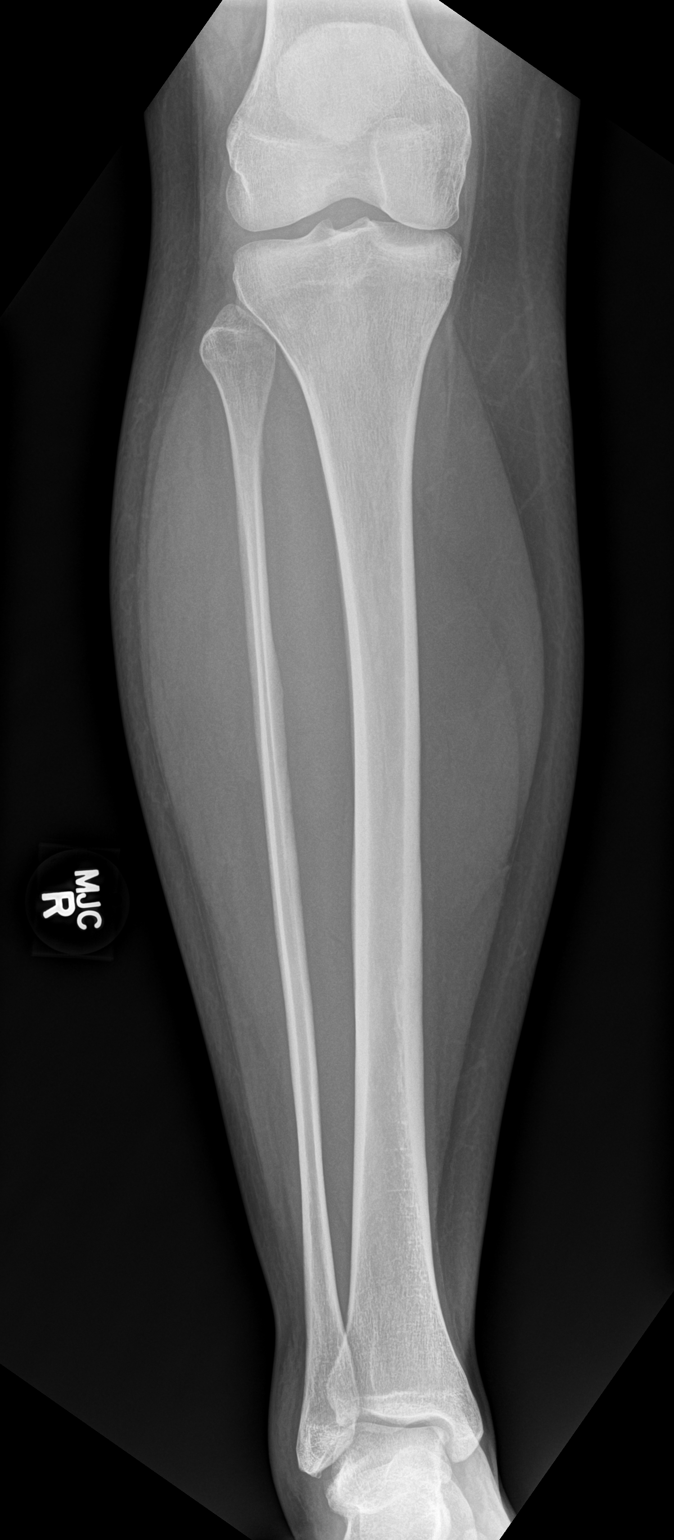

[tibia lat]
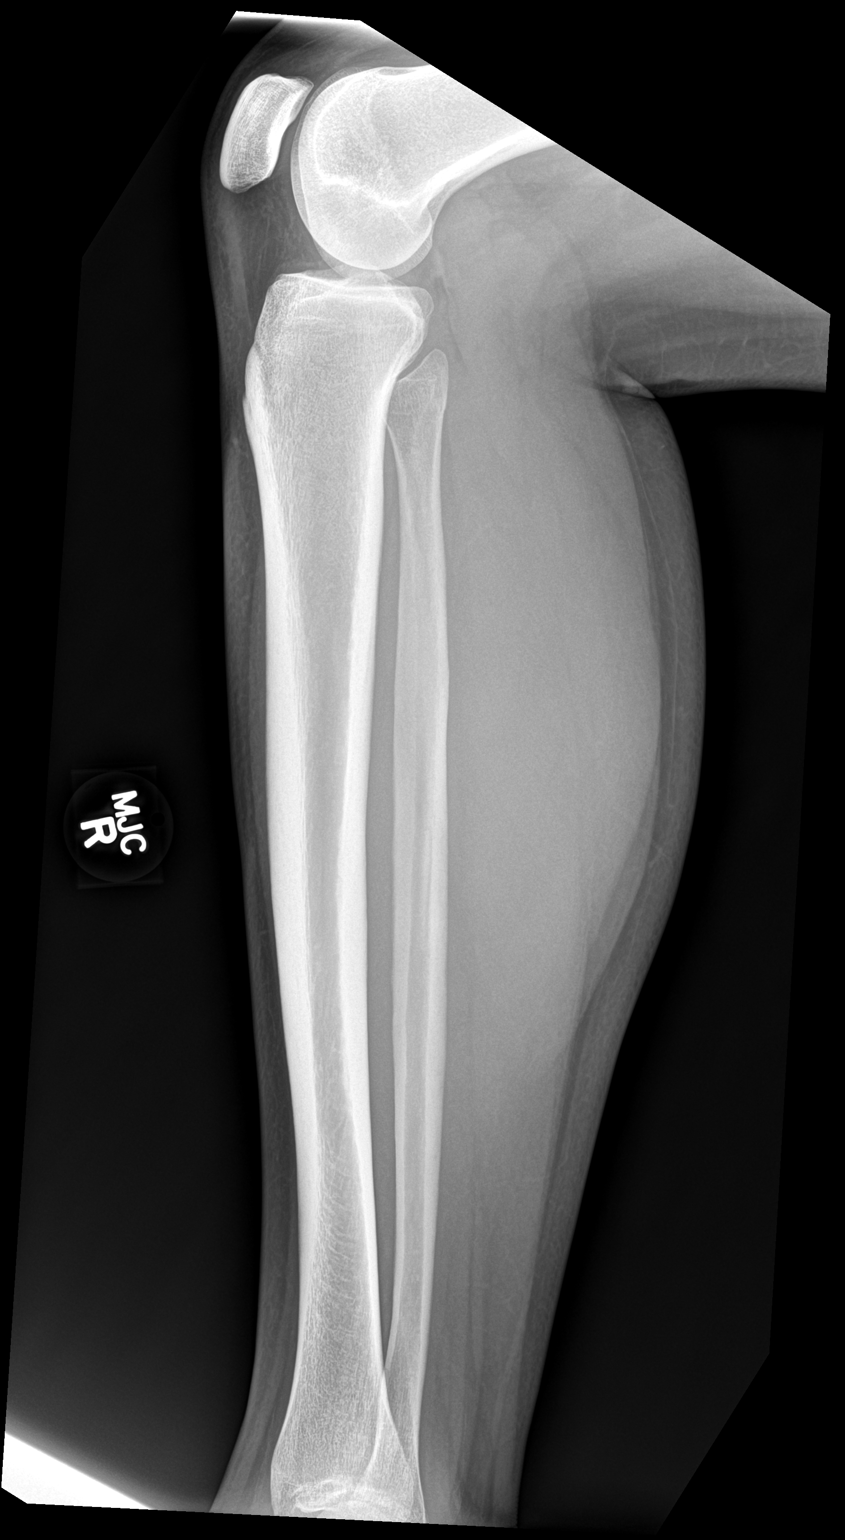

[2 of 2 positions shown; findings below may reference images not displayed]

FINDINGS: Right ankle:

The ankle mortise is symmetric and intact. Moderate lateral
malleolar soft tissue swelling. No acute fracture or dislocation.
Joint spaces are preserved.

Right foot:

Minimal distal lateral metatarsal head degenerative spurring. Joint
spaces are preserved. No acute fracture or dislocation.

Right tibia and fibula:

Normal bone mineralization.  No acute fracture or dislocation.
IMPRESSION: No acute fracture within the right tibia/fibula right ankle or right
foot.

Moderate lateral malleolar soft tissue swelling.

## 2024-01-12 IMAGING — DX DG ANKLE COMPLETE 3+V*R*
3 series · 3 of 3 positions shown · non-contrast
Comparison: None.

CLINICAL DATA: Lateral right ankle pain for 1 day after twisting
injury playing basketball.

EXAM:
RIGHT ANKLE - COMPLETE 3+ VIEW; RIGHT FOOT COMPLETE - 3+ VIEW; RIGHT
TIBIA AND FIBULA - 2 VIEW

[ankle ap]
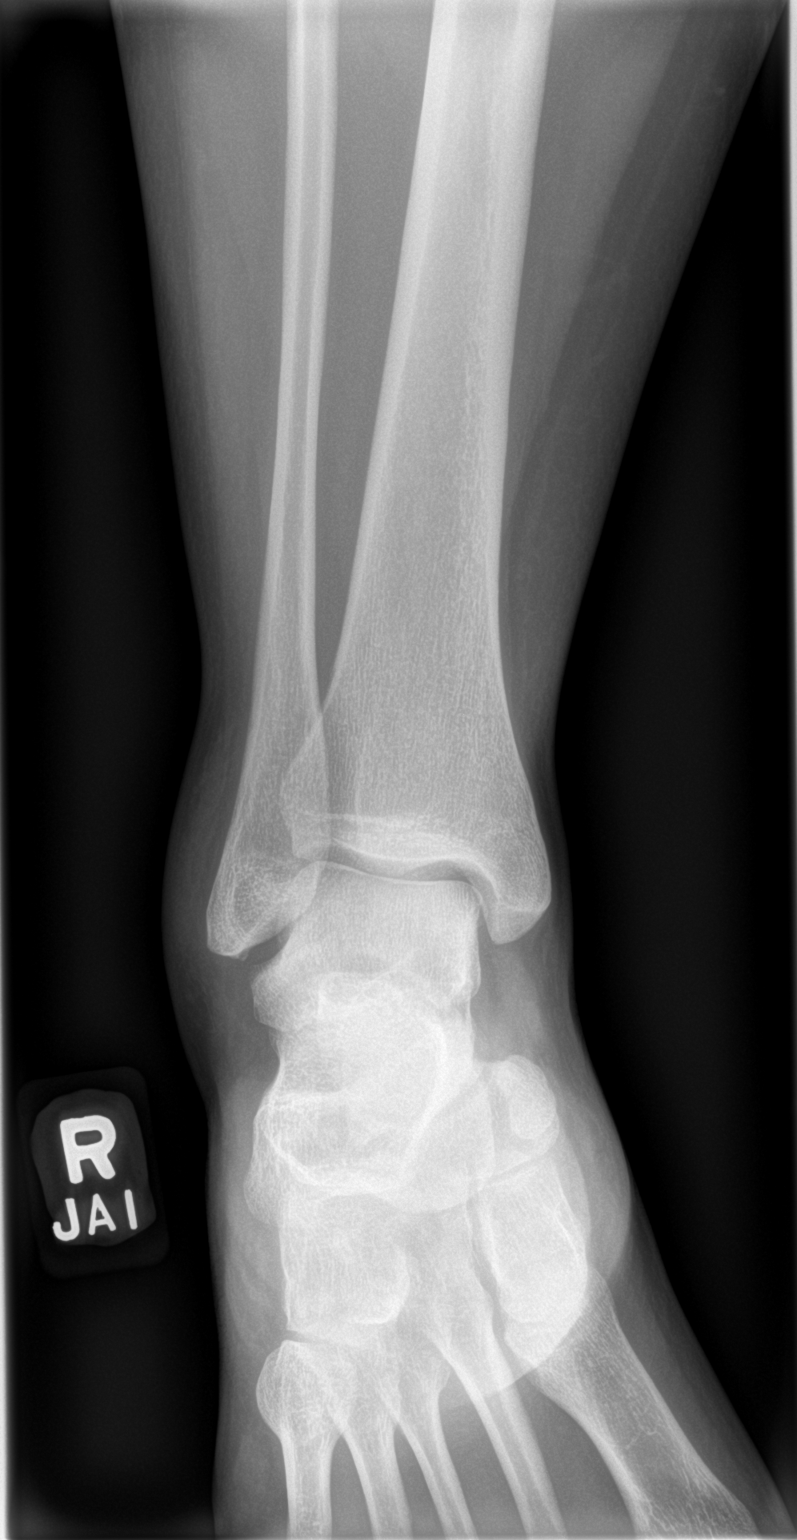

[ankle obl]
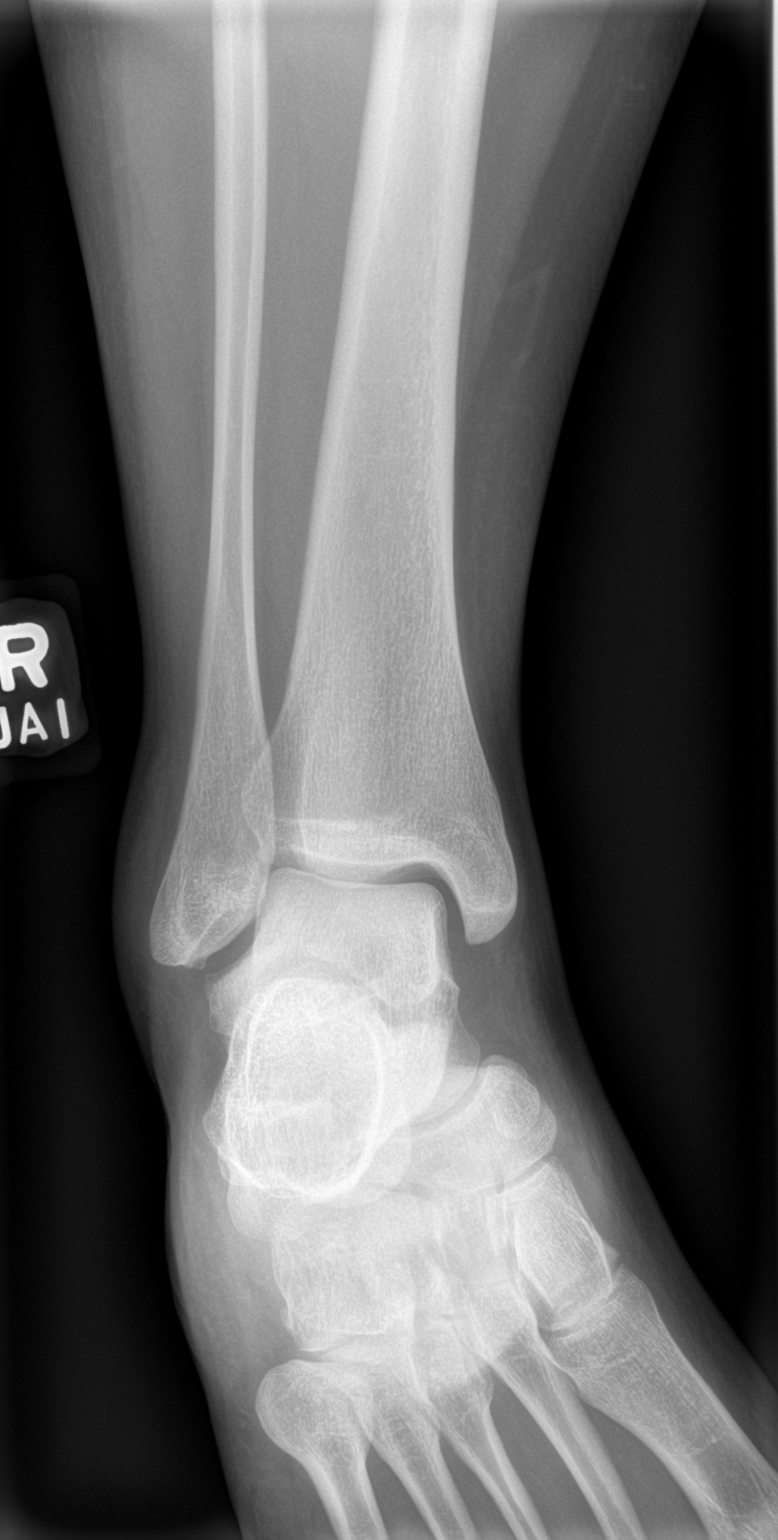

[ankle lat]
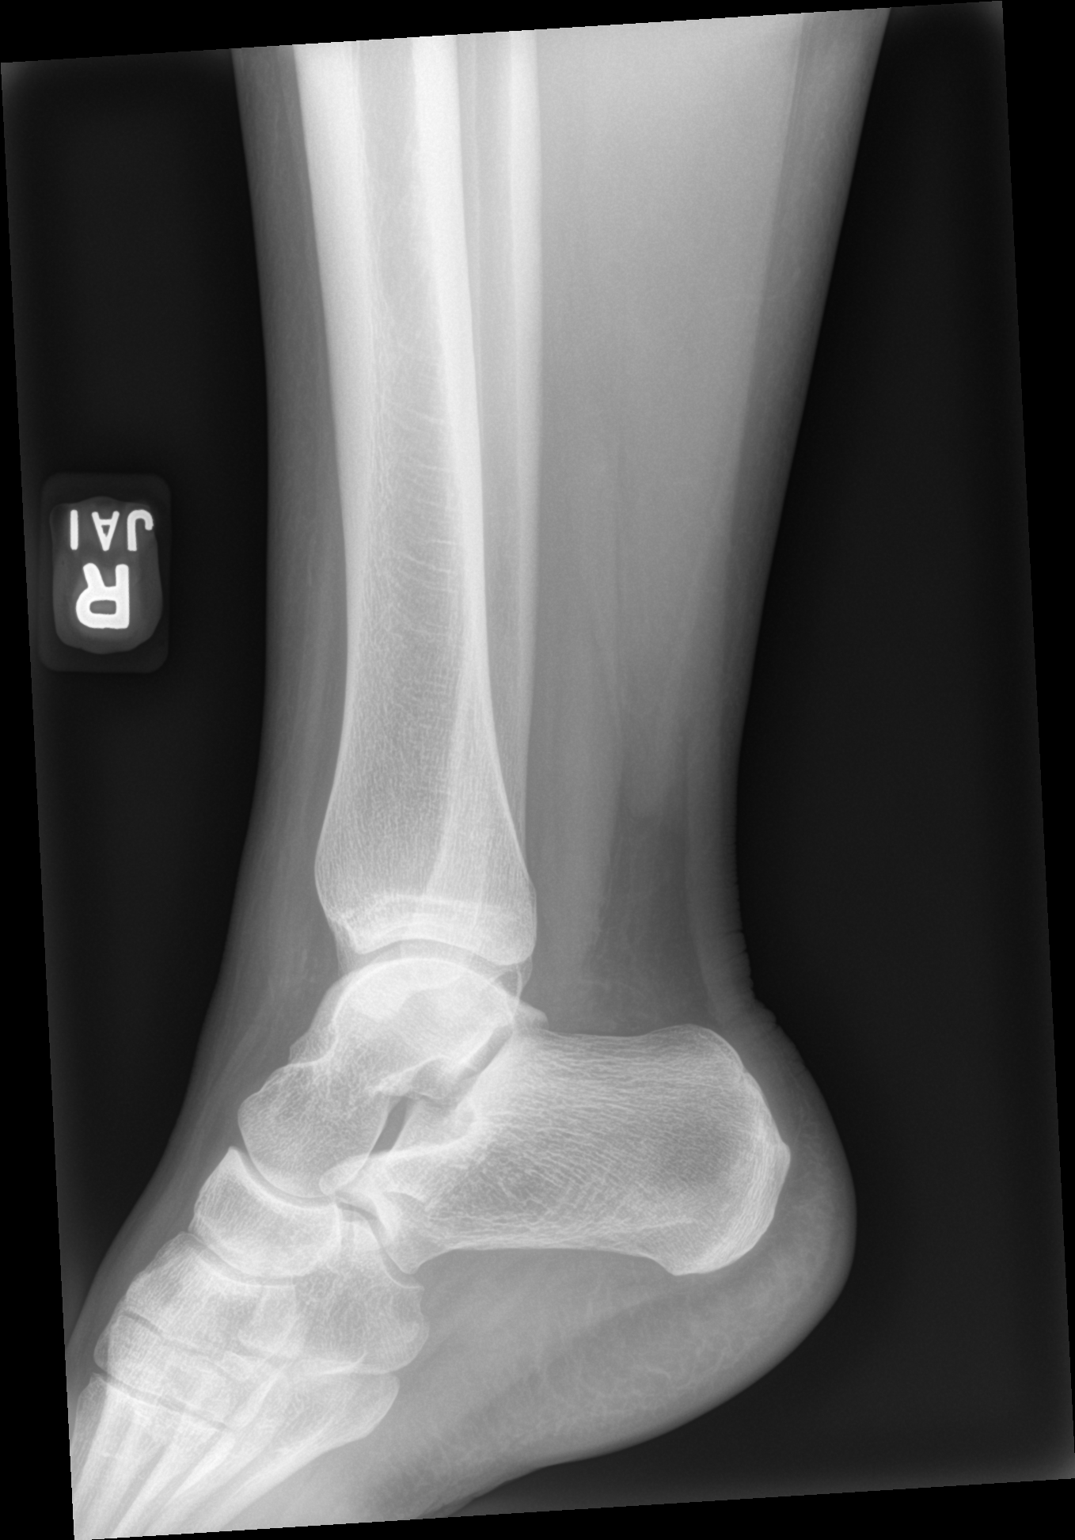

[3 of 3 positions shown; findings below may reference images not displayed]

FINDINGS: Right ankle:

The ankle mortise is symmetric and intact. Moderate lateral
malleolar soft tissue swelling. No acute fracture or dislocation.
Joint spaces are preserved.

Right foot:

Minimal distal lateral metatarsal head degenerative spurring. Joint
spaces are preserved. No acute fracture or dislocation.

Right tibia and fibula:

Normal bone mineralization.  No acute fracture or dislocation.
IMPRESSION: No acute fracture within the right tibia/fibula right ankle or right
foot.

Moderate lateral malleolar soft tissue swelling.

## 2024-01-12 IMAGING — DX DG FOOT COMPLETE 3+V*R*
3 series · 3 of 3 positions shown · non-contrast
Comparison: None.

CLINICAL DATA: Lateral right ankle pain for 1 day after twisting
injury playing basketball.

EXAM:
RIGHT ANKLE - COMPLETE 3+ VIEW; RIGHT FOOT COMPLETE - 3+ VIEW; RIGHT
TIBIA AND FIBULA - 2 VIEW

[foot ap]
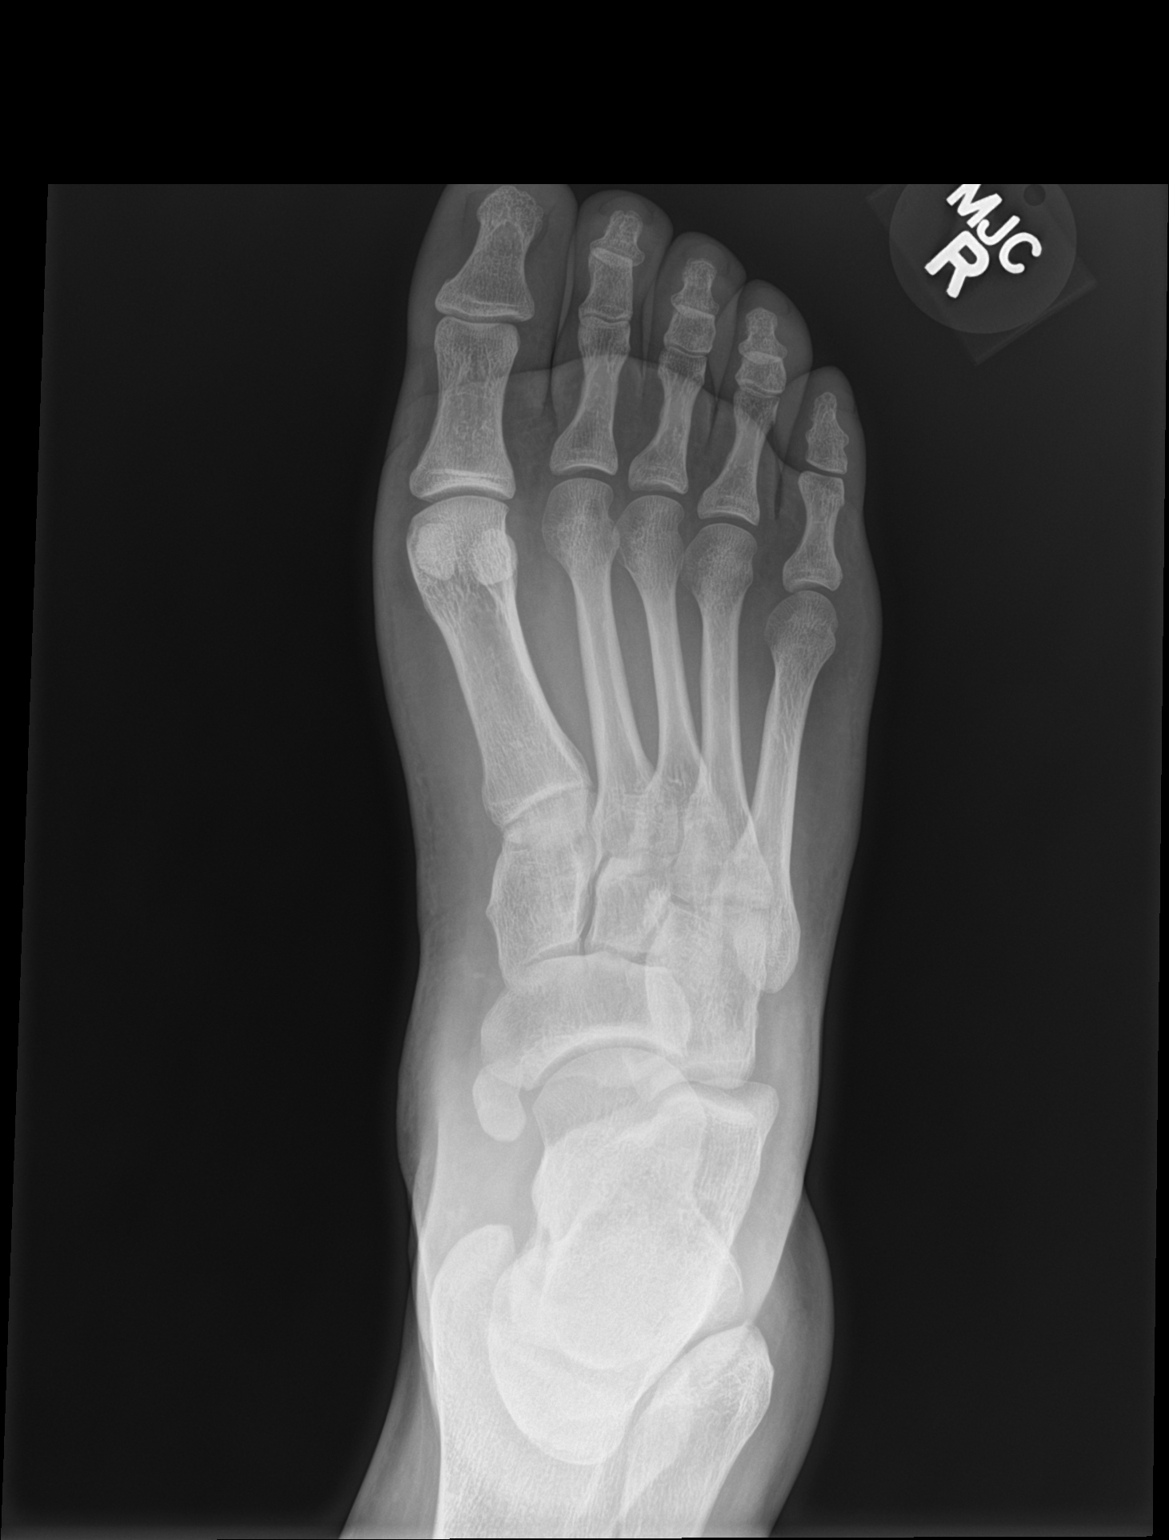

[foot obl]
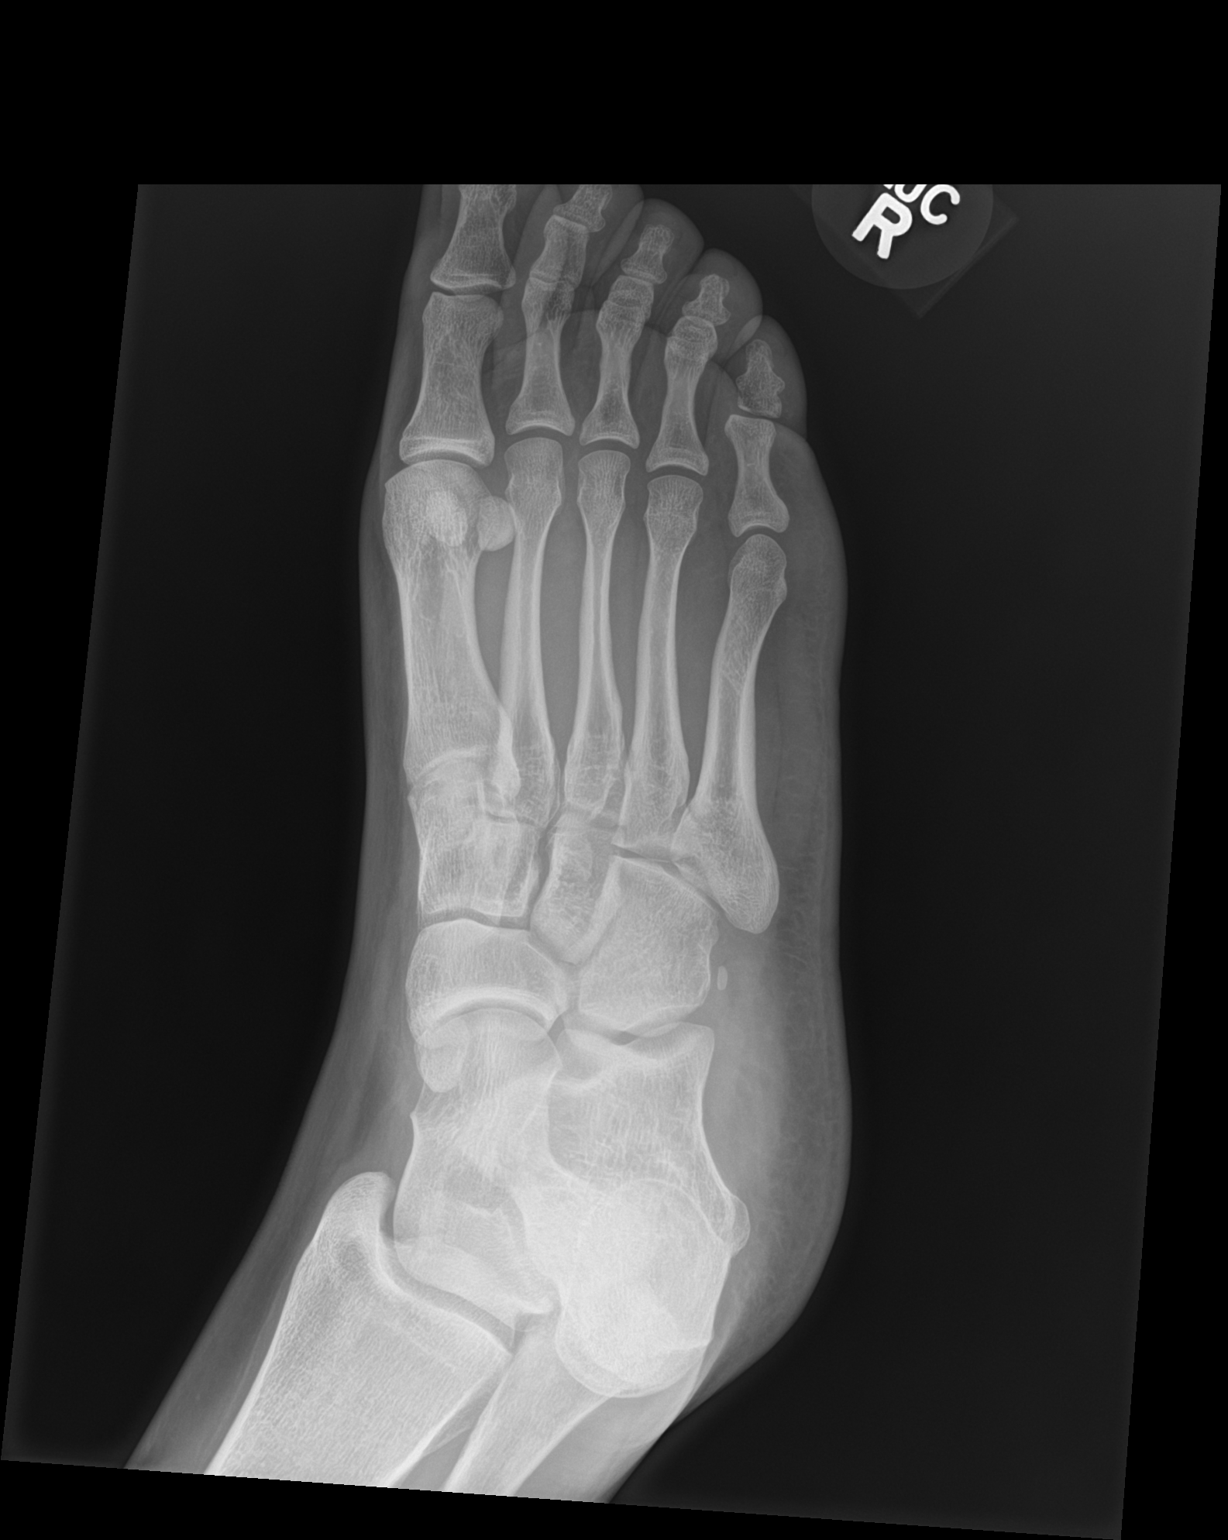

[foot lat]
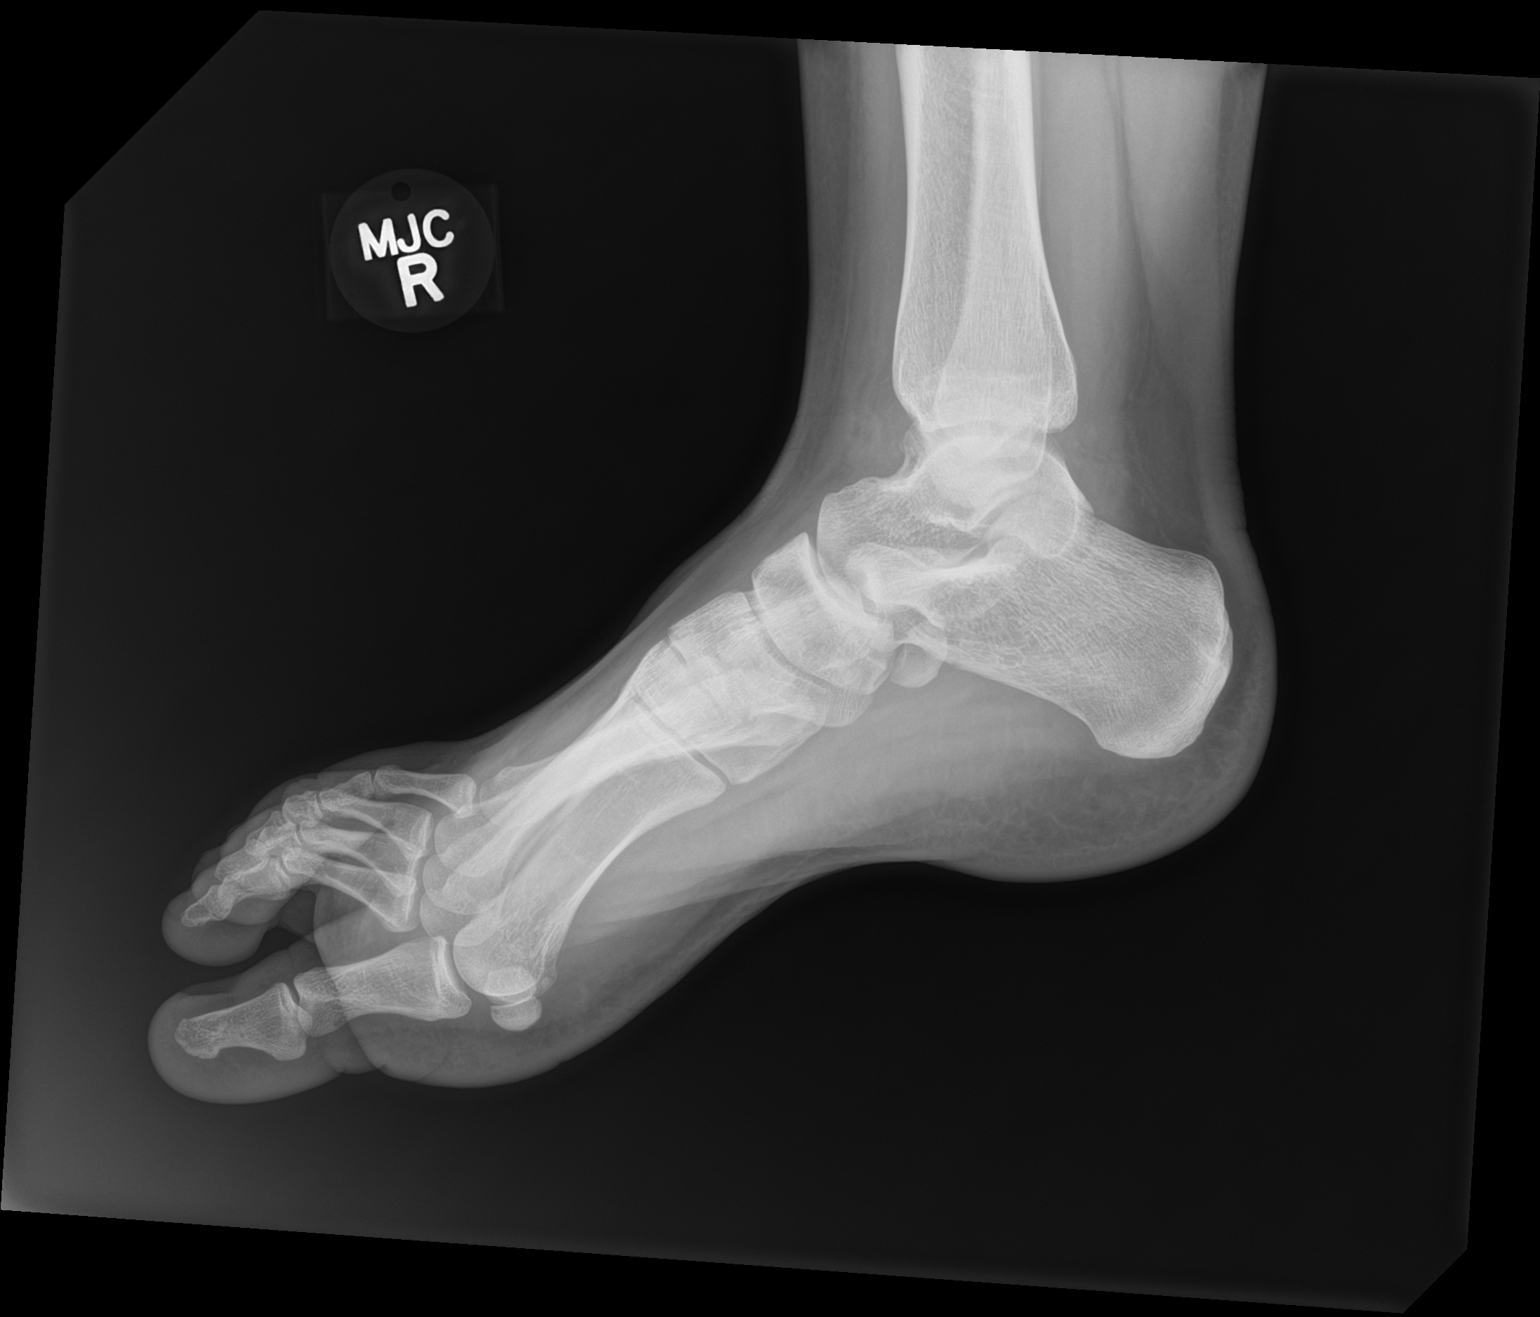

[3 of 3 positions shown; findings below may reference images not displayed]

FINDINGS: Right ankle:

The ankle mortise is symmetric and intact. Moderate lateral
malleolar soft tissue swelling. No acute fracture or dislocation.
Joint spaces are preserved.

Right foot:

Minimal distal lateral metatarsal head degenerative spurring. Joint
spaces are preserved. No acute fracture or dislocation.

Right tibia and fibula:

Normal bone mineralization.  No acute fracture or dislocation.
IMPRESSION: No acute fracture within the right tibia/fibula right ankle or right
foot.

Moderate lateral malleolar soft tissue swelling.

## 2024-01-14 ENCOUNTER — Other Ambulatory Visit (HOSPITAL_COMMUNITY): Payer: Self-pay

## 2024-01-14 DIAGNOSIS — N926 Irregular menstruation, unspecified: Secondary | ICD-10-CM | POA: Diagnosis not present

## 2024-01-14 DIAGNOSIS — R1031 Right lower quadrant pain: Secondary | ICD-10-CM | POA: Diagnosis not present

## 2024-01-14 DIAGNOSIS — R102 Pelvic and perineal pain: Secondary | ICD-10-CM | POA: Diagnosis not present

## 2024-01-14 MED ORDER — MEDROXYPROGESTERONE ACETATE 10 MG PO TABS
10.0000 mg | ORAL_TABLET | Freq: Every day | ORAL | 0 refills | Status: DC
  Filled 2024-01-14: qty 10, 10d supply, fill #0

## 2024-01-15 ENCOUNTER — Other Ambulatory Visit (HOSPITAL_COMMUNITY): Payer: Self-pay

## 2024-01-15 ENCOUNTER — Ambulatory Visit (INDEPENDENT_AMBULATORY_CARE_PROVIDER_SITE_OTHER): Admitting: Gastroenterology

## 2024-01-15 VITALS — BP 117/82 | HR 67 | Temp 98.1°F | Ht 66.0 in | Wt 199.0 lb

## 2024-01-15 DIAGNOSIS — G8929 Other chronic pain: Secondary | ICD-10-CM

## 2024-01-15 DIAGNOSIS — M7918 Myalgia, other site: Secondary | ICD-10-CM

## 2024-01-15 DIAGNOSIS — K5900 Constipation, unspecified: Secondary | ICD-10-CM

## 2024-01-15 NOTE — Progress Notes (Signed)
 Gastroenterology Consultation  Referring Provider:     Mahlon Comer BRAVO, MD Primary Care Physician:  Mahlon Comer BRAVO, MD Primary Gastroenterologist:  Dr. Jinny     Reason for Consultation:     Abdominal pain        HPI:   Cheryl [name] is a 23 y.o. y/o female referred for consultation & management of abdominal pain by Dr. Mahlon, Comer BRAVO, MD. This patient comes in today with a history of abdominal pain.  She states it started about a month ago.  Before that she reports that her abdominal pain was not present although she would have some intermittent constipation she states that it was not a regular thing.  She denies any family history of colon cancer or colon polyps.  She also denies of the abdominal pain is waking her up from a sound sleep.  Over the last month she has been having some worsening constipation with bowel movements every day but they were small and hard.  She denies any rectal bleeding.  The symptoms are not associated with any weight loss and in fact she reports she has gained 50 pounds.  She suffers from polycystic ovary syndrome and has been seen by OB/GYN.  She reports that she had an ultrasound yesterday that did not show any cysts to account for her abdominal pain. The patient is on multiple different medications given to her by her psychiatrist which she feels may have some contributing effects to her constipation. She does report that the abdominal pain is worse when she moves and feels better when she is laying down with her knees bent.  The pain is reported to be a dull constant pain that appears to be laterally in the front of her right side but also radiates to the back.  Past Medical History:  Diagnosis Date   Allergy    Anxiety    Depression    PCOS (polycystic ovarian syndrome) 11/2016    Past Surgical History:  Procedure Laterality Date   WISDOM TOOTH EXTRACTION     WISDOM TOOTH EXTRACTION      Prior to Admission medications   Medication  Sig Start Date End Date Taking? Authorizing Provider  ALPRAZolam  (XANAX ) 0.5 MG tablet Take 1 tablet (0.5 mg total) by mouth as needed. 12/02/23  Yes White, Redell A, NP  cloNIDine  (CATAPRES ) 0.1 MG tablet Take 1 tablet (0.1 mg total) by mouth at bedtime. 01/01/24  Yes Teresa Redell A, NP  desvenlafaxine  (PRISTIQ ) 50 MG 24 hr tablet Take 1 tablet (50 mg total) by mouth daily. 01/01/24  Yes Teresa Redell A, NP  EPINEPHrine 0.3 mg/0.3 mL IJ SOAJ injection  11/30/15  Yes [provider]  fluticasone  (FLONASE ) 50 MCG/ACT nasal spray Place 2 sprays into both nostrils daily. 05/27/23  Yes Moishe Chiquita HERO, NP  loratadine -pseudoephedrine (CLARITIN -D 24 HOUR) 10-240 MG 24 hr tablet Take 1 tablet by mouth daily. 01/14/20  Yes Tabori, Katherine E, MD  traZODone  (DESYREL ) 100 MG tablet Take 1 tablet (100 mg total) by mouth at bedtime. 12/02/23  Yes Teresa Redell LABOR, NP    Family History  Problem Relation Age of Onset   Depression Mother    Anxiety disorder Mother    Hyperlipidemia Mother    Hypertension Mother    Hypertension Father    Alcohol abuse Sister    Depression Maternal Uncle    Hypertension Maternal Grandfather    Drug abuse Maternal Grandfather    Hypertension Maternal Grandmother  Social History   Tobacco Use   Smoking status: Former    Current packs/day: 0.00    Types: Cigarettes    Quit date: 10/21/2017    Years since quitting: 6.2   Smokeless tobacco: Never  Vaping Use   Vaping status: Some Days  Substance Use Topics   Alcohol use: No    Comment: 4 drinks/coctails per week   Drug use: No    Allergies as of 01/15/2024   (No Known Allergies)    Review of Systems:    All systems reviewed and negative except where noted in HPI.   Physical Exam:  BP 117/82 (BP Location: Left Arm, Patient Position: Sitting, Cuff Size: Normal)   Pulse 67   Temp 98.1 F (36.7 C) (Oral)   Ht 5' 6 (1.676 m)   Wt 199 lb (90.3 kg)   LMP 12/04/2023 (Approximate) Comment: Patient has PCOP,  periods are very abnormal.  BMI 32.12 kg/m  Patient's last menstrual period was 12/04/2023 (approximate). General:   Alert,  Well-developed, well-nourished, pleasant and cooperative in NAD Head:  Normocephalic and atraumatic. Eyes:  Sclera clear, no icterus.   Conjunctiva pink. Ears:  Normal auditory acuity. Abdomen:  Normal bowel sounds.  No bruits.  Soft, positive abdominal wall tenderness with flexion of the abdominal wall muscles by lifting the patient's leg 6 inches above the exam table and palpating the abdominal wall with 1 finger. Non-distended without masses, hepatosplenomegaly or hernias noted.  No guarding or rebound tenderness.  Positive Carnett sign.   Rectal:  Deferred.  Neurologic:  Alert and oriented x3;  grossly normal neurologically. Skin:  Intact without significant lesions or rashes.  No jaundice. Psych:  Alert and cooperative. Normal mood and affect.  Imaging Studies: No results found.  Assessment and Plan:   Cheryl [name] is a 23 y.o. y/o female who comes in with right lower quadrant abdominal discomfort that is really in the mid right abdomen.  The physical exam is consistent with musculoskeletal pain with a positive Carnett's sign which is reproducible abdominal pain with 1 finger palpation while flexing the abdominal wall muscles.  The patient also had pain from just doing a reverse sit up by laying down on the exam table and feels better with her knees bent thereby shortening the abdominal wall muscles. The patient is devoid of any worrisome signs such as rectal bleeding, unexplained weight loss or family history of colon cancer or colon polyps.  The pain also does not wake the patient up from a sound sleep.  I recommended the patient take Advil  400 mg 3 times a day for up to 2 weeks and to stop if she should have any pain worsening or black stools.  She has also been told if she has any nausea vomiting she should also stop the Advil .  I have considered putting her  on Flexeril  but due to her other medications I will leave that to be reviewed by the patient and her psychiatrist to see if it would be safe to add either Robaxin or Flexeril .  She has also been told to avoid straining that muscle and to use warm compresses on the area to help relieve the muscle pain.  The patient has also been told to take MiraLAX to avoid constipation which can also increase the straining of the abdominal wall muscles from the constipation.  The patient has been explained the plan and agrees with it.    Rogelia Copping, MD. NOLIA    Note: This  dictation was prepared with Dragon dictation along with smaller phrase technology. Any transcriptional errors that result from this process are unintentional.

## 2024-01-15 NOTE — Progress Notes (Deleted)
  [name] [address] [place] [address] Phone: [phone] Subjective:    I'm seeing this patient by the request  of:  Mahlon Comer BRAVO, MD  CC:   [name]  Cheryl [name] is a 23 y.o. female coming in with complaint of back and neck pain. OMT 01/09/2024. Patient states   Medications patient has been prescribed: None  Taking:         Reviewed prior external information including notes and imaging from previsou exam, outside providers and external EMR if available.   As well as notes that were available from care everywhere and other healthcare systems.  Past medical history, social, surgical and family history all reviewed in electronic medical record.  No pertanent information unless stated regarding to the chief complaint.   Past Medical History:  Diagnosis Date   Allergy    Anxiety    Depression    PCOS (polycystic ovarian syndrome) 11/2016    No Known Allergies   Review of Systems:  No headache, visual changes, nausea, vomiting, diarrhea, constipation, dizziness, abdominal pain, skin rash, fevers, chills, night sweats, weight loss, swollen lymph nodes, body aches, joint swelling, chest pain, shortness of breath, mood changes. POSITIVE muscle aches  Objective  Last menstrual period 12/04/2023.   General: No apparent distress alert and oriented x3 mood and affect normal, dressed appropriately.  HEENT: Pupils equal, extraocular movements intact  Respiratory: Patient's speak in full sentences and does not appear short of breath  Cardiovascular: No lower extremity edema, non tender, no erythema  Gait MSK:  Back   Osteopathic findings  C2 flexed rotated and side bent right C6 flexed rotated and side bent left T3 extended rotated and side bent right inhaled rib T9 extended rotated and side bent left L2 flexed rotated and side bent right Sacrum right on right       Assessment and Plan:  No  problem-specific Assessment & Plan notes found for this encounter.    Nonallopathic problems  Decision today to treat with OMT was based on Physical Exam  After verbal consent patient was treated with HVLA, ME, FPR techniques in cervical, rib, thoracic, lumbar, and sacral  areas  Patient tolerated the procedure well with improvement in symptoms  Patient given exercises, stretches and lifestyle modifications  See medications in patient instructions if given  Patient will follow up in 4-8 weeks             Note: This dictation was prepared with Dragon dictation along with smaller phrase technology. Any transcriptional errors that result from this process are unintentional.

## 2024-01-20 ENCOUNTER — Ambulatory Visit: Admitting: Family Medicine

## 2024-01-22 DIAGNOSIS — F411 Generalized anxiety disorder: Secondary | ICD-10-CM | POA: Diagnosis not present

## 2024-01-23 DIAGNOSIS — F411 Generalized anxiety disorder: Secondary | ICD-10-CM | POA: Diagnosis not present

## 2024-01-30 DIAGNOSIS — F411 Generalized anxiety disorder: Secondary | ICD-10-CM | POA: Diagnosis not present

## 2024-02-01 ENCOUNTER — Other Ambulatory Visit (HOSPITAL_COMMUNITY): Payer: Self-pay

## 2024-02-04 ENCOUNTER — Ambulatory Visit (INDEPENDENT_AMBULATORY_CARE_PROVIDER_SITE_OTHER): Payer: Self-pay | Admitting: Behavioral Health

## 2024-02-04 DIAGNOSIS — Z91199 Patient's noncompliance with other medical treatment and regimen due to unspecified reason: Secondary | ICD-10-CM

## 2024-02-04 NOTE — Progress Notes (Signed)
 Pt did not show for appt and did not provide 24 hour access as required. Additional fees to be assessed.

## 2024-02-11 ENCOUNTER — Ambulatory Visit: Admitting: Behavioral Health

## 2024-02-11 ENCOUNTER — Other Ambulatory Visit (HOSPITAL_COMMUNITY): Payer: Self-pay

## 2024-02-11 ENCOUNTER — Encounter: Payer: Self-pay | Admitting: Behavioral Health

## 2024-02-11 DIAGNOSIS — F902 Attention-deficit hyperactivity disorder, combined type: Secondary | ICD-10-CM | POA: Diagnosis not present

## 2024-02-11 DIAGNOSIS — F331 Major depressive disorder, recurrent, moderate: Secondary | ICD-10-CM | POA: Diagnosis not present

## 2024-02-11 DIAGNOSIS — F411 Generalized anxiety disorder: Secondary | ICD-10-CM

## 2024-02-11 DIAGNOSIS — F4310 Post-traumatic stress disorder, unspecified: Secondary | ICD-10-CM

## 2024-02-11 DIAGNOSIS — F99 Mental disorder, not otherwise specified: Secondary | ICD-10-CM | POA: Diagnosis not present

## 2024-02-11 DIAGNOSIS — F5105 Insomnia due to other mental disorder: Secondary | ICD-10-CM

## 2024-02-11 MED ORDER — DESVENLAFAXINE SUCCINATE ER 100 MG PO TB24
100.0000 mg | ORAL_TABLET | Freq: Every day | ORAL | 3 refills | Status: DC
  Filled 2024-02-11 – 2024-02-26 (×2): qty 30, 30d supply, fill #0
  Filled 2024-03-31: qty 30, 30d supply, fill #1
  Filled 2024-05-04: qty 30, 30d supply, fill #2
  Filled 2024-06-04: qty 30, 30d supply, fill #3

## 2024-02-11 MED ORDER — TRAZODONE HCL 100 MG PO TABS
100.0000 mg | ORAL_TABLET | Freq: Every day | ORAL | 2 refills | Status: AC
Start: 2024-02-11 — End: ?
  Filled 2024-02-11 – 2024-02-26 (×2): qty 30, 30d supply, fill #0
  Filled 2024-03-31: qty 30, 30d supply, fill #1
  Filled 2024-05-04: qty 30, 30d supply, fill #2

## 2024-02-11 MED ORDER — ALPRAZOLAM 0.5 MG PO TABS
0.5000 mg | ORAL_TABLET | ORAL | 2 refills | Status: DC | PRN
  Filled 2024-02-11 – 2024-03-31 (×3): qty 30, 30d supply, fill #0
  Filled 2024-05-04: qty 30, 30d supply, fill #1
  Filled 2024-06-04: qty 30, 30d supply, fill #2

## 2024-02-11 NOTE — Progress Notes (Signed)
 Cheryl [name] [id number] 10-24-2000 23 y.o.  Virtual Visit via Telephone Note  I connected with pt on 02/11/24 at 10:30 AM EDT by telephone and verified that I am speaking with the correct person using two identifiers.   I discussed the limitations, risks, security and privacy concerns of performing an evaluation and management service by telephone and the availability of in person appointments. I also discussed with the patient that there may be a patient responsible charge related to this service. The patient expressed understanding and agreed to proceed.   I discussed the assessment and treatment plan with the patient. The patient was provided an opportunity to ask questions and all were answered. The patient agreed with the plan and demonstrated an understanding of the instructions.   The patient was advised to call back or seek an in-person evaluation if the symptoms worsen or if the condition fails to improve as anticipated.  I provided 30 minutes of non-face-to-face time during this encounter.  The patient was located at home.  The provider was located at [facility].   Cheryl DELENA Pizza, NP   Subjective:   Patient ID:  Cheryl [name] is a 23 y.o. (DOB 2001/01/23) female.  Chief Complaint:  Chief Complaint  Patient presents with   Anxiety   Depression   ADHD   Medication Refill   Follow-up   Patient Education   Medication Problem    HPI  [place], 23 year old female presents to this office follow up and medication management. Collateral information should be considered reliable. Still reporting moderate improvement with Pristiq  but still having undesirable levels of anxiety and depression. She is requesting increase with Pristiq  this visit. Continues to experience lucid dreams with the Trazodone  but somewhat improved. Reports she will be starting back to school in October and would like to consider restart of long acting stimulant. She numerically rates her  depression at 3/10, and anxiety at  3/10.  Denies any current mania, no psychosis, no auditory or visual hallucinations.  Denies SI or HI.   Prior psychiatric medication trials: Prozac  Trazodone  Concerta  Wellbutrin  Vyvanse    Review of Systems:  Review of Systems  Constitutional: Negative.   Allergic/Immunologic: Negative.   Neurological: Negative.   Psychiatric/Behavioral:  Positive for dysphoric mood. The patient is nervous/anxious.     Medications: I have reviewed the patient's current medications.  Current Outpatient Medications  Medication Sig Dispense Refill   desvenlafaxine  (PRISTIQ ) 100 MG 24 hr tablet Take 1 tablet (100 mg total) by mouth daily. 30 tablet 3   ALPRAZolam  (XANAX ) 0.5 MG tablet Take 1 tablet (0.5 mg total) by mouth as needed. 30 tablet 2   cloNIDine  (CATAPRES ) 0.1 MG tablet Take 1 tablet (0.1 mg total) by mouth at bedtime. 30 tablet 11   EPINEPHrine 0.3 mg/0.3 mL IJ SOAJ injection   1   fluticasone  (FLONASE ) 50 MCG/ACT nasal spray Place 2 sprays into both nostrils daily. 16 g 0   loratadine -pseudoephedrine (CLARITIN -D 24 HOUR) 10-240 MG 24 hr tablet Take 1 tablet by mouth daily. 30 tablet 0   traZODone  (DESYREL ) 100 MG tablet Take 1 tablet (100 mg total) by mouth at bedtime. 30 tablet 2   No current facility-administered medications for this visit.    Medication Side Effects: None  Allergies: No Known Allergies  Past Medical History:  Diagnosis Date   Allergy    Anxiety    Depression    PCOS (polycystic ovarian syndrome) 11/2016    Family History  Problem Relation Age of  Onset   Depression Mother    Anxiety disorder Mother    Hyperlipidemia Mother    Hypertension Mother    Hypertension Father    Alcohol abuse Sister    Depression Maternal Uncle    Hypertension Maternal Grandfather    Drug abuse Maternal Grandfather    Hypertension Maternal Grandmother     Social History   Socioeconomic History   Marital status: Single    Spouse  name: n/a   Number of children: 0   Years of education: 15   Highest education level: High school graduate  Occupational History   Occupation: Dentist:  minor    Comment: [employer] Early Education officer, environmental at [employer]   Occupation: Health and safety inspector: Production assistant, radio FOR SELF EMPLOYED  Tobacco Use   Smoking status: Former    Current packs/day: 0.00    Types: Cigarettes    Quit date: 10/21/2017    Years since quitting: 6.3   Smokeless tobacco: Never  Vaping Use   Vaping status: Some Days  Substance and Sexual Activity   Alcohol use: No    Comment: 4 drinks/coctails per week   Drug use: No   Sexual activity: Not Currently  Other Topics Concern   Not on file  Social History Narrative   Living alone house alone in [place].  Experiencing anhedonia, no hobbies right now.    Social Drivers of Corporate investment banker Strain: Not on file  Food Insecurity: No Food Insecurity (11/19/2017)   Hunger Vital Sign    Worried About Running Out of Food in the Last Year: Never true    Ran Out of Food in the Last Year: Never true  Transportation Needs: No Transportation Needs (11/19/2017)   PRAPARE - Administrator, Civil Service (Medical): No    Lack of Transportation (Non-Medical): No  Physical Activity: Insufficiently Active (11/19/2017)   Exercise Vital Sign    Days of Exercise per Week: 1 day    Minutes of Exercise per Session: 30 min  Stress: Stress Concern Present (11/19/2017)   [employer] of Occupational Health - Occupational Stress Questionnaire    Feeling of Stress : Rather much  Social Connections: Moderately Integrated (11/19/2017)   Social Connection and Isolation Panel    Frequency of Communication with Friends and Family: More than three times a week    Frequency of Social Gatherings with Friends and Family: More than three times a week    Attends Religious Services: More than 4 times per year    Active Member of [employer] or Organizations: Yes     Attends Banker Meetings: More than 4 times per year    Marital Status: Never married  Intimate Partner Violence: Not At Risk (11/19/2017)   Humiliation, Afraid, Rape, and Kick questionnaire    Fear of Current or Ex-Partner: No    Emotionally Abused: No    Physically Abused: No    Sexually Abused: No    Past Medical History, Surgical history, Social history, and Family history were reviewed and updated as appropriate.   Please see review of systems for further details on the patient's review from today.   Objective:   Physical Exam:  There were no vitals taken for this visit.  Physical Exam Neurological:     Mental Status: She is alert and oriented to person, place, and time.  Psychiatric:        Attention and Perception: Attention and perception normal.  Mood and Affect: Mood normal.        Speech: Speech normal.        Behavior: Behavior normal. Behavior is cooperative.        Cognition and Memory: Cognition and memory normal.        Judgment: Judgment normal.     Comments: Insight intact     Lab Review:     Component Value Date/Time   NA 135 07/19/2022 1329   K 4.2 07/19/2022 1329   CL 101 07/19/2022 1329   CO2 28 07/19/2022 1329   GLUCOSE 80 07/19/2022 1329   BUN 10 07/19/2022 1329   CREATININE 0.87 07/19/2022 1329   CREATININE 0.85 01/16/2019 1503   CALCIUM  9.3 07/19/2022 1329   PROT 7.7 07/19/2022 1329   ALBUMIN 4.1 07/19/2022 1329   AST 16 07/19/2022 1329   ALT 14 07/19/2022 1329   ALKPHOS 41 07/19/2022 1329   BILITOT 0.3 07/19/2022 1329       Component Value Date/Time   WBC 6.7 12/08/2020 1032   RBC 5.20 (H) 12/08/2020 1032   HGB 14.4 12/08/2020 1032   HCT 43.3 12/08/2020 1032   PLT 258.0 12/08/2020 1032   MCV 83.2 12/08/2020 1032   MCH 27.0 01/16/2019 1503   MCHC 33.4 12/08/2020 1032   RDW 14.2 12/08/2020 1032   LYMPHSABS 1.6 12/08/2020 1032   MONOABS 0.4 12/08/2020 1032   EOSABS 0.1 12/08/2020 1032   BASOSABS 0.0 12/08/2020  1032    No results found for: POCLITH, LITHIUM   No results found for: PHENYTOIN, PHENOBARB, VALPROATE, CBMZ   .res Assessment: Plan:    Greater than 50% of  30 min face to face time with patient was spent on counseling and coordination of care. Discussed her moderate improvement with Pristiq  but still having some continued anxiety and depression. We discussed increasing her dose today. We also will be scheduling her next appt before school starts to address her ADHD concerns.  We also talked about continues problems with sleep. Trazodone  works well but having lucid problematic dreams.    We agreed today to:   To increase Pristiq  to 100 mg daily in the a.m. after breakfast To continue  trazodone  to 100 mg daily at bedtime To continue clonidine  0.1 mg daily at bedtime To continue Xanax  0.5 mg daily as needed for severe anxiety or panic Will report worsening symptoms or side effects promptly Will follow-up in 6 weeks to reassess [facility]-last week, No BC. Counseled on risk of pregnancy while using certain psychiatric medication.  Reviewed PDMP Discussed potential benefits, risk, and side effects of benzodiazepines to include potential risk of tolerance and dependence, as well as possible drowsiness.  Advised patient not to drive if experiencing drowsiness and to take lowest possible effective dose to minimize risk of dependence and tolerance.     Cheryl DELENA Pizza, NP        Youlanda was seen today for anxiety, depression, adhd, medication refill, follow-up, patient education and medication problem.  Diagnoses and all orders for this visit:  Major depressive disorder, recurrent episode, moderate (HCC)  Insomnia due to other mental disorder -     desvenlafaxine  (PRISTIQ ) 100 MG 24 hr tablet; Take 1 tablet (100 mg total) by mouth daily. -     ALPRAZolam  (XANAX ) 0.5 MG tablet; Take 1 tablet (0.5 mg total) by mouth as needed. -     traZODone  (DESYREL ) 100 MG tablet; Take 1  tablet (100 mg total) by mouth at bedtime.  Generalized anxiety  disorder  PTSD (post-traumatic stress disorder)  Attention deficit hyperactivity disorder (ADHD), combined type    Please see After Visit Summary for patient specific instructions.  No future appointments.  No orders of the defined types were placed in this encounter.     -------------------------------

## 2024-02-21 ENCOUNTER — Other Ambulatory Visit (HOSPITAL_COMMUNITY): Payer: Self-pay

## 2024-02-23 NOTE — Progress Notes (Unsigned)
  [name] [address] [place] [address] Phone: [phone] Subjective:   ISusannah Gully, am serving as a scribe for Dr. Arthea Claudene.  I'm seeing this patient by the request  of:  Mahlon Comer BRAVO, MD  CC: Back and neck pain follow-up  [name]  Cheryl [name] is a 23 y.o. female coming in with complaint of back and neck pain Patient states doing well. No ne symptoms.         Reviewed prior external information including notes and imaging from previsou exam, outside providers and external EMR if available.   As well as notes that were available from care everywhere and other healthcare systems.  Past medical history, social, surgical and family history all reviewed in electronic medical record.  No pertanent information unless stated regarding to the chief complaint.   Past Medical History:  Diagnosis Date   Allergy    Anxiety    Depression    PCOS (polycystic ovarian syndrome) 11/2016      Objective  Blood pressure 114/80, pulse 84, height 5' 6 (1.676 m), weight 197 lb (89.4 kg), SpO2 98%.   General: No apparent distress alert and oriented x3 mood and affect normal, dressed appropriately.  HEENT: Pupils equal, extraocular movements intact  Respiratory: Patient's speak in full sentences and does not appear short of breath  Cardiovascular: No lower extremity edema, non tender, no erythema  Gait relatively normal MSK:  Back does have some loss of lordosis noted.  Some tenderness to palpation in the paraspinal musculature.  Right greater than the left arm positive Deri.  Neck exam some limited sidebending bilaterally.  Osteopathic findings  C2 flexed rotated and side bent right T3 extended rotated and side bent right inhaled rib T9 extended rotated and side bent left L2 flexed rotated and side bent right L4 flexed rotated and side bent left Sacrum right on right       Assessment and Plan:  SI  (sacroiliac) joint dysfunction Discussed HEP Discussed which activities to do and which ones to avoid.  Continue work on core strengthening as well.  Increase activity slowly.  Discussed icing regimen.  Follow-up again in 6 to 8 weeks.  No other changes in medication today. Continue to walk her dog regularly  Nonallopathic problems  Decision today to treat with OMT was based on Physical Exam  After verbal consent patient was treated with HVLA, ME, FPR techniques in cervical, rib, thoracic, lumbar, and sacral  areas  Patient tolerated the procedure well with improvement in symptoms  Patient given exercises, stretches and lifestyle modifications  See medications in patient instructions if given  Patient will follow up in 4-8 weeks     The above documentation has been reviewed and is accurate and complete Donnell Wion M Raden Byington, DO         Note: This dictation was prepared with Dragon dictation along with smaller phrase technology. Any transcriptional errors that result from this process are unintentional.

## 2024-02-24 ENCOUNTER — Encounter: Payer: Self-pay | Admitting: Family Medicine

## 2024-02-24 ENCOUNTER — Ambulatory Visit (INDEPENDENT_AMBULATORY_CARE_PROVIDER_SITE_OTHER): Payer: Self-pay | Admitting: Family Medicine

## 2024-02-24 VITALS — BP 114/80 | HR 84 | Ht 66.0 in | Wt 197.0 lb

## 2024-02-24 DIAGNOSIS — M9901 Segmental and somatic dysfunction of cervical region: Secondary | ICD-10-CM

## 2024-02-24 DIAGNOSIS — M9903 Segmental and somatic dysfunction of lumbar region: Secondary | ICD-10-CM

## 2024-02-24 DIAGNOSIS — M9904 Segmental and somatic dysfunction of sacral region: Secondary | ICD-10-CM

## 2024-02-24 DIAGNOSIS — M9902 Segmental and somatic dysfunction of thoracic region: Secondary | ICD-10-CM | POA: Diagnosis not present

## 2024-02-24 DIAGNOSIS — M9908 Segmental and somatic dysfunction of rib cage: Secondary | ICD-10-CM | POA: Diagnosis not present

## 2024-02-24 DIAGNOSIS — M533 Sacrococcygeal disorders, not elsewhere classified: Secondary | ICD-10-CM | POA: Diagnosis not present

## 2024-02-24 NOTE — Assessment & Plan Note (Signed)
 Discussed HEP Discussed which activities to do and which ones to avoid.  Continue work on core strengthening as well.  Increase activity slowly.  Discussed icing regimen.  Follow-up again in 6 to 8 weeks.  No other changes in medication today.

## 2024-02-24 NOTE — Patient Instructions (Signed)
 Good to see you! Good luck with the season See you again in 6-8 weeks

## 2024-02-27 ENCOUNTER — Other Ambulatory Visit: Payer: Self-pay

## 2024-02-27 ENCOUNTER — Other Ambulatory Visit: Payer: Self-pay | Admitting: Family Medicine

## 2024-02-27 ENCOUNTER — Other Ambulatory Visit (HOSPITAL_COMMUNITY): Payer: Self-pay

## 2024-02-27 MED ORDER — TIZANIDINE HCL 4 MG PO TABS
4.0000 mg | ORAL_TABLET | Freq: Every day | ORAL | 0 refills | Status: AC
  Filled 2024-02-27 – 2024-03-31 (×2): qty 30, 30d supply, fill #0

## 2024-03-10 ENCOUNTER — Other Ambulatory Visit (HOSPITAL_COMMUNITY): Payer: Self-pay

## 2024-03-13 NOTE — Progress Notes (Deleted)
    Ben Jackson D.CLEMENTEEN [facility] [address] [place] [address] Phone: [phone]   Assessment and Plan:     ***    Pertinent previous records reviewed include ***   Follow Up: ***     Subjective:   I, Chestine Reeves, am serving as a Neurosurgeon for Doctor Morene Mace  Chief Complaint: neck pain  HPI:   12/31/2022 Cheryl [name] is a 23 y.o. female coming in with complaint of back and neck pain. OMT 11/14/2022. Patient states no new concerns. Same per usual.   03/16/2024 Patient is a 22 year old female with neck pain. Patient states   Relevant Historical Information: ***  Additional pertinent review of systems negative.   Current Outpatient Medications:    ALPRAZolam  (XANAX ) 0.5 MG tablet, Take 1 tablet (0.5 mg total) by mouth as needed., Disp: 30 tablet, Rfl: 2   cloNIDine  (CATAPRES ) 0.1 MG tablet, Take 1 tablet (0.1 mg total) by mouth at bedtime., Disp: 30 tablet, Rfl: 11   desvenlafaxine  (PRISTIQ ) 100 MG 24 hr tablet, Take 1 tablet (100 mg total) by mouth daily., Disp: 30 tablet, Rfl: 3   EPINEPHrine 0.3 mg/0.3 mL IJ SOAJ injection, , Disp: , Rfl: 1   fluticasone  (FLONASE ) 50 MCG/ACT nasal spray, Place 2 sprays into both nostrils daily., Disp: 16 g, Rfl: 0   loratadine -pseudoephedrine (CLARITIN -D 24 HOUR) 10-240 MG 24 hr tablet, Take 1 tablet by mouth daily., Disp: 30 tablet, Rfl: 0   tiZANidine  (ZANAFLEX ) 4 MG tablet, Take 1 tablet (4 mg total) by mouth at bedtime., Disp: 30 tablet, Rfl: 0   traZODone  (DESYREL ) 100 MG tablet, Take 1 tablet (100 mg total) by mouth at bedtime., Disp: 30 tablet, Rfl: 2   Objective:     There were no vitals filed for this visit.    There is no height or weight on file to calculate BMI.    Physical Exam:    ***   Electronically signed by:  Odis Mace D.[facility] 7:32 AM 03/13/24

## 2024-03-16 ENCOUNTER — Ambulatory Visit: Admitting: Sports Medicine

## 2024-03-24 ENCOUNTER — Encounter: Payer: Self-pay | Admitting: Behavioral Health

## 2024-03-24 ENCOUNTER — Telehealth (INDEPENDENT_AMBULATORY_CARE_PROVIDER_SITE_OTHER): Admitting: Behavioral Health

## 2024-03-24 ENCOUNTER — Other Ambulatory Visit (HOSPITAL_COMMUNITY): Payer: Self-pay

## 2024-03-24 DIAGNOSIS — F902 Attention-deficit hyperactivity disorder, combined type: Secondary | ICD-10-CM

## 2024-03-24 MED ORDER — METHYLPHENIDATE HCL ER (OSM) 18 MG PO TBCR
18.0000 mg | EXTENDED_RELEASE_TABLET | Freq: Every day | ORAL | 0 refills | Status: AC
  Filled 2024-03-24: qty 30, 30d supply, fill #0

## 2024-03-24 NOTE — Progress Notes (Signed)
 Cheryl [name] [id number] 09-04-2000 23 y.o.  Virtual Visit via Video Note  I connected with pt @ on 03/24/24 at 11:00 AM EDT by a video enabled telemedicine application and verified that I am speaking with the correct person using two identifiers.   I discussed the limitations of evaluation and management by telemedicine and the availability of in person appointments. The patient expressed understanding and agreed to proceed.  I discussed the assessment and treatment plan with the patient. The patient was provided an opportunity to ask questions and all were answered. The patient agreed with the plan and demonstrated an understanding of the instructions.   The patient was advised to call back or seek an in-person evaluation if the symptoms worsen or if the condition fails to improve as anticipated.  I provided 30 minutes of non-face-to-face time during this encounter.  The patient was located at home.  The provider was located at [facility].   Redell DELENA Pizza, NP   Subjective:   Patient ID:  Cheryl [name] is a 23 y.o. (DOB 2000-12-24) female.  Chief Complaint:  Chief Complaint  Patient presents with   Anxiety   Depression   Follow-up   Medication Refill   Patient Education   ADHD   Panic Attack    HPI  [place], 23 year old female presents to this office follow up and medication management. Collateral information should be considered reliable. Still reporting moderate improvement with Pristiq  but still having undesirable levels of anxiety and panic. Having panic attack about every 10-14 days. Last less than 20 min. Depression is much improved with increase of Pristiq . Requesting restart of Concerta  as school will be starting in 2 weeks.  She numerically rates her depression at 3/10, and anxiety at  5/10.  Denies any current mania, no psychosis, no auditory or visual hallucinations.  Denies SI or HI.   Prior psychiatric medication  trials: Prozac  Trazodone  Concerta  Wellbutrin  Vyvanse        Review of Systems:  Review of Systems  Constitutional: Negative.   Allergic/Immunologic: Negative.   Neurological: Negative.   Psychiatric/Behavioral:  Positive for behavioral problems. The patient is nervous/anxious.     Medications: I have reviewed the patient's current medications.  Current Outpatient Medications  Medication Sig Dispense Refill   methylphenidate  (CONCERTA ) 18 MG PO CR tablet Take 1 tablet (18 mg total) by mouth daily. 30 tablet 0   ALPRAZolam  (XANAX ) 0.5 MG tablet Take 1 tablet (0.5 mg total) by mouth as needed. 30 tablet 2   cloNIDine  (CATAPRES ) 0.1 MG tablet Take 1 tablet (0.1 mg total) by mouth at bedtime. 30 tablet 11   desvenlafaxine  (PRISTIQ ) 100 MG 24 hr tablet Take 1 tablet (100 mg total) by mouth daily. 30 tablet 3   EPINEPHrine 0.3 mg/0.3 mL IJ SOAJ injection   1   fluticasone  (FLONASE ) 50 MCG/ACT nasal spray Place 2 sprays into both nostrils daily. 16 g 0   loratadine -pseudoephedrine (CLARITIN -D 24 HOUR) 10-240 MG 24 hr tablet Take 1 tablet by mouth daily. 30 tablet 0   tiZANidine  (ZANAFLEX ) 4 MG tablet Take 1 tablet (4 mg total) by mouth at bedtime. 30 tablet 0   traZODone  (DESYREL ) 100 MG tablet Take 1 tablet (100 mg total) by mouth at bedtime. 30 tablet 2   No current facility-administered medications for this visit.    Medication Side Effects: None  Allergies: No Known Allergies  Past Medical History:  Diagnosis Date   Allergy    Anxiety    Depression  PCOS (polycystic ovarian syndrome) 11/2016    Family History  Problem Relation Age of Onset   Depression Mother    Anxiety disorder Mother    Hyperlipidemia Mother    Hypertension Mother    Hypertension Father    Alcohol abuse Sister    Depression Maternal Uncle    Hypertension Maternal Grandfather    Drug abuse Maternal Grandfather    Hypertension Maternal Grandmother     Social History   Socioeconomic  History   Marital status: Single    Spouse name: n/a   Number of children: 0   Years of education: 15   Highest education level: High school graduate  Occupational History   Occupation: Dentist:  minor    Comment: [employer] Early Education officer, environmental at [employer]   Occupation: Health and safety inspector: Production assistant, radio FOR SELF EMPLOYED  Tobacco Use   Smoking status: Former    Current packs/day: 0.00    Types: Cigarettes    Quit date: 10/21/2017    Years since quitting: 6.4   Smokeless tobacco: Never  Vaping Use   Vaping status: Some Days  Substance and Sexual Activity   Alcohol use: No    Comment: 4 drinks/coctails per week   Drug use: No   Sexual activity: Not Currently  Other Topics Concern   Not on file  Social History Narrative   Living alone house alone in [place].  Experiencing anhedonia, no hobbies right now.    Social Drivers of Corporate investment banker Strain: Not on file  Food Insecurity: No Food Insecurity (11/19/2017)   Hunger Vital Sign    Worried About Running Out of Food in the Last Year: Never true    Ran Out of Food in the Last Year: Never true  Transportation Needs: No Transportation Needs (11/19/2017)   PRAPARE - Administrator, Civil Service (Medical): No    Lack of Transportation (Non-Medical): No  Physical Activity: Insufficiently Active (11/19/2017)   Exercise Vital Sign    Days of Exercise per Week: 1 day    Minutes of Exercise per Session: 30 min  Stress: Stress Concern Present (11/19/2017)   [employer] of Occupational Health - Occupational Stress Questionnaire    Feeling of Stress : Rather much  Social Connections: Moderately Integrated (11/19/2017)   Social Connection and Isolation Panel    Frequency of Communication with Friends and Family: More than three times a week    Frequency of Social Gatherings with Friends and Family: More than three times a week    Attends Religious Services: More than 4 times per year    Active  Member of [employer] or Organizations: Yes    Attends Banker Meetings: More than 4 times per year    Marital Status: Never married  Intimate Partner Violence: Not At Risk (11/19/2017)   Humiliation, Afraid, Rape, and Kick questionnaire    Fear of Current or Ex-Partner: No    Emotionally Abused: No    Physically Abused: No    Sexually Abused: No    Past Medical History, Surgical history, Social history, and Family history were reviewed and updated as appropriate.   Please see review of systems for further details on the patient's review from today.   Objective:   Physical Exam:  There were no vitals taken for this visit.  Physical Exam Neurological:     Mental Status: She is alert and oriented to person, place, and time.  Psychiatric:        Attention and Perception: Attention and perception normal.        Mood and Affect: Mood normal.        Speech: Speech normal.        Behavior: Behavior normal. Behavior is cooperative.        Cognition and Memory: Cognition and memory normal.        Judgment: Judgment normal.     Comments: Insight intact     Lab Review:     Component Value Date/Time   NA 135 07/19/2022 1329   K 4.2 07/19/2022 1329   CL 101 07/19/2022 1329   CO2 28 07/19/2022 1329   GLUCOSE 80 07/19/2022 1329   BUN 10 07/19/2022 1329   CREATININE 0.87 07/19/2022 1329   CREATININE 0.85 01/16/2019 1503   CALCIUM  9.3 07/19/2022 1329   PROT 7.7 07/19/2022 1329   ALBUMIN 4.1 07/19/2022 1329   AST 16 07/19/2022 1329   ALT 14 07/19/2022 1329   ALKPHOS 41 07/19/2022 1329   BILITOT 0.3 07/19/2022 1329       Component Value Date/Time   WBC 6.7 12/08/2020 1032   RBC 5.20 (H) 12/08/2020 1032   HGB 14.4 12/08/2020 1032   HCT 43.3 12/08/2020 1032   PLT 258.0 12/08/2020 1032   MCV 83.2 12/08/2020 1032   MCH 27.0 01/16/2019 1503   MCHC 33.4 12/08/2020 1032   RDW 14.2 12/08/2020 1032   LYMPHSABS 1.6 12/08/2020 1032   MONOABS 0.4 12/08/2020 1032   EOSABS 0.1  12/08/2020 1032   BASOSABS 0.0 12/08/2020 1032    No results found for: POCLITH, LITHIUM   No results found for: PHENYTOIN, PHENOBARB, VALPROATE, CBMZ   .res Assessment: Plan:    Greater than 50% of  30 min face to face time with patient was spent on counseling and coordination of care. Discussed her moderate improvement with Pristiq  Depression is reported markedly improved.  Discussed her desire to go back on Concerta  and risk of worsening anxiety.  Counseled her on anti anxiety medications that are typically contraindicated with stimulants like benzo's. She agrees that since school is starting back that she will prioritize her ADHD  and restart Concerta  for now.     We agreed today to:   Restart Concerta  18 mg daily after breakfast Continue  Pristiq  to 100 mg daily in the a.m. after breakfast To continue  trazodone  to 100 mg daily at bedtime To continue clonidine  0.1 mg daily at bedtime To continue Xanax  0.5 mg daily as needed for severe anxiety or panic Will report worsening symptoms or side effects promptly Will follow-up in 6 weeks to reassess Having periods,  No BC. Counseled on risk of pregnancy while using certain psychiatric medication.  Reviewed PDMP Discussed potential benefits, risk, and side effects of benzodiazepines to include potential risk of tolerance and dependence, as well as possible drowsiness.  Advised patient not to drive if experiencing drowsiness and to take lowest possible effective dose to minimize risk of dependence and tolerance.     Redell DELENA Pizza, NP          Salvatore was seen today for anxiety, depression, follow-up, medication refill, patient education, adhd and panic attack.  Diagnoses and all orders for this visit:  Attention deficit hyperactivity disorder (ADHD), combined type -     methylphenidate  (CONCERTA ) 18 MG PO CR tablet; Take 1 tablet (18 mg total) by mouth daily.     Please see After Visit Summary for patient  specific instructions.  Future Appointments  Date Time Provider Department Center  04/06/2024  1:30 PM Claudene Arthea HERO, DO [facility] None    No orders of the defined types were placed in this encounter.     -------------------------------

## 2024-03-31 ENCOUNTER — Other Ambulatory Visit: Payer: Self-pay

## 2024-03-31 ENCOUNTER — Other Ambulatory Visit (HOSPITAL_COMMUNITY): Payer: Self-pay

## 2024-03-31 DIAGNOSIS — Z6831 Body mass index (BMI) 31.0-31.9, adult: Secondary | ICD-10-CM | POA: Diagnosis not present

## 2024-03-31 DIAGNOSIS — Z713 Dietary counseling and surveillance: Secondary | ICD-10-CM | POA: Diagnosis not present

## 2024-04-01 ENCOUNTER — Encounter: Payer: Self-pay | Admitting: Sports Medicine

## 2024-04-02 NOTE — Progress Notes (Signed)
  [name] [address] [place] [address] Phone: [phone] Subjective:   ISusannah [name], am serving as a scribe for Dr. Arthea Claudene.  I'm seeing this patient by the request  of:  Mahlon Comer BRAVO, MD  CC: Back and neck pain follow-u p YEP:Cheryl [name]  [name] is a 23 y.o. female coming in with complaint of back and neck pain. OMT on 02/24/2024. Patient states R side of the neck has been bothersome. No new concerns.  Medications patient has been prescribed: Zanaflex   Taking:Yes         Reviewed prior external information including notes and imaging from previsou exam, outside providers and external EMR if available.   As well as notes that were available from care everywhere and other healthcare systems.  Past medical history, social, surgical and family history all reviewed in electronic medical record.  No pertanent information unless stated regarding to the chief complaint.   Past Medical History:  Diagnosis Date   Allergy    Anxiety    Depression    PCOS (polycystic ovarian syndrome) 11/2016    No Known Allergies   Review of Systems:  No headache, visual changes, nausea, vomiting, diarrhea, constipation, dizziness, abdominal pain, skin rash, fevers, chills, night sweats, weight loss, swollen lymph nodes, body aches, joint swelling, chest pain, shortness of breath, mood changes. POSITIVE muscle aches  Objective  Blood pressure 122/84, pulse (!) 111, height 5' 6 (1.676 m), weight 189 lb (85.7 kg), SpO2 97%.   General: No apparent distress alert and oriented x3 mood and affect normal, dressed appropriately.  HEENT: Pupils equal, extraocular movements intact  Respiratory: Patient's speak in full sentences and does not appear short of breath  Cardiovascular: No lower extremity edema, non tender, no erythema  Gait MSK:  Back does have some loss lordosis noted.  Tightness noted in the lower back.  Seems to be more  of the hip flexors.  Osteopathic findings  C2 flexed rotated and side bent right C6 flexed rotated and side bent left T3 extended rotated and side bent right inhaled rib T9 extended rotated and side bent left L2 flexed rotated and side bent right L3 F RS right  Sacrum right on right    Assessment and Plan:  Neck muscle spasm Worsening symptoms today.  Toradol  and Depo-Medrol  injection given.  Unable noted today.  Discussed icing regimen and home exercises, difficult with the sacroiliac joint is normal and congruent but more tightness of the musculature.  Increase activity slowly.  Discussed icing regimen.  Follow-up with me again in 6 to 8 weeks otherwise.    Nonallopathic problems  Decision today to treat with OMT was based on Physical Exam  After verbal consent patient was treated with HVLA, ME, FPR techniques in cervical, rib, thoracic, lumbar, and sacral  areas  Patient tolerated the procedure well with improvement in symptoms  Patient given exercises, stretches and lifestyle modifications  See medications in patient instructions if given  Patient will follow up in 4-8 weeks      The above documentation has been reviewed and is accurate and complete Estevan Kersh M Jamari Moten, DO        Note: This dictation was prepared with Dragon dictation along with smaller phrase technology. Any transcriptional errors that result from this process are unintentional.

## 2024-04-06 ENCOUNTER — Encounter: Payer: Self-pay | Admitting: Family Medicine

## 2024-04-06 ENCOUNTER — Ambulatory Visit: Admitting: Family Medicine

## 2024-04-06 VITALS — BP 122/84 | HR 111 | Ht 66.0 in | Wt 189.0 lb

## 2024-04-06 DIAGNOSIS — M9908 Segmental and somatic dysfunction of rib cage: Secondary | ICD-10-CM

## 2024-04-06 DIAGNOSIS — M9901 Segmental and somatic dysfunction of cervical region: Secondary | ICD-10-CM | POA: Diagnosis not present

## 2024-04-06 DIAGNOSIS — M9902 Segmental and somatic dysfunction of thoracic region: Secondary | ICD-10-CM | POA: Diagnosis not present

## 2024-04-06 DIAGNOSIS — M9903 Segmental and somatic dysfunction of lumbar region: Secondary | ICD-10-CM | POA: Diagnosis not present

## 2024-04-06 DIAGNOSIS — M62838 Other muscle spasm: Secondary | ICD-10-CM | POA: Diagnosis not present

## 2024-04-06 DIAGNOSIS — M9904 Segmental and somatic dysfunction of sacral region: Secondary | ICD-10-CM

## 2024-04-06 MED ORDER — KETOROLAC TROMETHAMINE 60 MG/2ML IM SOLN
60.0000 mg | Freq: Once | INTRAMUSCULAR | Status: AC
  Administered 2024-04-06: 60 mg via INTRAMUSCULAR

## 2024-04-06 MED ORDER — METHYLPREDNISOLONE ACETATE 80 MG/ML IJ SUSP
80.0000 mg | Freq: Once | INTRAMUSCULAR | Status: AC
  Administered 2024-04-06: 80 mg via INTRAMUSCULAR

## 2024-04-06 NOTE — Assessment & Plan Note (Addendum)
 Worsening symptoms today.  Toradol  and Depo-Medrol  injection given.  Unable noted today.  Discussed icing regimen and home exercises, difficult with the sacroiliac joint is normal and congruent but more tightness of the musculature.  Increase activity slowly.  Discussed icing regimen.  Follow-up with me again in 6 to 8 weeks otherwise.

## 2024-04-06 NOTE — Patient Instructions (Signed)
 See you again in 2 months Cocktail injection today

## 2024-04-14 ENCOUNTER — Other Ambulatory Visit (HOSPITAL_COMMUNITY): Payer: Self-pay

## 2024-04-16 ENCOUNTER — Other Ambulatory Visit (HOSPITAL_COMMUNITY): Payer: Self-pay

## 2024-04-17 ENCOUNTER — Other Ambulatory Visit (HOSPITAL_COMMUNITY): Payer: Self-pay

## 2024-04-17 MED ORDER — CLINDAMYCIN PHOSPHATE 1 % EX LOTN
TOPICAL_LOTION | CUTANEOUS | 2 refills | Status: AC
  Filled 2024-04-17: qty 60, 30d supply, fill #0

## 2024-04-27 ENCOUNTER — Other Ambulatory Visit (HOSPITAL_COMMUNITY): Payer: Self-pay

## 2024-04-27 DIAGNOSIS — L700 Acne vulgaris: Secondary | ICD-10-CM | POA: Diagnosis not present

## 2024-04-27 DIAGNOSIS — L738 Other specified follicular disorders: Secondary | ICD-10-CM | POA: Diagnosis not present

## 2024-04-27 MED ORDER — CEPHALEXIN 500 MG PO CAPS
500.0000 mg | ORAL_CAPSULE | Freq: Two times a day (BID) | ORAL | 0 refills | Status: AC
  Filled 2024-04-27: qty 60, 30d supply, fill #0

## 2024-05-04 ENCOUNTER — Other Ambulatory Visit (HOSPITAL_COMMUNITY): Payer: Self-pay

## 2024-05-04 ENCOUNTER — Other Ambulatory Visit: Payer: Self-pay

## 2024-05-06 ENCOUNTER — Other Ambulatory Visit (HOSPITAL_COMMUNITY): Payer: Self-pay

## 2024-06-01 NOTE — Progress Notes (Unsigned)
°  Cheryl [name] [address] [place] [address] Phone: [phone] Subjective:   [name] am a scribe for Dr. Claudene.   I'm seeing this patient by the request  of:  Mahlon Comer BRAVO, MD  CC: Back and neck pain follow-up  Cheryl [name]  Cheryl [name] is a 23 y.o. female coming in with complaint of back and neck pain. OMT on 04/06/2024. Patient states that the back and neck are doing alright.   Medications patient has been prescribed: zanaflex   Taking: Yes as needed         Reviewed prior external information including notes and imaging from previsou exam, outside providers and external EMR if available.   As well as notes that were available from care everywhere and other healthcare systems.  Past medical history, social, surgical and family history all reviewed in electronic medical record.  No pertanent information unless stated regarding to the chief complaint.   Past Medical History:  Diagnosis Date   Allergy    Anxiety    Depression    PCOS (polycystic ovarian syndrome) 11/2016    Allergies[1]   Review of Systems:  No headache, visual changes, nausea, vomiting, diarrhea, constipation, dizziness, abdominal pain, skin rash, fevers, chills, night sweats, weight loss, swollen lymph nodes, body aches, joint swelling, chest pain, shortness of breath, mood changes. POSITIVE muscle aches  Objective  Blood pressure 122/60, pulse (!) 105, height 5' 6 (1.676 m), weight 201 lb (91.2 kg), SpO2 99%.   General: No apparent distress alert and oriented x3 mood and affect normal, dressed appropriately.  HEENT: Pupils equal, extraocular movements intact  Respiratory: Patient's speak in full sentences and does not appear short of breath  Cardiovascular: No lower extremity edema, non tender, no erythema  Gait MSK:  Back does have some mild loss of lordosis.  Patient still has tightness noted more on the left side of the  parascapular area.  Patient does not have any true  Osteopathic findings  C2 flexed rotated and side bent right C6 flexed rotated and side bent left C7 flexed rotated and side bent right T7 extended rotated and side bent right inhaled rib T11 extended rotated and side bent left L2 flexed rotated and side bent right L5 flexed rotated and side bent left Sacrum right on right    Assessment and Plan:  SI (sacroiliac) joint dysfunction Low back pain seems to be doing somewhat better.  Still having some neck pain.  Discussed icing regimen and home exercises, increase activity slowly.  Discussed icing regimen.  Follow-up again in 6 to 12 weeks.    Nonallopathic problems  Decision today to treat with OMT was based on Physical Exam  After verbal consent patient was treated with HVLA, ME, FPR techniques in cervical, rib, thoracic, lumbar, and sacral  areas  Patient tolerated the procedure well with improvement in symptoms  Patient given exercises, stretches and lifestyle modifications  See medications in patient instructions if given  Patient will follow up in 4-8 weeks     The above documentation has been reviewed and is accurate and complete Trentyn Boisclair M Ricco Dershem, DO         Note: This dictation was prepared with Dragon dictation along with smaller phrase technology. Any transcriptional errors that result from this process are unintentional.            [1] No Known Allergies

## 2024-06-02 ENCOUNTER — Encounter: Payer: Self-pay | Admitting: Family Medicine

## 2024-06-02 ENCOUNTER — Ambulatory Visit: Admitting: Family Medicine

## 2024-06-02 VITALS — BP 122/60 | HR 105 | Ht 66.0 in | Wt 201.0 lb

## 2024-06-02 DIAGNOSIS — M9908 Segmental and somatic dysfunction of rib cage: Secondary | ICD-10-CM | POA: Diagnosis not present

## 2024-06-02 DIAGNOSIS — M9901 Segmental and somatic dysfunction of cervical region: Secondary | ICD-10-CM | POA: Diagnosis not present

## 2024-06-02 DIAGNOSIS — M9903 Segmental and somatic dysfunction of lumbar region: Secondary | ICD-10-CM | POA: Diagnosis not present

## 2024-06-02 DIAGNOSIS — M9902 Segmental and somatic dysfunction of thoracic region: Secondary | ICD-10-CM

## 2024-06-02 DIAGNOSIS — M9904 Segmental and somatic dysfunction of sacral region: Secondary | ICD-10-CM

## 2024-06-02 DIAGNOSIS — M533 Sacrococcygeal disorders, not elsewhere classified: Secondary | ICD-10-CM | POA: Diagnosis not present

## 2024-06-02 NOTE — Assessment & Plan Note (Signed)
 Low back pain seems to be doing somewhat better.  Still having some neck pain.  Discussed icing regimen and home exercises, increase activity slowly.  Discussed icing regimen.  Follow-up again in 6 to 12 weeks.

## 2024-06-02 NOTE — Patient Instructions (Addendum)
 Good to see you. Happy Holidays. See me again in 7 to 8 weeks.

## 2024-06-04 ENCOUNTER — Other Ambulatory Visit: Payer: Self-pay | Admitting: Behavioral Health

## 2024-06-04 ENCOUNTER — Other Ambulatory Visit (HOSPITAL_COMMUNITY): Payer: Self-pay

## 2024-06-04 DIAGNOSIS — F5105 Insomnia due to other mental disorder: Secondary | ICD-10-CM

## 2024-06-04 NOTE — Telephone Encounter (Signed)
 Please contact the pt to schedule her appt. She is over due for one.  Thank you

## 2024-06-05 ENCOUNTER — Other Ambulatory Visit: Payer: Self-pay | Admitting: Behavioral Health

## 2024-06-05 ENCOUNTER — Other Ambulatory Visit (HOSPITAL_COMMUNITY): Payer: Self-pay

## 2024-06-05 ENCOUNTER — Other Ambulatory Visit: Payer: Self-pay

## 2024-06-05 DIAGNOSIS — F5105 Insomnia due to other mental disorder: Secondary | ICD-10-CM

## 2024-06-05 MED ORDER — TRAZODONE HCL 100 MG PO TABS
100.0000 mg | ORAL_TABLET | Freq: Every day | ORAL | 0 refills | Status: DC
  Filled 2024-06-05: qty 30, 30d supply, fill #0

## 2024-06-05 MED ORDER — CEPHALEXIN 500 MG PO CAPS
500.0000 mg | ORAL_CAPSULE | Freq: Two times a day (BID) | ORAL | 0 refills | Status: AC
  Filled 2024-06-05: qty 60, 30d supply, fill #0

## 2024-06-05 NOTE — Telephone Encounter (Signed)
 Please call pt to schedule her next appt.  Thank you

## 2024-06-05 NOTE — Telephone Encounter (Signed)
 Lvm for pt to call and schedule

## 2024-06-05 NOTE — Telephone Encounter (Signed)
 LVM to schedule f/u

## 2024-06-25 ENCOUNTER — Other Ambulatory Visit (HOSPITAL_COMMUNITY): Payer: Self-pay

## 2024-06-25 MED ORDER — NUVARING 0.12-0.015 MG/24HR VA RING
VAGINAL_RING | VAGINAL | 12 refills | Status: AC
  Filled 2024-06-25 – 2024-07-08 (×2): qty 1, 28d supply, fill #0

## 2024-07-04 ENCOUNTER — Other Ambulatory Visit (HOSPITAL_COMMUNITY): Payer: Self-pay

## 2024-07-08 ENCOUNTER — Other Ambulatory Visit: Payer: Self-pay

## 2024-07-08 ENCOUNTER — Other Ambulatory Visit: Payer: Self-pay | Admitting: Behavioral Health

## 2024-07-08 ENCOUNTER — Other Ambulatory Visit (HOSPITAL_COMMUNITY): Payer: Self-pay

## 2024-07-08 DIAGNOSIS — F5105 Insomnia due to other mental disorder: Secondary | ICD-10-CM

## 2024-07-08 MED ORDER — CLINDAMYCIN PHOSPHATE 1 % EX LOTN
TOPICAL_LOTION | CUTANEOUS | 2 refills | Status: AC
  Filled 2024-07-08: qty 60, 30d supply, fill #0

## 2024-07-08 MED ORDER — ALPRAZOLAM 0.5 MG PO TABS
0.5000 mg | ORAL_TABLET | ORAL | 0 refills | Status: AC | PRN
  Filled 2024-07-08: qty 30, 30d supply, fill #0

## 2024-07-08 MED ORDER — MUPIROCIN 2 % EX OINT
TOPICAL_OINTMENT | CUTANEOUS | 2 refills | Status: AC
  Filled 2024-07-08: qty 22, 30d supply, fill #0

## 2024-07-08 MED ORDER — DESVENLAFAXINE SUCCINATE ER 100 MG PO TB24
100.0000 mg | ORAL_TABLET | Freq: Every day | ORAL | 3 refills | Status: AC
  Filled 2024-07-08: qty 30, 30d supply, fill #0

## 2024-07-08 MED ORDER — TRAZODONE HCL 100 MG PO TABS
100.0000 mg | ORAL_TABLET | Freq: Every day | ORAL | 0 refills | Status: AC
  Filled 2024-07-08: qty 30, 30d supply, fill #0

## 2024-07-08 MED ORDER — DOXYCYCLINE HYCLATE 100 MG PO TABS
100.0000 mg | ORAL_TABLET | Freq: Two times a day (BID) | ORAL | 1 refills | Status: AC
  Filled 2024-07-08: qty 60, 30d supply, fill #0

## 2024-07-08 NOTE — Telephone Encounter (Signed)
 Pt made an appt for  07/30/24 Please fill scripts she has been out for days

## 2024-07-15 NOTE — Progress Notes (Unsigned)
" °  [name] [address] [place] [address] Phone: [phone] Subjective:   Cheryl [name], am serving as a scribe for Dr. Arthea Claudene.  I'm seeing this patient by the request  of:  Mahlon Comer BRAVO, MD  CC: Neck and back pain follow-up  [name]  Cheryl [name] is a 24 y.o. female coming in with complaint of back and neck pain. OMT on 06/02/2024. Patient states that she does feel pain in her neck with sleeping but otherwise better than usual.   Medications patient has been prescribed:   Taking:         Reviewed prior external information including notes and imaging from previsou exam, outside providers and external EMR if available.   As well as notes that were available from care everywhere and other healthcare systems.  Past medical history, social, surgical and family history all reviewed in electronic medical record.  No pertanent information unless stated regarding to the chief complaint.   Past Medical History:  Diagnosis Date   Allergy    Anxiety    Depression    PCOS (polycystic ovarian syndrome) 11/2016    Allergies[1]   Review of Systems:  No headache, visual changes, nausea, vomiting, diarrhea, constipation, dizziness, abdominal pain, skin rash, fevers, chills, night sweats, weight loss, swollen lymph nodes, body aches, joint swelling, chest pain, shortness of breath, mood changes. POSITIVE muscle aches  Objective  Blood pressure 112/78, pulse 95, height 5' 6 (1.676 m), weight 199 lb (90.3 kg), SpO2 99%.   General: No apparent distress alert and oriented x3 mood and affect normal, dressed appropriately.  HEENT: Pupils equal, extraocular movements intact  Respiratory: Patient's speak in full sentences and does not appear short of breath  Cardiovascular: No lower extremity edema, non tender, no erythema  Gait relatively normal MSK:  Back does have some loss lordosis.  Tightness noted in the paraspinal  musculature.  Seems to have a positive Faber bilaterally.  Neck exam also has tightness especially with left sided sidebending.  Osteopathic findings C6 flexed rotated and side bent left T3 extended rotated and side bent left inhaled rib T9 extended rotated and side bent left L2 flexed rotated and side bent right L4 flexed rotated and side bent left Sacrum right on right     Assessment and Plan:  SI (sacroiliac) joint dysfunction Chronic, with exacerbation.  Discussed icing regimen and home exercises, discussed which activities to do and which ones to avoid.  Increase activity slowly.  Discussed icing regimen.  Patient has done fairly well with the conservative therapy.  Will follow-up again in 6 to 12 weeks.    Nonallopathic problems  Decision today to treat with OMT was based on Physical Exam  After verbal consent patient was treated with HVLA, ME, FPR techniques in cervical, rib, thoracic, lumbar, and sacral  areas  Patient tolerated the procedure well with improvement in symptoms  Patient given exercises, stretches and lifestyle modifications  See medications in patient instructions if given  Patient will follow up in 4-8 weeks    The above documentation has been reviewed and is accurate and complete Audriana Aldama M Ralphael [place], DO          Note: This dictation was prepared with Dragon dictation along with smaller phrase technology. Any transcriptional errors that result from this process are unintentional.            [1] No Known Allergies  "

## 2024-07-16 ENCOUNTER — Encounter: Payer: Self-pay | Admitting: Family Medicine

## 2024-07-16 ENCOUNTER — Ambulatory Visit: Admitting: Family Medicine

## 2024-07-16 VITALS — BP 112/78 | HR 95 | Ht 66.0 in | Wt 199.0 lb

## 2024-07-16 DIAGNOSIS — M9904 Segmental and somatic dysfunction of sacral region: Secondary | ICD-10-CM | POA: Diagnosis not present

## 2024-07-16 DIAGNOSIS — M9908 Segmental and somatic dysfunction of rib cage: Secondary | ICD-10-CM

## 2024-07-16 DIAGNOSIS — M9901 Segmental and somatic dysfunction of cervical region: Secondary | ICD-10-CM | POA: Diagnosis not present

## 2024-07-16 DIAGNOSIS — M9902 Segmental and somatic dysfunction of thoracic region: Secondary | ICD-10-CM

## 2024-07-16 DIAGNOSIS — M533 Sacrococcygeal disorders, not elsewhere classified: Secondary | ICD-10-CM | POA: Diagnosis not present

## 2024-07-16 DIAGNOSIS — M9903 Segmental and somatic dysfunction of lumbar region: Secondary | ICD-10-CM | POA: Diagnosis not present

## 2024-07-16 NOTE — Patient Instructions (Signed)
 Have fun in snow See me in 2 months

## 2024-07-16 NOTE — Assessment & Plan Note (Signed)
 Chronic, with exacerbation.  Discussed icing regimen and home exercises, discussed which activities to do and which ones to avoid.  Increase activity slowly.  Discussed icing regimen.  Patient has done fairly well with the conservative therapy.  Will follow-up again in 6 to 12 weeks.

## 2024-07-20 ENCOUNTER — Encounter: Admitting: Family Medicine

## 2024-07-20 ENCOUNTER — Other Ambulatory Visit (HOSPITAL_COMMUNITY): Payer: Self-pay

## 2024-07-30 ENCOUNTER — Telehealth: Admitting: Behavioral Health

## 2024-09-07 ENCOUNTER — Ambulatory Visit: Admitting: Family Medicine
